# Patient Record
Sex: Female | Born: 1945
Health system: Southern US, Community
[De-identification: ages and names within clinical notes are randomized; demographics above are authoritative.]

## PROBLEM LIST (undated history)

## (undated) DIAGNOSIS — I10 Essential (primary) hypertension: Secondary | ICD-10-CM

## (undated) DIAGNOSIS — M1990 Unspecified osteoarthritis, unspecified site: Secondary | ICD-10-CM

## (undated) DIAGNOSIS — I890 Lymphedema, not elsewhere classified: Secondary | ICD-10-CM

## (undated) DIAGNOSIS — J449 Chronic obstructive pulmonary disease, unspecified: Secondary | ICD-10-CM

## (undated) DIAGNOSIS — I272 Pulmonary hypertension, unspecified: Secondary | ICD-10-CM

## (undated) HISTORY — DX: Lymphedema, not elsewhere classified: I89.0

## (undated) HISTORY — PX: PARS PLANA VITRECTOMY W/ REPAIR OF MACULAR HOLE: SHX2170

## (undated) HISTORY — PX: TUBAL LIGATION: SHX77

## (undated) HISTORY — PX: OTHER SURGICAL HISTORY: SHX169

---

## 2005-01-14 ENCOUNTER — Ambulatory Visit: Payer: Self-pay | Admitting: Unknown Physician Specialty

## 2006-02-24 ENCOUNTER — Ambulatory Visit: Payer: Self-pay | Admitting: Unknown Physician Specialty

## 2007-03-25 ENCOUNTER — Ambulatory Visit: Payer: Self-pay | Admitting: Unknown Physician Specialty

## 2007-11-12 ENCOUNTER — Emergency Department: Payer: Self-pay

## 2007-11-12 ENCOUNTER — Other Ambulatory Visit: Payer: Self-pay

## 2007-11-12 ENCOUNTER — Inpatient Hospital Stay: Payer: Self-pay | Admitting: Internal Medicine

## 2008-04-20 ENCOUNTER — Ambulatory Visit: Payer: Self-pay | Admitting: Unknown Physician Specialty

## 2008-10-14 ENCOUNTER — Emergency Department: Payer: Self-pay | Admitting: Unknown Physician Specialty

## 2009-06-26 ENCOUNTER — Ambulatory Visit: Payer: Self-pay | Admitting: Unknown Physician Specialty

## 2009-12-14 ENCOUNTER — Emergency Department: Payer: Self-pay | Admitting: Unknown Physician Specialty

## 2010-09-05 ENCOUNTER — Ambulatory Visit: Payer: Self-pay | Admitting: Unknown Physician Specialty

## 2011-01-07 ENCOUNTER — Emergency Department: Payer: Self-pay | Admitting: *Deleted

## 2011-02-24 ENCOUNTER — Emergency Department: Payer: Self-pay | Admitting: Emergency Medicine

## 2011-04-30 ENCOUNTER — Ambulatory Visit: Payer: Self-pay | Admitting: Urology

## 2011-05-08 ENCOUNTER — Ambulatory Visit: Payer: Self-pay | Admitting: Urology

## 2011-09-08 ENCOUNTER — Ambulatory Visit: Payer: Self-pay | Admitting: Internal Medicine

## 2012-10-05 ENCOUNTER — Ambulatory Visit: Payer: Self-pay | Admitting: Physician Assistant

## 2012-10-14 ENCOUNTER — Ambulatory Visit: Payer: Self-pay | Admitting: Physician Assistant

## 2013-04-18 ENCOUNTER — Ambulatory Visit: Payer: Self-pay | Admitting: Physician Assistant

## 2013-11-28 ENCOUNTER — Ambulatory Visit: Payer: Self-pay | Admitting: Physician Assistant

## 2013-12-01 ENCOUNTER — Ambulatory Visit: Payer: Self-pay | Admitting: Physician Assistant

## 2014-04-12 ENCOUNTER — Inpatient Hospital Stay: Payer: Self-pay | Admitting: Internal Medicine

## 2014-04-12 LAB — CBC
HCT: 41.1 % (ref 35.0–47.0)
HGB: 13.3 g/dL (ref 12.0–16.0)
MCH: 29 pg (ref 26.0–34.0)
MCHC: 32.4 g/dL (ref 32.0–36.0)
MCV: 89 fL (ref 80–100)
PLATELETS: 196 10*3/uL (ref 150–440)
RBC: 4.59 10*6/uL (ref 3.80–5.20)
RDW: 13.7 % (ref 11.5–14.5)
WBC: 13.5 10*3/uL — ABNORMAL HIGH (ref 3.6–11.0)

## 2014-04-12 LAB — BASIC METABOLIC PANEL
Anion Gap: 5 — ABNORMAL LOW (ref 7–16)
BUN: 13 mg/dL (ref 7–18)
CREATININE: 0.79 mg/dL (ref 0.60–1.30)
Calcium, Total: 8.5 mg/dL (ref 8.5–10.1)
Chloride: 102 mmol/L (ref 98–107)
Co2: 35 mmol/L — ABNORMAL HIGH (ref 21–32)
EGFR (African American): >60
GLUCOSE: 131 mg/dL — AB (ref 65–99)
Osmolality: 285 (ref 275–301)
Potassium: 3.3 mmol/L — ABNORMAL LOW (ref 3.5–5.1)
Sodium: 142 mmol/L (ref 136–145)

## 2014-04-14 LAB — CBC WITH DIFFERENTIAL/PLATELET
BASOS ABS: 0 10*3/uL (ref 0.0–0.1)
BASOS PCT: 0.1 %
EOS ABS: 0 10*3/uL (ref 0.0–0.7)
Eosinophil %: 0.1 %
HCT: 42.1 % (ref 35.0–47.0)
HGB: 13.7 g/dL (ref 12.0–16.0)
Lymphocyte #: 0.7 10*3/uL — ABNORMAL LOW (ref 1.0–3.6)
Lymphocyte %: 6.4 %
MCH: 29.3 pg (ref 26.0–34.0)
MCHC: 32.5 g/dL (ref 32.0–36.0)
MCV: 90 fL (ref 80–100)
Monocyte #: 0.5 x10 3/mm (ref 0.2–0.9)
Monocyte %: 4.5 %
Neutrophil #: 9.3 10*3/uL — ABNORMAL HIGH (ref 1.4–6.5)
Neutrophil %: 88.9 %
Platelet: 240 10*3/uL (ref 150–440)
RBC: 4.68 10*6/uL (ref 3.80–5.20)
RDW: 13.5 % (ref 11.5–14.5)
WBC: 10.5 10*3/uL (ref 3.6–11.0)

## 2014-04-14 LAB — BASIC METABOLIC PANEL
Anion Gap: 7 (ref 7–16)
BUN: 17 mg/dL (ref 7–18)
CALCIUM: 9 mg/dL (ref 8.5–10.1)
CO2: 32 mmol/L (ref 21–32)
Chloride: 98 mmol/L (ref 98–107)
Creatinine: 0.94 mg/dL (ref 0.60–1.30)
EGFR (African American): >60
Glucose: 137 mg/dL — ABNORMAL HIGH (ref 65–99)
Osmolality: 278 (ref 275–301)
POTASSIUM: 4.3 mmol/L (ref 3.5–5.1)
SODIUM: 137 mmol/L (ref 136–145)

## 2014-04-14 LAB — MAGNESIUM: Magnesium: 2.1 mg/dL

## 2014-09-09 NOTE — Consult Note (Signed)
PATIENT NAME:  Sarah Mckee, Sarah Mckee MR#:  [id number] DATE OF BIRTH:  Oct 02, 1945  DATE OF CONSULTATION:  04/13/2014  REFERRING PHYSICIAN:  Susa GriffinsPadmaja Vasireddy, MD CONSULTING PHYSICIAN:  Illene LabradorJames P. Angie FavaHooten Jr., MD  CHIEF COMPLAINT: Fall with hip pain.   HISTORY OF PRESENT ILLNESS: The patient is a 69 year old female who was attempting to walk up a step when she lost her balance and fell backwards, landing on her buttocks and right side. She had onset of severe pain and was unable to stand or bear weight due to the pain. The patient denied any loss of consciousness. She did complain of some right shoulder pain but otherwise denied any other injury. Prior to the fall, she was ambulating with a cane.   PAST MEDICAL HISTORY: Chronic obstructive pulmonary disease, oxygen-dependent, hypertension, osteoporosis, vertebral compression fractures, obesity, hyperlipidemia, pulmonary hypertension.   PAST SURGICAL HISTORY: Tubal ligation and cholecystectomy.   ALLERGIES: CODEINE AND DEMEROL.   CURRENT MEDICATIONS: Tylenol 650 mg q.4h. p.r.n., Norco 5/325, 1 to 2 tablets p.o. q.4 hours p.r.n. pain, alprazolam 0.25 mg q.8 hours p.r.n. anxiety, alprazolam 0.25 mg b.i.d., calcium carbonate with vitamin D 1 tablet daily, Colace 100 mg p.o. b.i.d. p.r.n., Maxzide 25/37.5 mg 1 tablet daily, morphine 2 to 4 mg IV q.4h. p.r.n. severe pain, Zofran 4 mg IV q.4h. p.r.n. nausea, Senokot 1 tablet p.o. b.i.d. p.r.n. constipation, Spiriva HandiHaler 1 capsule inhalation daily, Senokot-S 1 tablet p.o. t.i.d., Advair Diskus 250/50, 1 puff b.i.d., Solu-Medrol 40 mg IV q.6h., MiraLax powder 17 grams daily.   FAMILY HISTORY: Positive for diabetes, prostate cancer, and Hodgkin's lymphoma.   SOCIAL HISTORY: The patient has a previous history of cigarette smoking, but apparently stopped smoking several years ago. She denies any alcohol or illicit drug use. She is married and lives with her husband. She was ambulatory with a cane prior to this fall.    REVIEW OF SYSTEMS: CONSTITUTIONAL: No fevers, chills or weight loss. She does report some generalized weakness.  HEENT: No change in vision, hearing. RESPIRATORY:   O2 dependence with occasional shortness of breath/dyspnea on exertion.  CARDIOVASCULAR: No chest pain or palpitations.  GASTROINTESTINAL:  Some nausea since admission felt to be secondary to the pain medication. No vomiting or abdominal pain.  GENITOURINARY:  No dysuria or hematuria.  SKIN: No apparent rashes or skin breakdown.  MUSCULOSKELETAL: Right shoulder pain and "hip" pain bilaterally.  NEUROLOGIC: No numbness or ridiculer symptoms.   PHYSICAL EXAMINATION:  GENERAL: The patient is a well-developed, well-nourished female seen lying supine in the bed with oxygen via nasal cannula. There is audible wheezing noted.  HEENT: Atraumatic, normocephalic. Sclerae are clear. Oropharynx is clear with dry mucosa.  NECK: Supple, nontender, with good range of motion.  LUNGS: Some increased respiratory effort. Bilateral wheezing is noted. No gross rhonchi.  CARDIAC: Regular rate and rhythm with normal S1, S2. No appreciable murmurs, gallops, or rubs. Pedal pulses are palpable bilaterally. Mild bilateral lower extremity edema.  ABDOMEN: Soft, nontender, nondistended. Active bowel sounds are noted.  MUSCULOSKELETAL: Examination the upper extremities is notable for some tenderness to palpation about the right shoulder. No ecchymosis or erythema. Skin is intact. The patient can actively elevate the arm above 90 degrees no significant pain with passive range of motion of the right shoulder. Strength with internal and external rotation of the shoulder against resistance is fair. Impingement test is equivocal. Good range of motion of the elbow is noted.  EXTREMITIES:  Examination of the lower extremities is notable for pain with  any attempt at active range of motion of either hip. No tenderness to palpation about the trochanteric region  bilaterally. Gentle passive range of motion of both hips is reasonably well-tolerated. No gross tenderness to palpation about either knee. No knee effusion.  NEUROLOGIC: Awake, alert and oriented. Sensory function is grossly intact. Motor strength is felt to be 5/5 with the exception of the right shoulder and bilateral hips, which was difficult to assess due to guarding and pain. Good motor coordination is appreciated. No clonus or tremor.   X-RAYS: Radiographs of the right shoulder from [facility] performed yesterday show no evidence of fracture or dislocation. Relative osteopenia is noted. No significant soft tissue calcification about the shoulder.   Radiographs of the pelvis and right hip from yesterday were also reviewed. There does appear to be a fracture of the inferior pubic ramus on the right and the superior pubic ramus on the left.   CT SCAN:  CT scan of the pelvis also obtained yesterday was reviewed. This confirms evidence of fracture to the right inferior pubic ramus and left superior pubic ramus. Some change was noted to both femoral heads consistent with avascular necrosis without collapse.   IMPRESSION:  1. Right inferior pubic ramus fracture.  2. Left superior pubic ramus fracture.  3. Right shoulder contusion.   PLAN: Findings were discussed in detail with the patient. I agree with plans for physical therapy. I have spoken to the physical therapist about treatment goals. Possible need for placement with rehab was also discussed given the patient's status as well as resources at home. Continue with pain control as already ordered.     ____________________________ Illene Labrador. Angie Fava., MD jph:kl D: 04/13/2014 09:52:04 ET T: 04/13/2014 12:33:00 ET JOB#: [id number]  cc: Fayrene Fearing P. Angie Fava., MD, <Dictator> Illene Labrador Angie Fava MD ELECTRONICALLY SIGNED 04/21/2014 12:53

## 2014-09-09 NOTE — Consult Note (Signed)
Brief Consult Note: Diagnosis: Right inferior ramus & left superior ramus fractures.   Patient was seen by consultant.   Consult note dictated.   Comments: Agree with PT. I spoke to physical therapist about treatment goals. Pain management as ordered.  Electronic Signatures: Donato HeinzHooten, James P (MD)  (Signed [phone]-Nov-15 09:39)  Authored: Brief Consult Note   Last Updated: 26-Nov-15 09:39 by Donato HeinzHooten, James P (MD)

## 2014-09-09 NOTE — Discharge Summary (Signed)
PATIENT NAME:  Sarah Mckee, Sarah Mckee MR#:  [id number] DATE OF BIRTH:  [birth year]  DATE OF ADMISSION:  04/12/2014 DATE OF DISCHARGE:  04/17/2014  ADMITTING DIAGNOSIS: Pelvic fracture.   DISCHARGE DIAGNOSES: 1.  Acute on chronic respiratory failure with hypoxia.  2.  Chronic obstructive pulmonary disease exacerbation.  3.  Acute bronchitis.  4.  Pelvic fracture.  5.  Hypokalemia.  6.  Sinus tachycardia due to hypoxia. 7.  History of chronic obstructive pulmonary disease, chronic respiratory failure, on 2.5 liters of oxygen through nasal cannula at home.  8.  Osteoporosis. 9.  Obesity.  10.  Hypertension.   DISCHARGE CONDITION: Stable.   DISCHARGE MEDICATIONS: The patient is to continue: 1.  ProAir HFA 2 puffs every 4 to 6 hours as needed. 2.  HCTZ/triamterene 25/37.5 mg 1 tablet once daily. 3.  Calcium with vitamin D 600/200 one tablet 3 times daily. 4.  Ibuprofen 800 mg 1 to 2 times daily. 5.  Spiriva Respimat once daily. 6.  Vitamin D 1000 units once daily. 7.  Xanax 0.25 mg twice daily and 0.25 mg 3 times daily as needed. 8.  Tramadol 50 mg every 4 hours as needed.  9.  Albuterol 3 mL every 4 hours. 10.  Albuterol 3 mL every 2 hours as needed.  11.  Prednisone taper 60 mg p.o. once on the 1st of December 2015 then taper by 10 mg every 2 days until stopped.  12.  Levaquin 500 mg p.o. once daily for 7 days.  13.  Tylenol 650 mg every 4 hours as needed.  14.  Calcium carbonate 500 mg 2 tablets 4 times daily as needed.  15.  Advair Diskus 250/50 one puff twice daily.  16.  Senna 1 tablet twice daily as needed.  17.  Docusate sodium 100 mg twice daily as needed.   NOTE: The patient is not to take potassium supplements unless recommended by primary care physician.   HOME OXYGEN: With portable tank at 2 liters of oxygen through nasal cannula to keep pulse oximetry at around 90 to 92% at rest as well as on exertion.   DIET: 2 grams salt, mechanical soft.   ACTIVITY LIMITATIONS: As  tolerated.   REFERRALS: To physical therapy.   DISCHARGE FOLLOWUP: Follow-up appointment with Ms. McLaughlin in 2 days after discharge.  CONSULTANTS: Care management, social work.   RADIOLOGIC STUDIES: Right shoulder x-ray, complete x-ray, on 25th of November 2015, showed osteopenia but no acute osseous finding.   Sacrum and coccyx x-ray 25th of November 2015 revealed a subtle fracture of left superior pubic ramus, subtle fracture of right inferior pelvic ramus cannot be excluded, subtle fracture of distal sacrum cannot be excluded, changes suggesting avascular necrosis of both hips, degenerative changes in lumbar spine, both SI joints and both hips, osteopenia and atherosclerotic vascular disease.  Right hip complete x-ray, 25th of November 2015, showed suspected acute nondisplaced pubic rami fractures as described. Could further be evaluated by CT scan if warranted. Sacrum and sacroiliac joints appear intact. No evidence of proximal femur fracture or dislocation. Chronic bilateral femoral head avascular necrosis.   CT scan of pelvis without contrast, 25th of November 2015, revealing left superior pubic ramus fracture, pre-vesical hematoma likely related to pubic ramus fracture. Atherosclerotic disease.  Chest x-ray, portable single view, 27th of November 2015, revealing COPD, coarse interstitial lung markings that may reflect smoking history, bronchitic change or pulmonary fibrotic process. There is no evidence of CHF or alveolar pneumonia.   HISTORY AND HOSPITAL COURSE:  The patient is a 69 year old Caucasian female with past medical history significant for history of COPD and chronic respiratory failure who presented to the hospital with complaints of fall and hip pain. Please refer to Dr. Clarita Leber admission note on the 25th of November 2015.   On arrival to the hospital, the patient's temperature was 97.5, pulse was 116, blood pressure 114/56, and pulse oximetry was 97% on 2.5 liters of  oxygen through nasal cannula. Respiration rate was 18. Her physical examination was unremarkable. She has pain with range of motion of the left leg, causing pain in the hip. Otherwise no significant abnormalities were found.   The patient's lab data done on arrival to the hospital revealed mildly low potassium level of 3.3, bicarbonate level, CO2 elevated to 35, glucose level 131. Otherwise BMP was unremarkable. White blood cell count was also elevated at 13.5, hemoglobin was 13.3 and platelet count was 196,000. Differential was not done.   The patient's x-ray did not show any significant abnormalities, except of pubic ramus fracture.   CT scan of pelvis revealed left superior pubic ramus fracture as well as prevesical hematoma related to pubic ramus fracture.   The patient was admitted to the hospital for further evaluation. She was initiated on pain medications and her condition somewhat improved with physical therapy. She is felt to benefit from physical therapy in the skilled nursing facility, rehabilitation facility where she will be discharged today on the 30th of November 2015.   In regards to her respiratory failure, the patient was given pain medication and her oxygenation significantly declined. O2 sats were as low as 90% on 5 liters of oxygen through nasal cannula on admission, 26th of November 2015. It was felt that the patient could have underlying COPD exacerbation and steroids were initiated IV. The patient was also started on antibiotic therapy today, on the 30th of November 2015, to be continued for 7 days to complete course. She is to continue oxygen therapy. She was weaned down to her usual chronic 02 doses, 2.5 liters of oxygen through nasal cannula. Her O2 sats were 93% on the day of discharge, 30th of November 2015. It is recommended to continue her on current therapy with antibiotic, a steroid taper, as well as inhalation therapy and follow her oxygenation.   In regards to sinus  tachycardia, it was felt the patient's sinus tachycardia could have been related to her pain as well as possibly mild COPD exacerbation and medications we gave her during COPD exacerbation, such as inhalers. On the day of discharge, the patient's heart rate remains in 100s, but it is stable.   In regards to hypokalemia, this was supplemented orally and her potassium level normalized.   For her chronic medical problems such as osteoporosis, obesity, as well as hypertension the patient is to continue outpatient management. No changes were made.   The patient is being discharged in stable condition with the above-mentioned medications and follow-up.   On the day of discharge, temperature was 97.9, pulse was fluctuating between 101 to 110, respiration rate 18 to 20, blood pressure 138/81, and saturation was 93% on 2.5 liters of oxygen through nasal cannula at rest.   TIME SPENT: 40 minutes.  ____________________________ Katharina Caper, MD rv:sb D: 04/17/2014 13:15:17 ET T: 04/17/2014 13:48:27 ET JOB#: [id number]  cc: Katharina Caper, MD, <Dictator> Miriam "Mimi" Merlinda Frederick, PA-C Lonie Rummell MD ELECTRONICALLY SIGNED 04/25/2014 12:05

## 2014-09-09 NOTE — H&P (Signed)
PATIENT NAME:  Sarah Mckee, MAIDEN MR#:  [id number] DATE OF BIRTH:  05-25-1945  DATE OF ADMISSION:  04/12/2014  PRIMARY CARE PHYSICIAN:  Maurine Minister, PA-C   REFERRING PHYSICIAN:  Eartha Inch. York Cerise, MD  CHIEF COMPLAINT:  Fall, hip pain.   HISTORY OF PRESENT ILLNESS:  Sarah Mckee is a 69 year old female with history of COPD, oxygen dependent, hypertension, hyperlipidemia, and pulmonary hypertension, who comes to the Emergency Department after having a fall. The patient missed a step and walked backwards in order to balance herself, fell down to the floor. She started to experience severe pain and came to the Emergency Department. Workup in the Emergency Department:  The patient was found to have left superior pubic ramus fracture and prevesical hematoma likely related to the pubic ramus fracture. The patient was unable to walk in the Emergency Department. Concerning this, the decision is made to admit the patient for further pain control as well as physical therapy and consult and orthopedic consult. The patient did not have any fever and did not have any loss of consciousness.   PAST MEDICAL HISTORY: 1.  COPD.  2.  Osteoporosis.  3.  Several spontaneous rib fractures.  4.  Compression fractures of the thoracic spine.  5.  Obesity.  6.  Hypertension.  7.  Osteoporosis.   PAST SURGICAL HISTORY: 1.  Tubal ligation.  2.  Cholecystectomy.   ALLERGIES:  CODEINE AND DEMEROL.  HOME MEDICATIONS: 1.  Xanax 0.25 mg 2 times a day scheduled and 3 times a day as needed.  2.  Vitamin D3 one tablet once a day.  3.  Tramadol 50 mg 2 times a day.  4.  Spiriva 2 puffs once a day.  5.  ProAir 2 puffs every 4 to 6 hours as needed.  6.  Klor-Con 20 mEq once a day.  7.  Ibuprofen 800 mg 1 to 2 tablets a day.  8.  Hydrochlorothiazide and triamterene 1 tablet daily.  9.  Calcium with vitamin D 3 times a day.  10.  Albuterol every 6 hours as needed.   SOCIAL HISTORY:  Quit smoking a few years back. Denies  drinking alcohol or using illicit drugs. Married and lives with her husband.   FAMILY HISTORY:  Positive for diabetes mellitus. Father had prostate cancer. One of the daughters had Hodgkin lymphoma.   REVIEW OF SYSTEMS: CONSTITUTIONAL:  Experiencing generalized weakness.  EYES:  No change in vision.  EARS, NOSE, AND THROAT:  No change in hearing.  RESPIRATORY:  No cough or shortness of breath.  CARDIOVASCULAR:  No chest pain or palpations.  GASTROINTESTINAL:  No nausea, vomiting, or abdominal pain.  GENITOURINARY:  No dysuria or hematuria.  HEMATOLOGIC:  No easy bruising or bleeding.  SKIN:  No rash or lesions.  MUSCULOSKELETAL:  Has pain in the hip.  NEUROLOGIC:  No weakness or numbness in any part of the body.  SKIN:  No rash or lesions.   PHYSICAL EXAMINATION: GENERAL:  This is a well-built, well-nourished, age-appropriate female lying down in the bed, not in distress.  VITAL SIGNS:  Temperature 97.5, pulse 116, blood pressure 114/56, respiratory rate 18, oxygen saturation 97% on 2.5 liters of oxygen.  HEENT:  Head is normocephalic and atraumatic. There is no scleral icterus. Conjunctivae are normal. Pupils are equal and react to light. Mucous membranes are moist. No pharyngeal erythema.  NECK:  Supple. No lymphadenopathy. No JVD. No carotid bruit.  CHEST:  Has no focal tenderness.  LUNGS:  Bilaterally clear to  auscultation.  HEART:  S1 and S2, regular. No murmurs are heard.  ABDOMEN:  Bowel sounds present. Soft, nontender, nondistended. No hepatosplenomegaly.  EXTREMITIES:  No pedal edema. Pulses are 2+. She has painful range of motion in the left leg causing the pain in the hip.  SKIN:  No rash or lesions.  NEUROLOGIC:  No weakness or numbness in any part of the body.   LABORATORY AND RADIOGRAPHIC DATA:  CBC:  WBC 13.5, hemoglobin 13, platelet count 196. BMP:  Sodium 131, potassium 3.0, the rest of all the values are within normal limits. CT of the pelvis without contrast:  Left  superior pubic ramus fracture, prevesical hematoma likely related to the pubic ramus fracture.   ASSESSMENT AND PLAN:  Sarah Mckee is a 69 year old female who comes to the Emergency Department with a fall and sustaining pubic ramus fracture.   1.  Pelvis fracture. The patient is admitted to a medical bed. Continue pain management as needed. We will involve physical therapy in the morning. Consult orthopedic surgery.  2.  Chronic obstructive pulmonary disease. No current issues. Continue with oxygen and breathing treatments as needed.  3.  Hyponatremia secondary to hydrochlorothiazide. We will hold the hydrochlorothiazide.  4.  Hypokalemia. We will replace by mouth.  5.  Keep the patient on deep vein thrombosis prophylaxis with Lovenox.   TIME SPENT:  55 minutes.     ____________________________ Susa GriffinsPadmaja Aimee Heldman, MD pv:nb D: 04/13/2014 00:47:02 ET T: 04/13/2014 01:59:48 ET JOB#: [id number]  cc: Susa GriffinsPadmaja Zurri Rudden, MD, <Dictator> Susa GriffinsPADMAJA Aven Christen MD ELECTRONICALLY SIGNED 04/14/2014 22:38

## 2014-11-18 ENCOUNTER — Emergency Department: Payer: PPO

## 2014-11-18 ENCOUNTER — Emergency Department
Admission: EM | Admit: 2014-11-18 | Discharge: 2014-11-18 | Disposition: A | Discharge status: Home / Self Care | Payer: PPO | Attending: Emergency Medicine | Admitting: Emergency Medicine

## 2014-11-18 DIAGNOSIS — R1010 Upper abdominal pain, unspecified: Secondary | ICD-10-CM | POA: Diagnosis present

## 2014-11-18 DIAGNOSIS — J439 Emphysema, unspecified: Secondary | ICD-10-CM | POA: Insufficient documentation

## 2014-11-18 DIAGNOSIS — F419 Anxiety disorder, unspecified: Secondary | ICD-10-CM | POA: Insufficient documentation

## 2014-11-18 DIAGNOSIS — I10 Essential (primary) hypertension: Secondary | ICD-10-CM | POA: Diagnosis not present

## 2014-11-18 DIAGNOSIS — R109 Unspecified abdominal pain: Secondary | ICD-10-CM

## 2014-11-18 DIAGNOSIS — Z9981 Dependence on supplemental oxygen: Secondary | ICD-10-CM | POA: Insufficient documentation

## 2014-11-18 DIAGNOSIS — K8020 Calculus of gallbladder without cholecystitis without obstruction: Secondary | ICD-10-CM | POA: Insufficient documentation

## 2014-11-18 DIAGNOSIS — R079 Chest pain, unspecified: Secondary | ICD-10-CM | POA: Insufficient documentation

## 2014-11-18 DIAGNOSIS — M8008XA Age-related osteoporosis with current pathological fracture, vertebra(e), initial encounter for fracture: Secondary | ICD-10-CM | POA: Insufficient documentation

## 2014-11-18 HISTORY — DX: Essential (primary) hypertension: I10

## 2014-11-18 HISTORY — DX: Unspecified osteoarthritis, unspecified site: M19.90

## 2014-11-18 HISTORY — DX: Chronic obstructive pulmonary disease, unspecified: J44.9

## 2014-11-18 LAB — COMPREHENSIVE METABOLIC PANEL
ALK PHOS: 54 U/L (ref 38–126)
ALT: 12 U/L — ABNORMAL LOW (ref 14–54)
ANION GAP: 10 (ref 5–15)
AST: 19 U/L (ref 15–41)
Albumin: 3.8 g/dL (ref 3.5–5.0)
BUN: 11 mg/dL (ref 6–20)
CALCIUM: 9 mg/dL (ref 8.9–10.3)
CO2: 34 mmol/L — ABNORMAL HIGH (ref 22–32)
Chloride: 93 mmol/L — ABNORMAL LOW (ref 101–111)
Creatinine, Ser: 0.78 mg/dL (ref 0.44–1.00)
GLUCOSE: 129 mg/dL — AB (ref 65–99)
Potassium: 3.6 mmol/L (ref 3.5–5.1)
SODIUM: 137 mmol/L (ref 135–145)
Total Bilirubin: 0.9 mg/dL (ref 0.3–1.2)
Total Protein: 7 g/dL (ref 6.5–8.1)

## 2014-11-18 LAB — LIPASE, BLOOD: Lipase: 30 U/L (ref 22–51)

## 2014-11-18 LAB — CBC
HCT: 40.8 % (ref 35.0–47.0)
Hemoglobin: 13.6 g/dL (ref 12.0–16.0)
MCH: 29.1 pg (ref 26.0–34.0)
MCHC: 33.2 g/dL (ref 32.0–36.0)
MCV: 87.7 fL (ref 80.0–100.0)
PLATELETS: 213 10*3/uL (ref 150–440)
RBC: 4.65 MIL/uL (ref 3.80–5.20)
RDW: 14 % (ref 11.5–14.5)
WBC: 8.8 10*3/uL (ref 3.6–11.0)

## 2014-11-18 LAB — TROPONIN I: Troponin I: <0.03 ng/mL (ref <0.031)

## 2014-11-18 MED ORDER — ACETAMINOPHEN 500 MG PO TABS
ORAL_TABLET | ORAL | Status: AC
  Administered 2014-11-18: 1000 mg via ORAL
  Filled 2014-11-18: qty 2

## 2014-11-18 MED ORDER — GI COCKTAIL ~~LOC~~
ORAL | Status: AC
  Filled 2014-11-18: qty 30

## 2014-11-18 MED ORDER — IOHEXOL 240 MG/ML SOLN
25.0000 mL | Freq: Once | INTRAMUSCULAR | Status: AC | PRN
  Administered 2014-11-18: 25 mL via ORAL

## 2014-11-18 MED ORDER — TRAMADOL HCL 50 MG PO TABS
50.0000 mg | ORAL_TABLET | Freq: Once | ORAL | Status: AC
  Administered 2014-11-18: 50 mg via ORAL

## 2014-11-18 MED ORDER — TRAMADOL HCL 50 MG PO TABS
ORAL_TABLET | ORAL | Status: AC
  Administered 2014-11-18: 50 mg via ORAL
  Filled 2014-11-18: qty 1

## 2014-11-18 MED ORDER — IOHEXOL 300 MG/ML  SOLN
100.0000 mL | Freq: Once | INTRAMUSCULAR | Status: AC | PRN
  Administered 2014-11-18: 100 mL via INTRAVENOUS

## 2014-11-18 MED ORDER — GI COCKTAIL ~~LOC~~: 30.0000 mL | Freq: Once | ORAL | Status: DC

## 2014-11-18 MED ORDER — ACETAMINOPHEN 500 MG PO TABS
1000.0000 mg | ORAL_TABLET | Freq: Once | ORAL | Status: AC
Start: 2014-11-18 — End: 2014-11-18
  Administered 2014-11-18: 1000 mg via ORAL

## 2014-11-18 NOTE — ED Notes (Signed)
Pt reporting CP that radiates to back and shoulder that started 3 days ago. Pt reporting being diaphoretic and having nausea today.

## 2014-11-18 NOTE — ED Notes (Signed)
Patient transported to CT 

## 2014-11-18 NOTE — ED Provider Notes (Signed)
[facility] Emergency Department Provider Note  ____________________________________________  Time seen:  1455  I have reviewed the triage vital signs and the nursing notes.   HISTORY  Chief Complaint Chest Pain     HPI Sarah Mckee is a 69 y.o. female who reports she has had 3 days of discomfort in her chest radiating to her back. On further discussion her pain is primarily on the right side. It includes her upper abdomen. She tells me that at one point, when she was evaluated for a kidney stone, she was told she had a gallstone as well.  This pain has been fairly constant but does get worse when she eats. She's had some mild nausea. She has not had any fever, vomiting, or diarrhea.  Patient does have a notable history of COPD and osteoporosis. She had a pelvic fracture in November 2015.   Past Medical History  Diagnosis Date  . COPD (chronic obstructive pulmonary disease)     2L 02 at baseline  . Hypertension   . Arthritis     There are no active problems to display for this patient.   No past surgical history on file.  No current outpatient prescriptions on file.  Allergies Codeine; Demerol; Dilaudid; Morphine and related; and Oxycodone  Family History  Problem Relation Age of Onset  . Heart attack Mother   . Cancer Father   . Thyroid disease Father   . Diabetes Sister   . Diabetes Brother     Social History History  Substance Use Topics  . Smoking status: Former Smoker    Quit date: 05/20/2007  . Smokeless tobacco: Never Used  . Alcohol Use: No    Review of Systems  Constitutional: Negative for fever. ENT: Negative for sore throat. Cardiovascular: Positive for chest pain see history of present illness Respiratory: No change in respiratory status. She does have advanced COPD. She uses oxygen at home. Gastrointestinal: Chest pain appears to be more abdominal. See history of present illness. Genitourinary: Negative for  dysuria. Musculoskeletal: Patient denies acute pain, but she appears nervous that movements will hurt. She has a history of a pelvic fracture 8 months ago. Skin: Negative for rash. Neurological: Negative for headaches   10-point ROS otherwise negative.  ____________________________________________   PHYSICAL EXAM:  VITAL SIGNS: ED Triage Vitals  Enc Vitals Group     BP --      Pulse --      Resp --      Temp --      Temp src --      SpO2 --      Weight --      Height --      Head Cir --      Peak Flow --      Pain Score 11/18/14 1423 10     Pain Loc --      Pain Edu? --      Excl. in GC? --     Constitutional: Alert and oriented. No distress but patient appears a little bit anxious. ENT   Head: Normocephalic and atraumatic.   Nose: No congestion/rhinnorhea.   Mouth/Throat: Mucous membranes are moist. Cardiovascular: Normal rate, regular rhythm, no murmur noted Respiratory:  Normal respiratory effort, no tachypnea.    Breath sounds are clear and equal bilaterally.  Gastrointestinal: Soft with notable tenderness in the right upper quadrant. This appears to be a positive Murphy sign.. No distention.  Back: No muscle spasm, no tenderness, no CVA tenderness.  Musculoskeletal: No deformity noted. Nontender with normal range of motion in all extremities. Notable edema that appears chronic bilateral legs. Neurologic:  Normal speech and language. No gross focal neurologic deficits are appreciated.  Skin:  Skin is warm, dry. No rash noted. Psychiatric: Mood and affect are normal. Speech and behavior are normal.  ____________________________________________    LABS (pertinent positives/negatives)  CBC normal Metabolic panel: Overall within normal limits with a slightly high CO2 level of 34. LFTs are within normal limits. Lipase: Normal Troponin is negative.  ____________________________________________   EKG  ED ECG REPORT I, Herb Beltre W, the attending  physician, personally viewed and interpreted this ECG.   Date: 11/18/2014  EKG Time: 1429  Rate: 93  Rhythm: Normal sinus rhythm with incomplete right bundle branch block and left posterior fascicular block.  Axis: Right axis at 130.  Intervals: QTC of 482.  ST&T Change: None noted   ____________________________________________    RADIOLOGY  Ultrasound, right upper quadrant: IMPRESSION: Cholelithiasis without definite evidence acute cholecystitis.  Incomplete hepatic visualization  Chest x-ray: IMPRESSION: 1. Mild chronic peribronchial thickening noted, with findings of COPD. No acute cardiopulmonary process seen. 2. Nodular prominence of the left hilum appears to be stable from 2009 and likely reflects normal vasculature.  CT of chest and abdomen pelvis: IMPRESSION: Emphysema without evidence of acute or focal chest pathology otherwise.  Atherosclerosis of the aorta and its branch vessels including the coronary arteries.  Partial compression fractures of T3, T7, T11, L1 and L2. The T7 and T11 fractures can be established as old on the basis of previous studies. The other fractures have not been previously seen. I suspect that they are old, but cannot state that with certainty.  No acute organ pathology in the abdomen. There is a peripherally calcified structure in the gallbladder measuring 18 mm in diameter. I suspect this represents a gallstone, but it could represent regional calcification of the gallbladder wall. Gallbladder ultrasound would be suggested to evaluate this further. This could be done non emergently. There is no CT evidence of cholecystitis or obstruction.   ____________________________________________  ____________________________________________   INITIAL IMPRESSION / ASSESSMENT AND PLAN / ED COURSE  Pertinent labs & imaging results that were available during my care of the patient were reviewed by me and considered in my medical decision  making (see chart for details).  Patient presents with pain in the upper abdomen and chest area through to the back. She has exquisite tenderness in the right upper quadrant consistent with a possible biliary process. Will obtain an ultrasound to evaluate gallbladder. We will check a troponin level, although my suspicion for cardiac is low.  ----------------------------------------- 4:25 PM on 11/18/2014 -----------------------------------------  Patient is complaining of discomfort but appears to be in no acute distress at this time. Her ultrasound shows gallstones without inflammatory changes or fluid. The ultrasound specifically says that she has a negative sonographic Murphy sign, although I thought my exam certainly pointed towards biliary tenderness.  The troponin level is negative. Her EKG is okay and nondiagnostic.  We will obtain a chest x-ray due to her COPD and this chest pain abdominal pain that is a little bit unclear regarding the source. I still think this is primarily a biliary problem.  ----------------------------------------- 5:55 PM on 11/18/2014 -----------------------------------------  Chest x-ray is consistent with bronchitis and COPD. No acute changes.  Reassessment of patient finds her still uncomfortable with pain per meeting through the right chest through to the back. We'll obtain a CT scan through her  chest and abdomen.  ----------------------------------------- 9:47 PM on 11/18/2014 -----------------------------------------  CT scan does show numerous old compression fractures of the spine with a compression fracture at T3 that is new from prior imaging, but appears old without any signs of acute injury.  CT scan of the abdomen sees what is likely a gallstone but no other findings that would be worrisome for an acute event.  Patient has continued to have discomfort in the emergency department. Unfortunately, with her notable allergies and bad reactions to  narcotics, she cannot have any of those. She is also not supposed to have nonsteroidal anti-inflammatories. We have treated her with Tramadol. I will give her an additional tramadol now and Tylenol as well. The patient with likely some food and at this stage we think that would be fine.  I have spoke with the patient and her husband at length. This is a difficult situation for a woman who has ongoing health problems and edited ability to take medications to ease her discomfort. Overall she looks comfortable at this stage.  I will discharge her home to follow with her primary physician. ____________________________________________   FINAL CLINICAL IMPRESSION(S) / ED DIAGNOSES  Final diagnoses:  Abdominal pain  cholelithiasis Compression fractures of the thoracic spine, chronic Emphysema, chronic    Darien Ramus, MD 11/18/14 2157

## 2014-11-18 NOTE — ED Notes (Signed)
Patient continues to c/o pain however, is unable to take any opiates. Dr. Carollee MassedKaminski aware.

## 2014-11-18 NOTE — Discharge Instructions (Signed)
You do have gallstones your gallbladder. There is no sign of thickening or fluid or other signs of infection or inflammation, however with the tenderness you had in the right upper quadrant, I'm still concerned that the gallbladder may be causing the pain that you have had this evening. Your CT scan did show numerous old compression fractures. This also could be causing the discomfort you're having in the right chest into your back. Continue your tramadol. He may also take Tylenol. Follow-up with Dr. Mayo AoFlemming as scheduled and Ms. McLaughlin as needed. Return to the emergency department if you have uncontrolled pain, fever, or other urgent concerns.  Abdominal Pain Many things can cause abdominal pain. Usually, abdominal pain is not caused by a disease and will improve without treatment. It can often be observed and treated at home. Your health care provider will do a physical exam and possibly order blood tests and X-rays to help determine the seriousness of your pain. However, in many cases, more time must pass before a clear cause of the pain can be found. Before that point, your health care provider may not know if you need more testing or further treatment. HOME CARE INSTRUCTIONS  Monitor your abdominal pain for any changes. The following actions may help to alleviate any discomfort you are experiencing:  Only take over-the-counter or prescription medicines as directed by your health care provider.  Do not take laxatives unless directed to do so by your health care provider.  Try a clear liquid diet (broth, tea, or water) as directed by your health care provider. Slowly move to a bland diet as tolerated. SEEK MEDICAL CARE IF:  You have unexplained abdominal pain.  You have abdominal pain associated with nausea or diarrhea.  You have pain when you urinate or have a bowel movement.  You experience abdominal pain that wakes you in the night.  You have abdominal pain that is worsened or  improved by eating food.  You have abdominal pain that is worsened with eating fatty foods.  You have a fever. SEEK IMMEDIATE MEDICAL CARE IF:   Your pain does not go away within 2 hours.  You keep throwing up (vomiting).  Your pain is felt only in portions of the abdomen, such as the right side or the left lower portion of the abdomen.  You pass bloody or black tarry stools. MAKE SURE YOU:  Understand these instructions.   Will watch your condition.   Will get help right away if you are not doing well or get worse.  Document Released: 02/12/2005 Document Revised: 05/10/2013 Document Reviewed: 01/12/2013 [facility] Patient Information 2015 [employer], [place]. This information is not intended to replace advice given to you by your health care provider. Make sure you discuss any questions you have with your health care provider.

## 2014-12-02 ENCOUNTER — Emergency Department
Admission: EM | Admit: 2014-12-02 | Discharge: 2014-12-02 | Disposition: A | Discharge status: Home / Self Care | Payer: PPO | Attending: Emergency Medicine | Admitting: Emergency Medicine

## 2014-12-02 ENCOUNTER — Encounter: Payer: Self-pay | Admitting: Emergency Medicine

## 2014-12-02 ENCOUNTER — Other Ambulatory Visit: Payer: Self-pay

## 2014-12-02 DIAGNOSIS — K8020 Calculus of gallbladder without cholecystitis without obstruction: Secondary | ICD-10-CM | POA: Diagnosis not present

## 2014-12-02 DIAGNOSIS — Z87891 Personal history of nicotine dependence: Secondary | ICD-10-CM | POA: Insufficient documentation

## 2014-12-02 DIAGNOSIS — I10 Essential (primary) hypertension: Secondary | ICD-10-CM | POA: Diagnosis not present

## 2014-12-02 DIAGNOSIS — R1011 Right upper quadrant pain: Secondary | ICD-10-CM | POA: Diagnosis present

## 2014-12-02 LAB — CBC WITH DIFFERENTIAL/PLATELET
BASOS PCT: 0 %
Basophils Absolute: 0 10*3/uL (ref 0–0.1)
EOS ABS: 0.1 10*3/uL (ref 0–0.7)
Eosinophils Relative: 1 %
HEMATOCRIT: 42.6 % (ref 35.0–47.0)
Hemoglobin: 14.1 g/dL (ref 12.0–16.0)
LYMPHS PCT: 12 %
Lymphs Abs: 1.1 10*3/uL (ref 1.0–3.6)
MCH: 29 pg (ref 26.0–34.0)
MCHC: 33 g/dL (ref 32.0–36.0)
MCV: 87.9 fL (ref 80.0–100.0)
MONO ABS: 0.5 10*3/uL (ref 0.2–0.9)
Monocytes Relative: 6 %
Neutro Abs: 8 10*3/uL — ABNORMAL HIGH (ref 1.4–6.5)
Neutrophils Relative %: 81 %
PLATELETS: 241 10*3/uL (ref 150–440)
RBC: 4.85 MIL/uL (ref 3.80–5.20)
RDW: 13.7 % (ref 11.5–14.5)
WBC: 9.8 10*3/uL (ref 3.6–11.0)

## 2014-12-02 LAB — TROPONIN I: Troponin I: <0.03 ng/mL (ref <0.031)

## 2014-12-02 LAB — COMPREHENSIVE METABOLIC PANEL
ALT: 14 U/L (ref 14–54)
ANION GAP: 11 (ref 5–15)
AST: 18 U/L (ref 15–41)
Albumin: 3.9 g/dL (ref 3.5–5.0)
Alkaline Phosphatase: 55 U/L (ref 38–126)
BUN: 10 mg/dL (ref 6–20)
CHLORIDE: 92 mmol/L — AB (ref 101–111)
CO2: 34 mmol/L — AB (ref 22–32)
CREATININE: 0.75 mg/dL (ref 0.44–1.00)
Calcium: 9.3 mg/dL (ref 8.9–10.3)
GFR calc Af Amer: >60 mL/min (ref >60)
Glucose, Bld: 131 mg/dL — ABNORMAL HIGH (ref 65–99)
Potassium: 3.5 mmol/L (ref 3.5–5.1)
Sodium: 137 mmol/L (ref 135–145)
Total Bilirubin: 0.9 mg/dL (ref 0.3–1.2)
Total Protein: 7.4 g/dL (ref 6.5–8.1)

## 2014-12-02 LAB — LIPASE, BLOOD: LIPASE: 14 U/L — AB (ref 22–51)

## 2014-12-02 MED ORDER — ONDANSETRON HCL 4 MG/2ML IJ SOLN
4.0000 mg | Freq: Once | INTRAMUSCULAR | Status: AC
  Administered 2014-12-02: 4 mg via INTRAVENOUS
  Filled 2014-12-02: qty 2

## 2014-12-02 MED ORDER — KETOROLAC TROMETHAMINE 30 MG/ML IJ SOLN
15.0000 mg | Freq: Once | INTRAMUSCULAR | Status: AC
  Administered 2014-12-02: 15 mg via INTRAVENOUS
  Filled 2014-12-02: qty 1

## 2014-12-02 MED ORDER — HYDROMORPHONE HCL 1 MG/ML IJ SOLN
0.5000 mg | Freq: Once | INTRAMUSCULAR | Status: AC
  Administered 2014-12-02: 0.5 mg via INTRAVENOUS
  Filled 2014-12-02: qty 1

## 2014-12-02 MED ORDER — FENTANYL CITRATE (PF) 100 MCG/2ML IJ SOLN
50.0000 ug | Freq: Once | INTRAMUSCULAR | Status: AC
  Administered 2014-12-02: 50 ug via INTRAVENOUS
  Filled 2014-12-02: qty 2

## 2014-12-02 MED ORDER — HYDROMORPHONE HCL 2 MG PO TABS: 2.0000 mg | ORAL_TABLET | Freq: Two times a day (BID) | ORAL | Status: DC | PRN

## 2014-12-02 MED ORDER — SODIUM CHLORIDE 0.9 % IV SOLN
Freq: Once | INTRAVENOUS | Status: AC
  Administered 2014-12-02: 20:00:00 via INTRAVENOUS

## 2014-12-02 NOTE — ED Notes (Signed)
C/o chest pain, to triage room for EKG

## 2014-12-02 NOTE — Discharge Instructions (Signed)
Cholelithiasis °Cholelithiasis (also called gallstones) is a form of gallbladder disease in which gallstones form in your gallbladder. The gallbladder is an organ that stores bile made in the liver, which helps digest fats. Gallstones begin as small crystals and slowly grow into stones. Gallstone pain occurs when the gallbladder spasms and a gallstone is blocking the duct. Pain can also occur when a stone passes out of the duct.  °RISK FACTORS °· Being female.   °· Having multiple pregnancies. Health care providers sometimes advise removing diseased gallbladders before future pregnancies.   °· Being obese. °· Eating a diet heavy in fried foods and fat.   °· Being older than 60 years and increasing age.   °· Prolonged use of medicines containing female hormones.   °· Having diabetes mellitus.   °· Rapidly losing weight.   °· Having a family history of gallstones (heredity).   °SYMPTOMS °· Nausea.   °· Vomiting. °· Abdominal pain.   °· Yellowing of the skin (jaundice).   °· Sudden pain. It may persist from several minutes to several hours. °· Fever.   °· Tenderness to the touch.  °In some cases, when gallstones do not move into the bile duct, people have no pain or symptoms. These are called "silent" gallstones.  °TREATMENT °Silent gallstones do not need treatment. In severe cases, emergency surgery may be required. Options for treatment include: °· Surgery to remove the gallbladder. This is the most common treatment. °· Medicines. These do not always work and may take 6-12 months or more to work. °· Shock wave treatment (extracorporeal biliary lithotripsy). In this treatment an ultrasound machine sends shock waves to the gallbladder to break gallstones into smaller pieces that can pass into the intestines or be dissolved by medicine. °HOME CARE INSTRUCTIONS  °· Only take over-the-counter or prescription medicines for pain, discomfort, or fever as directed by your health care provider.   °· Follow a low-fat diet until  seen again by your health care provider. Fat causes the gallbladder to contract, which can result in pain.   °· Follow up with your health care provider as directed. Attacks are almost always recurrent and surgery is usually required for permanent treatment.   °SEEK IMMEDIATE MEDICAL CARE IF:  °· Your pain increases and is not controlled by medicines.   °· You have a fever or persistent symptoms for more than 2-3 days.   °· You have a fever and your symptoms suddenly get worse.   °· You have persistent nausea and vomiting.   °MAKE SURE YOU:  °· Understand these instructions. °· Will watch your condition. °· Will get help right away if you are not doing well or get worse. °Document Released: 05/01/2005 Document Revised: 01/05/2013 Document Reviewed: 10/27/2012 °[employer]® Patient Information ©2015 [employer]. This information is not intended to replace advice given to you by your health care provider. Make sure you discuss any questions you have with your health care provider. ° °

## 2014-12-02 NOTE — ED Notes (Signed)
Patient reports several day history of severe gall bladder pain. Patient reports being seen here for same on the 2nd and was diagnosed with gall bladder disease but that she had to follow up with Dr. Meredeth IdeFleming. Reports that she has had that appointment but that she has not seen a surgeon yet. Patient reports that today the pain has become unbearable and that the medications are not working.

## 2014-12-02 NOTE — ED Provider Notes (Signed)
[facility] Emergency [facility] Provider Note     Time seen: ----------------------------------------- 7:40 PM on 10-20-2014 -----------------------------------------    I have reviewed the triage vital signs and the nursing notes.   HISTORY  Chief Complaint Abdominal Pain    HPI Sarah Mckee is a 69 y.o. female who presents ER with right upper quadrant pain. Patient states she has a several day history of intermittent pain, was recently seen here on July 2 was diagnosed with gallbladder disease and a stone, but there was no infection at that time. Patient has been followed up with her pulmonologist who has cleared her for surgery with a spinal block. She states the pain is becoming unbearable and medications are not working. Pain is severe, in the right upper quadrant. Dull. Nothing makes it better or worse.   Past Medical History  Diagnosis Date  . COPD (chronic obstructive pulmonary disease)     2L 02 at baseline  . Hypertension   . Arthritis     There are no active problems to display for this patient.   History reviewed. No pertinent past surgical history.  Allergies Codeine; Demerol; Dilaudid; Morphine and related; and Oxycodone  Social History History  Substance Use Topics  . Smoking status: Former Smoker    Quit date: 05/20/2007  . Smokeless tobacco: Never Used  . Alcohol Use: No    Review of Systems Constitutional: Negative for fever. Eyes: Negative for visual changes. ENT: Negative for sore throat. Cardiovascular: Negative for chest pain. Respiratory: Negative for shortness of breath. Gastrointestinal: Positive for abdominal pain and nausea Genitourinary: Negative for dysuria. Musculoskeletal: Negative for back pain. Skin: Negative for rash. Neurological: Negative for headaches, focal weakness or numbness.  10-point ROS otherwise negative.  ____________________________________________   PHYSICAL EXAM:  VITAL  SIGNS: ED Triage Vitals  Enc Vitals Group     BP 12/02/14 1617 121/64 mmHg     Pulse Rate 12/02/14 1617 93     Resp 12/02/14 1617 18     Temp 12/02/14 1617 97.7 F (36.5 C)     Temp Source 12/02/14 1617 Oral     SpO2 12/02/14 1617 96 %     Weight 12/02/14 1617 192 lb (87.091 kg)     Height 12/02/14 1617 5\' 2"  (1.575 m)     Head Cir --      Peak Flow --      Pain Score 12/02/14 1625 10     Pain Loc --      Pain Edu? --      Excl. in GC? --     Constitutional: Alert and oriented. Well appearing mild distress Eyes: Conjunctivae are normal. PERRL. Normal extraocular movements. ENT   Head: Normocephalic and atraumatic.   Nose: No congestion/rhinnorhea.   Mouth/Throat: Mucous membranes are moist.   Neck: No stridor. Hematological/Lymphatic/Immunilogical: No cervical lymphadenopathy. Cardiovascular: Normal rate, regular rhythm. Normal and symmetric distal pulses are present in all extremities. No murmurs, rubs, or gallops. Respiratory: Normal respiratory effort without tachypnea nor retractions. Breath sounds are clear and equal bilaterally. No wheezes/rales/rhonchi. Gastrointestinal: Right upper quadrant tenderness, positive Murphy sign, positive bowel sounds. Musculoskeletal: Nontender with normal range of motion in all extremities. No joint effusions.  No lower extremity tenderness nor edema. Neurologic:  Normal speech and language. No gross focal neurologic deficits are appreciated. Speech is normal. No gait instability. Skin:  Skin is warm, dry and intact. No rash noted. Psychiatric: Mood and affect are normal. Speech and behavior are normal. Patient exhibits  appropriate insight and judgment. ____________________________________________  EKG: Interpreted by me. Normal sinus rhythm with sinus arrhythmia, PR intervals normal, QRS with normal, QT interval is normal. No evidence of hypertrophy or acute infarction  ____________________________________________  ED  COURSE:  Pertinent labs & imaging results that were available during my care of the patient were reviewed by me and considered in my medical decision making (see chart for details). Patient received saline bolus as well as IV Zofran and morphine. We'll review old ultrasound and CT. ____________________________________________    LABS (pertinent positives/negatives)  Labs Reviewed  CBC WITH DIFFERENTIAL/PLATELET - Abnormal; Notable for the following:    Neutro Abs 8.0 (*)    All other components within normal limits  COMPREHENSIVE METABOLIC PANEL - Abnormal; Notable for the following:    Chloride 92 (*)    CO2 34 (*)    Glucose, Bld 131 (*)    All other components within normal limits  LIPASE, BLOOD - Abnormal; Notable for the following:    Lipase 14 (*)    All other components within normal limits  TROPONIN I    ____________________________________________  FINAL ASSESSMENT AND PLAN  Abdominal pain, gallstones  Plan: Patient with labs as dictated above. Will discuss with general surgery. Patient not a surgical candidate at this point due to the needs for anesthesia. Patient will be discharged after receiving IV Dilaudid here. She'll go home with same to use if tramadol is not helping her.   Emily Filbert, MD   Emily Filbert, MD 12/02/14 [phone]

## 2014-12-02 NOTE — ED Notes (Signed)
Pt reports she has been having issues with epigastric pain since the end of Junel Pt reports had ct scan and an ultrasound  on 7/2 and was told she had a gallstone. Over the last week she has had several "attacks" and today the pain was worse and worse nausea today.

## 2015-01-05 ENCOUNTER — Other Ambulatory Visit: Payer: Self-pay | Admitting: Physician Assistant

## 2015-01-05 DIAGNOSIS — Z1231 Encounter for screening mammogram for malignant neoplasm of breast: Secondary | ICD-10-CM

## 2015-01-10 ENCOUNTER — Ambulatory Visit
Admission: RE | Admit: 2015-01-10 | Discharge: 2015-01-10 | Disposition: A | Discharge status: Home / Self Care | Payer: PPO | Source: Ambulatory Visit | Attending: Physician Assistant | Admitting: Physician Assistant

## 2015-01-10 DIAGNOSIS — Z1231 Encounter for screening mammogram for malignant neoplasm of breast: Secondary | ICD-10-CM | POA: Diagnosis present

## 2015-07-30 DIAGNOSIS — M810 Age-related osteoporosis without current pathological fracture: Secondary | ICD-10-CM | POA: Diagnosis not present

## 2015-07-30 DIAGNOSIS — R609 Edema, unspecified: Secondary | ICD-10-CM | POA: Diagnosis not present

## 2015-07-30 DIAGNOSIS — I272 Other secondary pulmonary hypertension: Secondary | ICD-10-CM | POA: Diagnosis not present

## 2015-07-30 DIAGNOSIS — I10 Essential (primary) hypertension: Secondary | ICD-10-CM | POA: Diagnosis not present

## 2015-07-30 DIAGNOSIS — J449 Chronic obstructive pulmonary disease, unspecified: Secondary | ICD-10-CM | POA: Diagnosis not present

## 2015-08-11 ENCOUNTER — Emergency Department: Payer: PPO

## 2015-08-11 ENCOUNTER — Encounter: Payer: Self-pay | Admitting: Emergency Medicine

## 2015-08-11 ENCOUNTER — Inpatient Hospital Stay
Admission: EM | Admit: 2015-08-11 | Discharge: 2015-08-16 | DRG: 193 | Disposition: A | Discharge status: Home / Self Care | Payer: PPO | Attending: Internal Medicine | Admitting: Internal Medicine

## 2015-08-11 DIAGNOSIS — J9601 Acute respiratory failure with hypoxia: Secondary | ICD-10-CM

## 2015-08-11 DIAGNOSIS — R0689 Other abnormalities of breathing: Secondary | ICD-10-CM

## 2015-08-11 DIAGNOSIS — Z87891 Personal history of nicotine dependence: Secondary | ICD-10-CM | POA: Diagnosis not present

## 2015-08-11 DIAGNOSIS — J9621 Acute and chronic respiratory failure with hypoxia: Secondary | ICD-10-CM | POA: Diagnosis not present

## 2015-08-11 DIAGNOSIS — J111 Influenza due to unidentified influenza virus with other respiratory manifestations: Secondary | ICD-10-CM | POA: Diagnosis not present

## 2015-08-11 DIAGNOSIS — E876 Hypokalemia: Secondary | ICD-10-CM | POA: Diagnosis present

## 2015-08-11 DIAGNOSIS — Z886 Allergy status to analgesic agent status: Secondary | ICD-10-CM | POA: Diagnosis not present

## 2015-08-11 DIAGNOSIS — Z7951 Long term (current) use of inhaled steroids: Secondary | ICD-10-CM

## 2015-08-11 DIAGNOSIS — Z8249 Family history of ischemic heart disease and other diseases of the circulatory system: Secondary | ICD-10-CM

## 2015-08-11 DIAGNOSIS — J9690 Respiratory failure, unspecified, unspecified whether with hypoxia or hypercapnia: Secondary | ICD-10-CM

## 2015-08-11 DIAGNOSIS — Z809 Family history of malignant neoplasm, unspecified: Secondary | ICD-10-CM

## 2015-08-11 DIAGNOSIS — J9622 Acute and chronic respiratory failure with hypercapnia: Secondary | ICD-10-CM | POA: Diagnosis not present

## 2015-08-11 DIAGNOSIS — G8929 Other chronic pain: Secondary | ICD-10-CM | POA: Diagnosis not present

## 2015-08-11 DIAGNOSIS — J101 Influenza due to other identified influenza virus with other respiratory manifestations: Principal | ICD-10-CM | POA: Diagnosis present

## 2015-08-11 DIAGNOSIS — F419 Anxiety disorder, unspecified: Secondary | ICD-10-CM | POA: Diagnosis present

## 2015-08-11 DIAGNOSIS — M1990 Unspecified osteoarthritis, unspecified site: Secondary | ICD-10-CM | POA: Diagnosis present

## 2015-08-11 DIAGNOSIS — Z888 Allergy status to other drugs, medicaments and biological substances status: Secondary | ICD-10-CM | POA: Diagnosis not present

## 2015-08-11 DIAGNOSIS — Z880 Allergy status to penicillin: Secondary | ICD-10-CM

## 2015-08-11 DIAGNOSIS — J129 Viral pneumonia, unspecified: Secondary | ICD-10-CM | POA: Diagnosis not present

## 2015-08-11 DIAGNOSIS — J9620 Acute and chronic respiratory failure, unspecified whether with hypoxia or hypercapnia: Secondary | ICD-10-CM | POA: Diagnosis not present

## 2015-08-11 DIAGNOSIS — Z9981 Dependence on supplemental oxygen: Secondary | ICD-10-CM | POA: Diagnosis not present

## 2015-08-11 DIAGNOSIS — Z833 Family history of diabetes mellitus: Secondary | ICD-10-CM | POA: Diagnosis not present

## 2015-08-11 DIAGNOSIS — J440 Chronic obstructive pulmonary disease with acute lower respiratory infection: Secondary | ICD-10-CM | POA: Diagnosis not present

## 2015-08-11 DIAGNOSIS — J209 Acute bronchitis, unspecified: Secondary | ICD-10-CM | POA: Diagnosis not present

## 2015-08-11 DIAGNOSIS — J9602 Acute respiratory failure with hypercapnia: Secondary | ICD-10-CM | POA: Diagnosis not present

## 2015-08-11 DIAGNOSIS — F411 Generalized anxiety disorder: Secondary | ICD-10-CM | POA: Diagnosis not present

## 2015-08-11 DIAGNOSIS — J441 Chronic obstructive pulmonary disease with (acute) exacerbation: Secondary | ICD-10-CM | POA: Diagnosis present

## 2015-08-11 DIAGNOSIS — Z885 Allergy status to narcotic agent status: Secondary | ICD-10-CM

## 2015-08-11 DIAGNOSIS — I10 Essential (primary) hypertension: Secondary | ICD-10-CM | POA: Diagnosis present

## 2015-08-11 DIAGNOSIS — J9600 Acute respiratory failure, unspecified whether with hypoxia or hypercapnia: Secondary | ICD-10-CM | POA: Diagnosis present

## 2015-08-11 DIAGNOSIS — R0602 Shortness of breath: Secondary | ICD-10-CM | POA: Diagnosis not present

## 2015-08-11 DIAGNOSIS — Z79899 Other long term (current) drug therapy: Secondary | ICD-10-CM

## 2015-08-11 DIAGNOSIS — R0603 Acute respiratory distress: Secondary | ICD-10-CM

## 2015-08-11 HISTORY — DX: Pulmonary hypertension, unspecified: I27.20

## 2015-08-11 LAB — BLOOD GAS, VENOUS
ACID-BASE EXCESS: 9 mmol/L — AB (ref 0.0–3.0)
Bicarbonate: 38.9 mEq/L — ABNORMAL HIGH (ref 21.0–28.0)
Delivery systems: POSITIVE
FIO2: 0.3
O2 SAT: 58.2 %
PATIENT TEMPERATURE: 37
PH VEN: 7.3 — AB (ref 7.320–7.430)
RATE: 8 resp/min
pCO2, Ven: 79 mmHg (ref 44.0–60.0)
pO2, Ven: 34 mmHg (ref 31.0–45.0)

## 2015-08-11 LAB — BASIC METABOLIC PANEL
ANION GAP: 5 (ref 5–15)
BUN: 14 mg/dL (ref 6–20)
CO2: 37 mmol/L — ABNORMAL HIGH (ref 22–32)
Calcium: 8.6 mg/dL — ABNORMAL LOW (ref 8.9–10.3)
Chloride: 96 mmol/L — ABNORMAL LOW (ref 101–111)
Creatinine, Ser: 0.68 mg/dL (ref 0.44–1.00)
Glucose, Bld: 120 mg/dL — ABNORMAL HIGH (ref 65–99)
Potassium: 2.7 mmol/L — CL (ref 3.5–5.1)
SODIUM: 138 mmol/L (ref 135–145)

## 2015-08-11 LAB — CBC
HCT: 39.8 % (ref 35.0–47.0)
Hemoglobin: 13.1 g/dL (ref 12.0–16.0)
MCH: 29.3 pg (ref 26.0–34.0)
MCHC: 33 g/dL (ref 32.0–36.0)
MCV: 88.7 fL (ref 80.0–100.0)
Platelets: 178 10*3/uL (ref 150–440)
RBC: 4.49 MIL/uL (ref 3.80–5.20)
RDW: 14.3 % (ref 11.5–14.5)
WBC: 7.7 10*3/uL (ref 3.6–11.0)

## 2015-08-11 LAB — MAGNESIUM: MAGNESIUM: 2.1 mg/dL (ref 1.7–2.4)

## 2015-08-11 LAB — TROPONIN I: TROPONIN I: 0.03 ng/mL (ref <0.031)

## 2015-08-11 LAB — BRAIN NATRIURETIC PEPTIDE: B NATRIURETIC PEPTIDE 5: 39 pg/mL (ref 0.0–100.0)

## 2015-08-11 LAB — LACTIC ACID, PLASMA
LACTIC ACID, VENOUS: 1.8 mmol/L (ref 0.5–2.0)
LACTIC ACID, VENOUS: 2 mmol/L (ref 0.5–2.0)

## 2015-08-11 LAB — RAPID INFLUENZA A&B ANTIGENS (ARMC ONLY)
INFLUENZA A (ARMC): POSITIVE — AB
INFLUENZA B (ARMC): NEGATIVE

## 2015-08-11 LAB — MRSA PCR SCREENING: MRSA BY PCR: NEGATIVE

## 2015-08-11 LAB — GLUCOSE, CAPILLARY: GLUCOSE-CAPILLARY: 147 mg/dL — AB (ref 65–99)

## 2015-08-11 MED ORDER — TRIAMTERENE-HCTZ 37.5-25 MG PO TABS
1.0000 | ORAL_TABLET | Freq: Every day | ORAL | Status: DC
  Administered 2015-08-11 – 2015-08-16 (×5): 1 via ORAL
  Filled 2015-08-11 (×5): qty 1

## 2015-08-11 MED ORDER — LEVOFLOXACIN IN D5W 500 MG/100ML IV SOLN
500.0000 mg | INTRAVENOUS | Status: DC
  Administered 2015-08-12: 500 mg via INTRAVENOUS
  Filled 2015-08-11 (×2): qty 100

## 2015-08-11 MED ORDER — ACETAMINOPHEN 325 MG PO TABS
650.0000 mg | ORAL_TABLET | Freq: Four times a day (QID) | ORAL | Status: DC | PRN
  Administered 2015-08-11 – 2015-08-15 (×6): 650 mg via ORAL
  Filled 2015-08-11 (×6): qty 2

## 2015-08-11 MED ORDER — MOMETASONE FURO-FORMOTEROL FUM 100-5 MCG/ACT IN AERO
2.0000 | INHALATION_SPRAY | Freq: Two times a day (BID) | RESPIRATORY_TRACT | Status: DC
  Administered 2015-08-11 – 2015-08-12 (×3): 2 via RESPIRATORY_TRACT
  Filled 2015-08-11: qty 8.8

## 2015-08-11 MED ORDER — INFLUENZA VAC SPLIT QUAD 0.5 ML IM SUSY: 0.5000 mL | PREFILLED_SYRINGE | INTRAMUSCULAR | Status: DC

## 2015-08-11 MED ORDER — METHYLPREDNISOLONE SODIUM SUCC 125 MG IJ SOLR
60.0000 mg | Freq: Four times a day (QID) | INTRAMUSCULAR | Status: DC
  Administered 2015-08-11 – 2015-08-12 (×4): 60 mg via INTRAVENOUS
  Filled 2015-08-11 (×4): qty 2

## 2015-08-11 MED ORDER — ALPRAZOLAM 0.25 MG PO TABS
0.2500 mg | ORAL_TABLET | Freq: Two times a day (BID) | ORAL | Status: DC | PRN
  Administered 2015-08-11 – 2015-08-13 (×6): 0.25 mg via ORAL
  Filled 2015-08-11 (×7): qty 1

## 2015-08-11 MED ORDER — IPRATROPIUM-ALBUTEROL 0.5-2.5 (3) MG/3ML IN SOLN
3.0000 mL | RESPIRATORY_TRACT | Status: DC
  Administered 2015-08-11 – 2015-08-16 (×22): 3 mL via RESPIRATORY_TRACT
  Filled 2015-08-11 (×23): qty 3

## 2015-08-11 MED ORDER — LEVOFLOXACIN IN D5W 500 MG/100ML IV SOLN
500.0000 mg | INTRAVENOUS | Status: DC
  Filled 2015-08-11: qty 100

## 2015-08-11 MED ORDER — SODIUM CHLORIDE 0.9% FLUSH
3.0000 mL | Freq: Two times a day (BID) | INTRAVENOUS | Status: DC
  Administered 2015-08-11 – 2015-08-16 (×9): 3 mL via INTRAVENOUS

## 2015-08-11 MED ORDER — ACETAMINOPHEN 650 MG RE SUPP: 650.0000 mg | Freq: Four times a day (QID) | RECTAL | Status: DC | PRN

## 2015-08-11 MED ORDER — BUDESONIDE 0.5 MG/2ML IN SUSP
0.5000 mg | Freq: Two times a day (BID) | RESPIRATORY_TRACT | Status: DC
Start: 2015-08-11 — End: 2015-08-16
  Administered 2015-08-11 – 2015-08-16 (×10): 0.5 mg via RESPIRATORY_TRACT
  Filled 2015-08-11 (×10): qty 2

## 2015-08-11 MED ORDER — TIOTROPIUM BROMIDE MONOHYDRATE 2.5 MCG/ACT IN AERS: 2.0000 | INHALATION_SPRAY | Freq: Every day | RESPIRATORY_TRACT | Status: DC

## 2015-08-11 MED ORDER — IPRATROPIUM-ALBUTEROL 0.5-2.5 (3) MG/3ML IN SOLN
3.0000 mL | Freq: Four times a day (QID) | RESPIRATORY_TRACT | Status: DC
  Administered 2015-08-11: 3 mL via RESPIRATORY_TRACT
  Filled 2015-08-11: qty 3

## 2015-08-11 MED ORDER — MONTELUKAST SODIUM 10 MG PO TABS
10.0000 mg | ORAL_TABLET | Freq: Every day | ORAL | Status: DC
  Administered 2015-08-11 – 2015-08-16 (×6): 10 mg via ORAL
  Filled 2015-08-11 (×6): qty 1

## 2015-08-11 MED ORDER — TIOTROPIUM BROMIDE MONOHYDRATE 18 MCG IN CAPS
18.0000 ug | ORAL_CAPSULE | Freq: Every day | RESPIRATORY_TRACT | Status: DC
  Administered 2015-08-11 – 2015-08-12 (×2): 18 ug via RESPIRATORY_TRACT
  Filled 2015-08-11: qty 5

## 2015-08-11 MED ORDER — SODIUM CHLORIDE 0.9 % IV SOLN: 250.0000 mL | INTRAVENOUS | Status: DC | PRN

## 2015-08-11 MED ORDER — POTASSIUM CHLORIDE CRYS ER 20 MEQ PO TBCR
40.0000 meq | EXTENDED_RELEASE_TABLET | Freq: Four times a day (QID) | ORAL | Status: DC
  Administered 2015-08-11 (×2): 40 meq via ORAL
  Filled 2015-08-11 (×2): qty 2

## 2015-08-11 MED ORDER — LORAZEPAM 2 MG/ML IJ SOLN
0.5000 mg | Freq: Once | INTRAMUSCULAR | Status: AC
  Administered 2015-08-11: 0.5 mg via INTRAVENOUS
  Filled 2015-08-11: qty 1

## 2015-08-11 MED ORDER — TRAMADOL HCL 50 MG PO TABS
50.0000 mg | ORAL_TABLET | Freq: Four times a day (QID) | ORAL | Status: DC | PRN
Start: 2015-08-11 — End: 2015-08-16

## 2015-08-11 MED ORDER — LEVOFLOXACIN IN D5W 750 MG/150ML IV SOLN
750.0000 mg | Freq: Once | INTRAVENOUS | Status: AC
  Administered 2015-08-11: 750 mg via INTRAVENOUS
  Filled 2015-08-11: qty 150

## 2015-08-11 MED ORDER — POTASSIUM CHLORIDE CRYS ER 20 MEQ PO TBCR: 20.0000 meq | EXTENDED_RELEASE_TABLET | Freq: Every day | ORAL | Status: DC

## 2015-08-11 MED ORDER — SODIUM CHLORIDE 0.9% FLUSH
3.0000 mL | INTRAVENOUS | Status: DC | PRN
  Administered 2015-08-11: 3 mL via INTRAVENOUS
  Filled 2015-08-11: qty 3

## 2015-08-11 MED ORDER — OSELTAMIVIR PHOSPHATE 75 MG PO CAPS
75.0000 mg | ORAL_CAPSULE | Freq: Once | ORAL | Status: AC
  Administered 2015-08-11: 75 mg via ORAL
  Filled 2015-08-11: qty 1

## 2015-08-11 MED ORDER — POTASSIUM CHLORIDE 10 MEQ/100ML IV SOLN
10.0000 meq | INTRAVENOUS | Status: DC
  Administered 2015-08-11: 10 meq via INTRAVENOUS
  Filled 2015-08-11 (×4): qty 100

## 2015-08-11 MED ORDER — OSELTAMIVIR PHOSPHATE 75 MG PO CAPS
75.0000 mg | ORAL_CAPSULE | Freq: Two times a day (BID) | ORAL | Status: AC
  Administered 2015-08-11 – 2015-08-16 (×10): 75 mg via ORAL
  Filled 2015-08-11 (×11): qty 1

## 2015-08-11 MED ORDER — IPRATROPIUM-ALBUTEROL 0.5-2.5 (3) MG/3ML IN SOLN
RESPIRATORY_TRACT | Status: AC
  Administered 2015-08-11: 09:00:00
  Filled 2015-08-11: qty 3

## 2015-08-11 MED ORDER — ENOXAPARIN SODIUM 40 MG/0.4ML ~~LOC~~ SOLN
40.0000 mg | SUBCUTANEOUS | Status: DC
Start: 2015-08-11 — End: 2015-08-16
  Administered 2015-08-11 – 2015-08-15 (×5): 40 mg via SUBCUTANEOUS
  Filled 2015-08-11 (×5): qty 0.4

## 2015-08-11 MED ORDER — HYDROMORPHONE HCL 1 MG/ML IJ SOLN
0.2500 mg | Freq: Once | INTRAMUSCULAR | Status: AC
  Administered 2015-08-11: 0.25 mg via INTRAVENOUS
  Filled 2015-08-11: qty 1

## 2015-08-11 MED ORDER — METHYLPREDNISOLONE SODIUM SUCC 125 MG IJ SOLR
125.0000 mg | Freq: Once | INTRAMUSCULAR | Status: AC
  Administered 2015-08-11: 125 mg via INTRAVENOUS
  Filled 2015-08-11: qty 2

## 2015-08-11 MED ORDER — GUAIFENESIN ER 600 MG PO TB12
600.0000 mg | ORAL_TABLET | Freq: Two times a day (BID) | ORAL | Status: DC
  Administered 2015-08-11 – 2015-08-12 (×4): 600 mg via ORAL
  Filled 2015-08-11 (×7): qty 1

## 2015-08-11 NOTE — ED Notes (Signed)
Arrives speaking one word sentences, states history of COPD, to room 25.

## 2015-08-11 NOTE — ED Notes (Signed)
Pt arrive from home with c/o of SOB that began Tues and have continued to increase.

## 2015-08-11 NOTE — Consult Note (Signed)
[facility] [place] [facility] Consultation      Date: 08/11/2015,   MRN# [id number] [name] 06-16-1945 Code Status:     Code Status Orders        Start     Ordered   08/11/15 1130  Full code   Continuous     08/11/15 1130    Code Status History    Date Active Date Inactive Code Status Order ID Comments User Context   This patient has a current code status but no historical code status.     Hosp day:@LENGTHOFSTAYDAYS @ Referring MD: @ATDPROV @     PCP:      AdmissionWeight: 168 lb 6.9 oz (76.4 kg)                 CurrentWeight: 168 lb 6.9 oz (76.4 kg) [name] is a 70 y.o. old female seen in consultation for COPD at the request of Dr.Patel.     CHIEF COMPLAINT:  Acuter esp distress    HISTORY OF PRESENT ILLNESS  70 y.o. female with a known history of chronic respiratory failure on 2 L of oxygen for COPD, history of pulmonary hypertension I's and osteoarthritis who states that she's been having progressive shortness of breath for the past 2 weeks.  -Patient also has had a productive cough and watery sputum with congestion for the past few days.  -She is also had sore throat and ear pain.  -Her husband recently had flulike illness. But did not have the flu test check.  -Patient also complains of chronic right upper quadrant abdominal pain related to her gallbladder. That was worse with coughing. She was seen in the ED with these complaints chest x-ray was negative  -she was noted to have flu test positive.  -Patient initially was having respiratory difficulty and had to be placed on BiPAP. Patient with increased WOB, with b/l wheezing   PAST MEDICAL HISTORY   Past Medical History  Diagnosis Date  . COPD (chronic obstructive pulmonary disease) (HCC)     2L 02 at baseline  . Hypertension   . Arthritis   . Pulmonary HTN (HCC)      SURGICAL HISTORY   Past Surgical History  Procedure Laterality Date  . None       FAMILY HISTORY   Family  History  Problem Relation Age of Onset  . Heart attack Mother   . Cancer Father   . Thyroid disease Father   . Diabetes Sister   . Diabetes Brother      SOCIAL HISTORY   Social History  Substance Use Topics  . Smoking status: Former Smoker    Quit date: 05/20/2007  . Smokeless tobacco: Never Used  . Alcohol Use: No     MEDICATIONS    Home Medication:  No current outpatient prescriptions on file.  Current Medication:  Current facility-administered medications:  .  0.9 %  sodium chloride infusion, 250 mL, Intravenous, PRN, Auburn BilberryShreyang Patel, MD .  acetaminophen (TYLENOL) tablet 650 mg, 650 mg, Oral, Q6H PRN **OR** acetaminophen (TYLENOL) suppository 650 mg, 650 mg, Rectal, Q6H PRN, Auburn BilberryShreyang Patel, MD .  enoxaparin (LOVENOX) injection 40 mg, 40 mg, Subcutaneous, Q24H, Auburn BilberryShreyang Patel, MD .  guaiFENesin (MUCINEX) 12 hr tablet 600 mg, 600 mg, Oral, BID, Auburn BilberryShreyang Patel, MD .  ipratropium-albuterol (DUONEB) 0.5-2.5 (3) MG/3ML nebulizer solution 3 mL, 3 mL, Nebulization, Q6H, Auburn BilberryShreyang Patel, MD, 3 mL at 08/11/15 1405 .  ipratropium-albuterol (DUONEB) 0.5-2.5 (3) MG/3ML nebulizer solution, , , ,  .  [  START ON 08/12/2015] levofloxacin (LEVAQUIN) IVPB 500 mg, 500 mg, Intravenous, Q24H, [name], MD .  methylPREDNISolone sodium succinate (SOLU-MEDROL) 125 mg/2 mL injection 60 mg, 60 mg, Intravenous, Q6H, [name], MD .  oseltamivir (TAMIFLU) capsule 75 mg, 75 mg, Oral, BID, [name], MD .  potassium chloride 10 mEq in 100 mL IVPB, 10 mEq, Intravenous, Q1 Hr x 4, [name], MD, Stopped at 08/11/15 1316 .  sodium chloride flush (NS) 0.9 % injection 3 mL, 3 mL, Intravenous, Q12H, [name], MD .  sodium chloride flush (NS) 0.9 % injection 3 mL, 3 mL, Intravenous, PRN, [name], MD    ALLERGIES   Codeine; Demerol; Morphine and related; Oxycodone; Alendronate; Amoxicillin-pot clavulanate; and Risedronate     REVIEW OF SYSTEMS   Review of Systems    Constitutional: Positive for malaise/fatigue. Negative for fever, chills and weight loss.  HENT: Negative for congestion and hearing loss.   Eyes: Negative for blurred vision and double vision.  Respiratory: Positive for cough, shortness of breath and wheezing.   Cardiovascular: Negative for chest pain, palpitations and orthopnea.  Gastrointestinal: Negative for heartburn, nausea, vomiting, abdominal pain, diarrhea, constipation and blood in stool.  Genitourinary: Negative for dysuria and urgency.  Musculoskeletal: Negative for myalgias.  Skin: Negative for rash.  Neurological: Negative for dizziness and headaches.  Endo/Heme/Allergies: Does not bruise/bleed easily.  Psychiatric/Behavioral: The patient is nervous/anxious.   All other systems reviewed and are negative.    VS: BP 147/62 mmHg  Pulse 105  Temp(Src) 98.2 F (36.8 C) (Oral)  Resp 23  Ht  (1.575 m)  Wt 168 lb 6.9 oz (76.4 kg)  BMI 30.80 kg/m2  SpO2 100%     PHYSICAL EXAM  Physical Exam  Constitutional: She is oriented to person, place, and time. She appears well-developed and well-nourished. She appears distressed.  HENT:  Head: Normocephalic and atraumatic.  Mouth/Throat: No oropharyngeal exudate.  Eyes: EOM are normal. Pupils are equal, round, and reactive to light. No scleral icterus.  Neck: Normal range of motion. Neck supple.  Cardiovascular: Normal rate, regular rhythm and normal heart sounds.   No murmur heard. Pulmonary/Chest: No stridor. She is in respiratory distress. She has wheezes.  Abdominal: Soft. Bowel sounds are normal. She exhibits no distension. There is no tenderness. There is no rebound.  Musculoskeletal: Normal range of motion. She exhibits no edema.  Neurological: She is alert and oriented to person, place, and time. She displays normal reflexes. Coordination normal.  Skin: Skin is warm. She is diaphoretic.  Psychiatric: She has a normal mood and affect.        LABS    Recent  Labs     08/11/15  0926  HGB  13.1  HCT  39.8  MCV  88.7  WBC  7.7  BUN  14  CREATININE  0.68  GLUCOSE  120*  CALCIUM  8.6*  ,      CULTURE RESULTS   Recent Results (from the past 240 hour(s))  Rapid Influenza A&B Antigens ([facility] only)     Status: Abnormal   Collection Time: 08/11/15  9:26 AM  Result Value Ref Range Status   Influenza A [facility]) POSITIVE (A) NEGATIVE Final   Influenza B ([facility]) NEGATIVE NEGATIVE Final          IMAGING    Dg Chest Portable 1 View  08/11/2015  CLINICAL DATA:  increasing SOB all week, known COPD EXAM: PORTABLE CHEST 1 VIEW COMPARISON:  CT 11/18/2014, chest x-ray 11/18/2014  FINDINGS: Normal mediastinum and cardiac silhouette. Lungs are hyperinflated. Normal pulmonary vasculature. No evidence of effusion, infiltrate, or pneumothorax. No acute bony abnormality. IMPRESSION: Hyperinflated lungs.  No acute findings. Electronically Signed   By: Genevive Bi M.D.   On: 08/11/2015 09:53        ASSESSMENT/PLAN   70 yo white female admitted for acute COPD exacerbation from acute INLFUENZA A high risk for intubation  1.oxygen and biPAP as needed 2.will start aggressive BD therapy with inhaled  Therapy 3.cont iv steroids and abx     I have personally obtained a history, examined the patient, evaluated laboratory and independently reviewed imaging results, formulated the assessment and plan and placed orders.  The Patient requires high complexity decision making for assessment and support, frequent evaluation and titration of therapies, application of advanced monitoring technologies and extensive interpretation of multiple databases.    Patient/Family are satisfied with Plan of action and management. All questions answered  Lucie Leather, M.D.  [facility]  Medical Director [facility] Medical Director [facility] Cardio-Pulmonary Department

## 2015-08-11 NOTE — H&P (Signed)
[facility] [place] [facility]   PATIENT NAME: Sarah Mckee    MR#:  [id number]  DATE OF BIRTH:  [birth year]  DATE OF ADMISSION:  08/11/2015  PRIMARY CARE PHYSICIAN: Patrice Paradise, MD   REQUESTING/REFERRING PHYSICIAN: Dr. Sharma Covert  CHIEF COMPLAINT:   Chief Complaint  Patient presents with  . Shortness of Breath    HISTORY OF PRESENT ILLNESS: Sarah Mckee  is a 70 y.o. female with a known history of chronic respiratory failure on 2 L of oxygen for COPD, history of pulmonary hypertension I's and osteoarthritis who states that she's been having progressive shortness of breath for the past 2 weeks. Patient also has had a productive cough and watery sputum with congestion for the past few days. She is also had sore throat and ear pain. Her husband recently had flulike illness. But did not have the flu test check. Patient also complains of chronic right upper quadrant abdominal pain related to her gallbladder. That was worse with coughing. She was seen in the ED with these complaints chest x-ray was negative she was noted to have flu test positive. Patient initially was having respiratory difficulty and had to be placed on BiPAP. Currently she is comfortable on BiPAP. She denies any fevers or chills at home. PAST MEDICAL HISTORY:   Past Medical History  Diagnosis Date  . COPD (chronic obstructive pulmonary disease) (HCC)     2L 02 at baseline  . Hypertension   . Arthritis   . Pulmonary HTN (HCC)     PAST SURGICAL HISTORY: Past Surgical History  Procedure Laterality Date  . None      SOCIAL HISTORY:  Social History  Substance Use Topics  . Smoking status: Former Smoker    Quit date: 05/20/2007  . Smokeless tobacco: Never Used  . Alcohol Use: No    FAMILY HISTORY:  Family History  Problem Relation Age of Onset  . Heart attack Mother   . Cancer Father   . Thyroid disease Father   . Diabetes Sister   . Diabetes Brother     DRUG ALLERGIES:   Allergies  Allergen Reactions  . Codeine Shortness Of Breath  . Demerol [Meperidine] Shortness Of Breath  . Morphine And Related Shortness Of Breath  . Oxycodone Shortness Of Breath  . Alendronate     Other reaction(s): Other (See Comments) esophagitis  . Amoxicillin-Pot Clavulanate     Other reaction(s): Unknown  . Risedronate     Other reaction(s): Other (See Comments) epigastric pain    REVIEW OF SYSTEMS:   CONSTITUTIONAL: No fever, positive fatigue and weakness.  EYES: No blurred or double vision.  EARS, NOSE, AND THROAT: No tinnitus or ear pain.  RESPIRATORY: Positive cough, and shortness of breath, occasional wheezing, no hemoptysis.  CARDIOVASCULAR: No chest pain, orthopnea, edema.  GASTROINTESTINAL: No nausea, vomiting, diarrhea or right upper quadrant abdominal pain which is intermittently chronic related to gallbladder disease GENITOURINARY: No dysuria, hematuria.  ENDOCRINE: No polyuria, nocturia,  HEMATOLOGY: No anemia, easy bruising or bleeding SKIN: No rash or lesion. MUSCULOSKELETAL: No joint pain or arthritis.   NEUROLOGIC: No tingling, numbness, weakness.  PSYCHIATRY: No anxiety or depression.   MEDICATIONS AT HOME:  Prior to Admission medications   Medication Sig Start Date End Date Taking? Authorizing Provider  acetaminophen (TYLENOL) 500 MG tablet Take 1,000 mg by mouth at bedtime.   Yes Historical Provider, MD  albuterol (PROVENTIL) (2.5 MG/3ML) 0.083% nebulizer solution Take 3 mLs by nebulization every 6 (  six) hours as needed. For wheezing. 07/31/15  Yes Historical Provider, MD  ALPRAZolam Prudy Feeler) 0.25 MG tablet Take 0.25 mg by mouth 2 (two) times daily as needed. For anxiety. 07/18/15  Yes Historical Provider, MD  azithromycin (ZITHROMAX) 250 MG tablet Take 250-500 mg by mouth See admin instructions. Take 2 tablets ( ) by mouth for day 1, then take 1 tablet by mouth every day x 4 days, then stop. 08/09/15  Yes Historical Provider, MD  montelukast  (SINGULAIR) 10 MG tablet Take 10 mg by mouth daily. 08/08/15  Yes Historical Provider, MD  potassium chloride SA (KLOR-CON M20) 20 MEQ tablet Take 20 mEq by mouth daily.  02/20/15  Yes Historical Provider, MD  PROAIR HFA 108 (90 Base) MCG/ACT inhaler Inhale 2 puffs into the lungs See admin instructions. Inhale 2 puffs every 4 to 6 hours as needed for shortness of breath/wheezing. 06/13/15  Yes Historical Provider, MD  SPIRIVA RESPIMAT 2.5 MCG/ACT AERS Inhale 2 puffs into the lungs daily. 07/12/15  Yes Historical Provider, MD  traMADol (ULTRAM) 50 MG tablet Take 50-100 mg by mouth every 6 (six) hours as needed. For pain. 07/12/15  Yes Historical Provider, MD  triamterene-hydrochlorothiazide (MAXZIDE-25) 37.5-25 MG tablet Take 1 tablet by mouth daily. 08/06/15  Yes Historical Provider, MD  HYDROmorphone (DILAUDID) 2 MG tablet Take 1 tablet (2 mg total) by mouth every 12 (twelve) hours as needed for severe pain. 12/02/14 12/02/15  Emily Filbert, MD      PHYSICAL EXAMINATION:   VITAL SIGNS: Blood pressure 171/98, pulse 114, temperature 98.1 F (36.7 C), temperature source Oral, resp. rate 18, SpO2 97 %.  GENERAL:  70 y.o.-year-old patient lying in the bed currently on BiPAP EYES: Pupils equal, round, reactive to light and accommodation. No scleral icterus. Extraocular muscles intact.  HEENT: Head atraumatic, normocephalic. Oropharynx and nasopharynx clear.  NECK:  Supple, no jugular venous distention. No thyroid enlargement, no tenderness.  LUNGS: Diminished breath sounds bilaterally without any active wheezing rhonchi or rales or crackles. There is some associated muscle usage. CARDIOVASCULAR: S1, S2 normal. No murmurs, rubs, or gallops.  ABDOMEN: Soft, nontender, nondistended. Bowel sounds present. No organomegaly or mass.  EXTREMITIES: Nonpitting pedal edema, cyanosis, or clubbing.  NEUROLOGIC: Cranial nerves II through XII are intact. Muscle strength 5/5 in all extremities. Sensation intact.  Gait not checked.  PSYCHIATRIC: The patient is alert and oriented x 3.  SKIN: No obvious rash, lesion, or ulcer.   LABORATORY PANEL:   CBC  Recent Labs Lab 08/11/15 0926  WBC 7.7  HGB 13.1  HCT 39.8  PLT 178  MCV 88.7  MCH 29.3  MCHC 33.0  RDW 14.3   ------------------------------------------------------------------------------------------------------------------  Chemistries   Recent Labs Lab 08/11/15 0926  NA 138  K 2.7*  CL 96*  CO2 37*  GLUCOSE 120*  BUN 14  CREATININE 0.68  CALCIUM 8.6*   ------------------------------------------------------------------------------------------------------------------ CrCl cannot be calculated (Unknown ideal weight.). ------------------------------------------------------------------------------------------------------------------ No results for input(s): TSH, T4TOTAL, T3FREE, THYROIDAB in the last 72 hours.  Invalid input(s): FREET3   Coagulation profile No results for input(s): INR, PROTIME in the last 168 hours. ------------------------------------------------------------------------------------------------------------------- No results for input(s): DDIMER in the last 72 hours. -------------------------------------------------------------------------------------------------------------------  Cardiac Enzymes  Recent Labs Lab 08/11/15 0926  TROPONINI 0.03   ------------------------------------------------------------------------------------------------------------------ Invalid input(s): POCBNP  ---------------------------------------------------------------------------------------------------------------  Urinalysis No results found for: COLORURINE, APPEARANCEUR, LABSPEC, PHURINE, GLUCOSEU, HGBUR, BILIRUBINUR, KETONESUR, PROTEINUR, UROBILINOGEN, NITRITE, LEUKOCYTESUR   RADIOLOGY: Dg Chest Portable 1 View  08/11/2015  CLINICAL DATA:  increasing SOB  all week, known COPD EXAM: PORTABLE CHEST 1 VIEW COMPARISON:   CT 11/18/2014, chest x-ray 11/18/2014 FINDINGS: Normal mediastinum and cardiac silhouette. Lungs are hyperinflated. Normal pulmonary vasculature. No evidence of effusion, infiltrate, or pneumothorax. No acute bony abnormality. IMPRESSION: Hyperinflated lungs.  No acute findings. Electronically Signed   By: Genevive BiStewart  Edmunds M.D.   On: 08/11/2015 09:53    EKG: Orders placed or performed during the hospital encounter of 08/11/15  . EKG 12-Lead  . EKG 12-Lead  . ED EKG  . ED EKG    IMPRESSION AND PLAN: Patient is a 70 year old white female with chronic respiratory failure presents with worsening shortness of breath  1. Acute on chronic respiratory failure due to acute on chronic COPD exasperation as well as acute bronchitis: We will treat patient with nebulizers  Every 6 Hours Also Pl. her on IV Solu-Medrol every every 6 hours. We will add Mucinex to her therapy. Continue Spiriva. I will ask the pulmonologist to see the patient..    2. Influenza : Start patient on Tamiflu twice a day   3. Chronic right upper quadrant abdominal pain of symptoms worsen consider repeating ultrasound abdomen    4. Severe hypokalemia we'll give her IV potassium and check magnesium   5. Hypertension continue Try mentoring HCTZ  6. Lower extremity swelling history of pulmonary hypertension I'll check echo her last echo was few years ago.  7. Miscellaneous we will do Lovenox for DVT prophylaxis   ords are reviewed and case discussed with ED provider. Management plans discussed with the patient, family and they are in agreement.  CODE STATUS: full     Code Status Orders        Start     Ordered   08/11/15 1130  Full code   Continuous     08/11/15 1130    Code Status History    Date Active Date Inactive Code Status Order ID Comments User Context   This patient has a current code status but no historical code status.       TOTAL TIME TAKING CARE OF THIS PATIENT:  55 minutes critical care time    Auburn BilberryPATEL, Deonte Otting M.D on 08/11/2015 at 11:36 AM  Between 7am to 6pm - Pager - [phone]  After 6pm go to [email or web address] - password [email or web address] [facility]  [facility]  Office  [phone]  CC: Primary care physician; Patrice ParadiseMCLAUGHLIN, MIRIAM K, MD

## 2015-08-11 NOTE — Progress Notes (Signed)
   08/11/15 1700  Clinical Encounter Type  Visited With Patient  Visit Type Initial  Referral From Patient  Consult/Referral To Chaplain  Chaplain visited with patient but patient was not able to tolerate visit very long due to difficulty breathing.   Fisher ScientificChaplain Kendra Grissett [phone]

## 2015-08-11 NOTE — ED Notes (Signed)
Attempted 2nd IV access. Unable to obtain prior to transfer to ICU.

## 2015-08-11 NOTE — ED Provider Notes (Signed)
[facility] Emergency Department Provider Note  ____________________________________________  Time seen: Approximately 9:12 AM  I have reviewed the triage vital signs and the nursing notes.   HISTORY  Chief Complaint Shortness of Breath    HPI Sarah Mckee is a 70 y.o. female with end-stage COPD on 2 L nasal cannula baseline, HTN, and chronic bilateral lower extremity lymphedema presenting with respiratory distress. The patient and her husband described that the patient has had a progressively worsening exertional and now at rest shortness of breath for the past 2 weeks. Last week, her husband developed a flulike illness. For the past few days, the patient has had an increased cough productive of watery sputum with mild congestion but no rhinorrhea, sore throat or ear pain. No fevers or chills. Her PMD started her on a Z-Pak 3 days ago, but her symptoms have continued to worsen. She denies any nausea, vomiting or diarrhea, abdominal pain. She has a diffuse chest pain only when she coughs and denies palpitations. Her lower extremity swelling which is chronic is slightly worse.   Past Medical History  Diagnosis Date  . COPD (chronic obstructive pulmonary disease) (HCC)     2L 02 at baseline  . Hypertension   . Arthritis     There are no active problems to display for this patient.   History reviewed. No pertinent past surgical history.  Current Outpatient Rx  Name  Route  Sig  Dispense  Refill  . HYDROmorphone (DILAUDID) 2 MG tablet   Oral   Take 1 tablet (2 mg total) by mouth every 12 (twelve) hours as needed for severe pain.   20 tablet   0     Allergies Codeine; Demerol; Dilaudid; Morphine and related; and Oxycodone  Family History  Problem Relation Age of Onset  . Heart attack Mother   . Cancer Father   . Thyroid disease Father   . Diabetes Sister   . Diabetes Brother     Social History Social History  Substance Use Topics  . Smoking  status: Former Smoker    Quit date: 05/20/2007  . Smokeless tobacco: Never Used  . Alcohol Use: No    Review of Systems  Constitutional: No fever/chills. No lightheadedness or syncope. Positive general malaise.  Eyes: No visual changes. ENT: No sore throat. Cardiovascular: Positive chest pain, without palpitations. Respiratory: Positive respiratory distress and shortness of breath.  Positive productive cough. Gastrointestinal: No abdominal pain.  No nausea, no vomiting.  No diarrhea.  No constipation. Genitourinary: Negative for dysuria. Musculoskeletal: Negative for back pain. Skin: Negative for rash. Neurological: Negative for headaches, focal weakness or numbness.  10-point ROS otherwise negative.  ____________________________________________   PHYSICAL EXAM:  VITAL SIGNS: ED Triage Vitals  Enc Vitals Group     BP --      Pulse Rate 08/11/15 0851 121     Resp 08/11/15 0851 24     Temp 08/11/15 0851 98.1 F (36.7 C)     Temp Source 08/11/15 0851 Oral     SpO2 08/11/15 0851 87 %     Weight --      Height --      Head Cir --      Peak Flow --      Pain Score --      Pain Loc --      Pain Edu? --      Excl. in GC? --     Constitutional: Patient is alert and oriented and answering questions  appropriately. She is chronically ill-appearing with acute respiratory distress. At this time, she is mentating well.  Eyes: Conjunctivae are normal.  EOMI. no eye discharge.  Head: Atraumatic. Nose: No congestion/rhinnorhea. Mouth/Throat: Mucous membranes are dry.  Neck: No stridor.  Supple.  Positive JVD. Cardiovascular: Fast rate in the 120s on my exam., regular rhythm. No murmurs, rubs or gallops.  Respiratory: Positive respiratory distress, able to speak in 1-2 word sentences with some difficulty. Positive accessory muscle use and retractions with associated tachypnea. Positive diffuse end expiratory wheezing with poor air exchange. No rales or rhonchi. Gastrointestinal:  Soft and nontender. No distention. No peritoneal signs. Musculoskeletal: Positive symmetric bilateral lower extremity edema without palpable cords, tenderness to palpation in the calves, or [place] sign. Neurologic:  Normal speech and language. No gross focal neurologic deficits are appreciated.  Skin:  Skin is warm, dry and intact. No rash noted. Psychiatric: Mood and affect are normal. Speech and behavior are normal.  Normal judgement.  ____________________________________________   LABS (all labs ordered are listed, but only abnormal results are displayed)  Labs Reviewed  RAPID INFLUENZA A&B ANTIGENS ([facility] ONLY) - Abnormal; Notable for the following:    Influenza A ([facility]) POSITIVE (*)    All other components within normal limits  BLOOD GAS, VENOUS - Abnormal; Notable for the following:    pH, Ven 7.30 (*)    pCO2, Ven 79 (*)    Bicarbonate 38.9 (*)    Acid-Base Excess 9.0 (*)    All other components within normal limits  CULTURE, BLOOD (ROUTINE X 2)  CULTURE, BLOOD (ROUTINE X 2)  LACTIC ACID, PLASMA  CBC  LACTIC ACID, PLASMA  BASIC METABOLIC PANEL  BRAIN NATRIURETIC PEPTIDE  TROPONIN I   ____________________________________________  EKG  ED ECG REPORT I, Rockne MenghiniNorman, Anne-Caroline, the attending physician, personally viewed and interpreted this ECG.   Date: 08/11/2015  EKG Time: 901   Rate: 120  Rhythm: sinus tachycardia  Axis: normal  Intervals:none  ST&T Change: No ST elevation.  ____________________________________________  RADIOLOGY  Dg Chest Portable 1 View  08/11/2015  CLINICAL DATA:  increasing SOB all week, known COPD EXAM: PORTABLE CHEST 1 VIEW COMPARISON:  CT 11/18/2014, chest x-ray 11/18/2014 FINDINGS: Normal mediastinum and cardiac silhouette. Lungs are hyperinflated. Normal pulmonary vasculature. No evidence of effusion, infiltrate, or pneumothorax. No acute bony abnormality. IMPRESSION: Hyperinflated lungs.  No acute findings. Electronically Signed   By:  Genevive BiStewart  Edmunds M.D.   On: 08/11/2015 09:53    ____________________________________________   PROCEDURES  Procedure(s) performed: None  Critical Care performed: Yes ____________________________________________   INITIAL IMPRESSION / ASSESSMENT AND PLAN / ED COURSE  Pertinent labs & imaging results that were available during my care of the patient were reviewed by me and considered in my medical decision making (see chart for details).  70 y.o. female with end-stage COPD on 2 L nasal cannula baseline presenting with 2 weeks    progressively worsening shortness of breath, acute onset of severe worsening productive cough over the past several days not responding to outpatient antibiotics. On arrival to the emergency department the patient is tachycardic to the 120s, tachypnea in the mid 20s with an oxygen saturation of 87% on her home oxygen. She is not moving good air and is having significant difficulty breathing with significant exertion.  The patient will be treated for acute COPD flare as well as with empiric antibiotics immediately. She has been given a DuoNeb and I will place her on BiPAP. I will also evaluate her  for cardiac cause of her symptoms including congestive heart failure or acute MI or at ACS but a primary pulmonary causes the most likely etiology.  ----------------------------------------- 10:41 AM on 08/11/2015 -----------------------------------------  The patient's blood gas does show that she is acidemic with hypercarbia. She has been placed on BiPAP and her heart rate is now at 103 with a continued normal blood pressure. Her oxygenation is high 90s to 100%. Her influenza test is positive, and I have ordered Tamiflu. She is breathing more slowly but feeling claustrophobic and having a panicky sensation about the BiPAP. I will give her a small dose of Ativan. In addition she has chronic right upper quadrant pain from gallstones as she is not an operative candidate for  cholecystectomy. I will give her a small dose of Dilaudid as she is allergic to all other opiates. We will continue to monitor her and keep her on the BiPAP for now. She will require admission.  CRITICAL CARE Performed by: Rockne Menghini   Total critical care time: 50 minutes  Critical care time was exclusive of separately billable procedures and treating other patients.  Critical care was necessary to treat or prevent imminent or life-threatening deterioration.  Critical care was time spent personally by me on the following activities: development of treatment plan with patient and/or surrogate as well as nursing, discussions with consultants, evaluation of patient's response to treatment, examination of patient, obtaining history from patient or surrogate, ordering and performing treatments and interventions, ordering and review of laboratory studies, ordering and review of radiographic studies, pulse oximetry and re-evaluation of patient's condition.  ____________________________________________  FINAL CLINICAL IMPRESSION(S) / ED DIAGNOSES  Final diagnoses:  Respiratory distress  Hypercarbia  Influenza      NEW MEDICATIONS STARTED DURING THIS VISIT:  New Prescriptions   No medications on file     Rockne Menghini, MD 08/11/15 1043

## 2015-08-12 ENCOUNTER — Inpatient Hospital Stay
Admit: 2015-08-12 | Discharge: 2015-08-12 | Disposition: A | Discharge status: Home / Self Care | Payer: PPO | Attending: Internal Medicine | Admitting: Internal Medicine

## 2015-08-12 DIAGNOSIS — R0600 Dyspnea, unspecified: Secondary | ICD-10-CM | POA: Diagnosis not present

## 2015-08-12 DIAGNOSIS — J441 Chronic obstructive pulmonary disease with (acute) exacerbation: Secondary | ICD-10-CM | POA: Diagnosis not present

## 2015-08-12 DIAGNOSIS — J9601 Acute respiratory failure with hypoxia: Secondary | ICD-10-CM | POA: Diagnosis not present

## 2015-08-12 DIAGNOSIS — J9600 Acute respiratory failure, unspecified whether with hypoxia or hypercapnia: Secondary | ICD-10-CM | POA: Diagnosis not present

## 2015-08-12 DIAGNOSIS — J111 Influenza due to unidentified influenza virus with other respiratory manifestations: Secondary | ICD-10-CM | POA: Diagnosis not present

## 2015-08-12 LAB — CBC
HCT: 39.1 % (ref 35.0–47.0)
Hemoglobin: 13.2 g/dL (ref 12.0–16.0)
MCH: 29.5 pg (ref 26.0–34.0)
MCHC: 33.6 g/dL (ref 32.0–36.0)
MCV: 87.7 fL (ref 80.0–100.0)
PLATELETS: 176 10*3/uL (ref 150–440)
RBC: 4.46 MIL/uL (ref 3.80–5.20)
RDW: 14.1 % (ref 11.5–14.5)
WBC: 1.9 10*3/uL — ABNORMAL LOW (ref 3.6–11.0)

## 2015-08-12 LAB — BASIC METABOLIC PANEL
Anion gap: 8 (ref 5–15)
BUN: 18 mg/dL (ref 6–20)
CO2: 34 mmol/L — ABNORMAL HIGH (ref 22–32)
Calcium: 9.1 mg/dL (ref 8.9–10.3)
Chloride: 98 mmol/L — ABNORMAL LOW (ref 101–111)
Creatinine, Ser: 0.51 mg/dL (ref 0.44–1.00)
GFR calc Af Amer: >60 mL/min (ref >60)
GLUCOSE: 137 mg/dL — AB (ref 65–99)
POTASSIUM: 4.1 mmol/L (ref 3.5–5.1)
Sodium: 140 mmol/L (ref 135–145)

## 2015-08-12 LAB — GLUCOSE, CAPILLARY: Glucose-Capillary: 165 mg/dL — ABNORMAL HIGH (ref 65–99)

## 2015-08-12 MED ORDER — METHYLPREDNISOLONE SODIUM SUCC 40 MG IJ SOLR
40.0000 mg | Freq: Two times a day (BID) | INTRAMUSCULAR | Status: DC
  Administered 2015-08-12 – 2015-08-15 (×6): 40 mg via INTRAVENOUS
  Filled 2015-08-12 (×6): qty 1

## 2015-08-12 MED ORDER — SODIUM CHLORIDE 0.9 % IV SOLN: INTRAVENOUS | Status: DC

## 2015-08-12 NOTE — Progress Notes (Signed)
Pt refuses to have ivf initiated . States it will require her to be up all noc urinating. . md acknowledged okay to discontinue fluids

## 2015-08-12 NOTE — Consult Note (Signed)
[facility]monary [facility] Consultation      Date: 08/12/2015,   MRN# [id number] Sarah Mckee 04-15-1946 Code Status:     Code Status Orders        Start     Ordered   08/11/15 1130  Full code   Continuous     08/11/15 1130    Code Status History    Date Active Date Inactive Code Status Order ID Comments User Context   This patient has a current code status but no historical code status.     Hosp day:@LENGTHOFSTAYDAYS @ Referring MD: @     PCP:      AdmissionWeight: 168 lb 6.9 oz (76.4 kg)                 CurrentWeight: 168 lb 6.9 oz (76.4 kg) Sarah Mckee is a 70 y.o. old female seen in consultation for COPD at the request of Dr.Patel.     CHIEF COMPLAINT:  Acuter esp distress    HISTORY OF PRESENT ILLNESS  SOB and wheezing much improved, sitting in chair, WOB with talking still present On/off bipap last night   ALLERGIES   Codeine; Demerol; Morphine and related; Oxycodone; Alendronate; Amoxicillin-pot clavulanate; and Risedronate     REVIEW OF SYSTEMS   Review of Systems  Constitutional: Positive for malaise/fatigue. Negative for fever, chills and weight loss.  HENT: Negative for congestion and hearing loss.   Eyes: Negative for blurred vision and double vision.  Respiratory: Positive for cough, shortness of breath and wheezing.   Cardiovascular: Negative for chest pain, palpitations and orthopnea.  Gastrointestinal: Negative for heartburn, nausea, vomiting, abdominal pain, diarrhea, constipation and blood in stool.  Genitourinary: Negative for dysuria and urgency.  Musculoskeletal: Negative for myalgias.  Skin: Negative for rash.  Neurological: Negative for dizziness and headaches.  Endo/Heme/Allergies: Does not bruise/bleed easily.  Psychiatric/Behavioral: The patient is nervous/anxious.   All other systems reviewed and are negative.    VS: BP 124/71 mmHg  Pulse 107  Temp(Src) 97.9 F (36.6 C) (Oral)  Resp 27  Ht   (1.575 m)  Wt 168 lb 6.9 oz (76.4 kg)  BMI 30.80 kg/m2  SpO2 99%     PHYSICAL EXAM  Physical Exam  Constitutional: She is oriented to person, place, and time. She appears well-developed and well-nourished. No distress.  HENT:  Head: Normocephalic and atraumatic.  Mouth/Throat: No oropharyngeal exudate.  Eyes: EOM are normal. Pupils are equal, round, and reactive to light. No scleral icterus.  Neck: Normal range of motion. Neck supple.  Cardiovascular: Normal rate, regular rhythm and normal heart sounds.   No murmur heard. Pulmonary/Chest: No stridor. No respiratory distress. She has wheezes.  Abdominal: Soft. Bowel sounds are normal. She exhibits no distension. There is no tenderness. There is no rebound.  Musculoskeletal: Normal range of motion. She exhibits no edema.  Neurological: She is alert and oriented to person, place, and time. She displays normal reflexes. Coordination normal.  Skin: Skin is warm. She is not diaphoretic.  Psychiatric: She has a normal mood and affect.        LABS    Recent Labs     08/11/15  0926  08/12/15  0644  HGB  13.1  13.2  HCT  39.8  39.1  MCV  88.7  87.7  WBC  7.7  1.9*  BUN  14  18  CREATININE  0.68  0.51  GLUCOSE  120*  137*  CALCIUM  8.6*  9.1  ,  CULTURE RESULTS   Recent Results (from the past 240 hour(s))  Rapid Influenza A&B Antigens ([facility] only)     Status: Abnormal   Collection Time: 08/11/15  9:26 AM  Result Value Ref Range Status   Influenza A ([facility]) POSITIVE (A) NEGATIVE Final   Influenza B ([facility]) NEGATIVE NEGATIVE Final  MRSA PCR Screening     Status: None   Collection Time: 08/11/15  1:50 PM  Result Value Ref Range Status   MRSA by PCR NEGATIVE NEGATIVE Final    Comment:        The GeneXpert MRSA Assay (FDA approved for NASAL specimens only), is one component of a comprehensive MRSA colonization surveillance program. It is not intended to diagnose MRSA infection nor to guide or monitor treatment  for MRSA infections.           IMAGING    Dg Chest Portable 1 View  08/11/2015  CLINICAL DATA:  increasing SOB all week, known COPD EXAM: PORTABLE CHEST 1 VIEW COMPARISON:  CT 11/18/2014, chest x-ray 11/18/2014 FINDINGS: Normal mediastinum and cardiac silhouette. Lungs are hyperinflated. Normal pulmonary vasculature. No evidence of effusion, infiltrate, or pneumothorax. No acute bony abnormality. IMPRESSION: Hyperinflated lungs.  No acute findings. Electronically Signed   By: Genevive BiStewart  Edmunds M.D.   On: 08/11/2015 09:53        ASSESSMENT/PLAN   70 yo white female admitted for acute COPD exacerbation from acute INLFUENZA viral bronchitis  1.oxygen and biPAP at night 2.will continue  aggressive BD therapy with inhaled  Therapy 3.cont iv steroids and may consider stopping levaquin.  Ok to transfer to gen med floor today  The Patient requires high complexity decision making for assessment and support, frequent evaluation and titration of therapies, application of advanced monitoring technologies and extensive interpretation of multiple databases.    Patient  satisfied with Plan of action and management. All questions answered  Sarah Mckee, M.D.  [facility]  Medical Director [facility] Medical Director [facility]
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
## 2015-08-12 NOTE — Progress Notes (Signed)
Pt. Tachycardic and labored breathing, dsypnea at rest and with exertion on 4L [place].  Pt. Placed on Bipap @ 30% by RT

## 2015-08-12 NOTE — Plan of Care (Signed)
Problem: Spiritual Needs Goal: Ability to function at adequate level Outcome: Progressing Pt. Anxious but cooperative. Gave PRN xanax, lots of information and emotional support as needed  Problem: Safety: Goal: Ability to remain free from injury will improve Outcome: Progressing Pt. Calls for assistance when getting OOB 2 BSC. Pt. Steady on feet but very dsypneic requiring contact guard to standby assist.  Problem: Health Behavior/Discharge Planning: Goal: Ability to manage health-related needs will improve Outcome: Progressing Care management consult placed by day shift to address pt.'s inability to get all her meds all the time  Problem: Activity: Goal: Risk for activity intolerance will decrease Outcome: Not Progressing Pt. Still having dsypnea when not on Bipap.   Problem: ICU Phase Progression Outcomes Goal: O2 sats trending toward baseline Outcome: Progressing O2 Sats > 92% on 4 L [place]/Bipap Goal: Dyspnea controlled at rest Outcome: Not Progressing While on 4 L [place] pt. Is dsypneic, on Bipap no dyspnea Goal: Hemodynamically stable Outcome: Progressing Pt. Is slightly tachycardic when on [place], HR WDL on Bipap- other VSS Goal: Voiding-avoid urinary catheter unless indicated Outcome: Progressing Pt. OOB w/ assistance to [facility]  Problem: Phase I Progression Outcomes Goal: Tolerating diet Outcome: Not Applicable Date Met:  07/31/92 Pt. Ordered cardiac diet, UTA O/N

## 2015-08-12 NOTE — Progress Notes (Signed)
[facility] Physicians - [place] at [facility]   PATIENT NAME: Sarah Mckee    MR#:  [id number]  DATE OF BIRTH:  09-28-1945  SUBJECTIVE:  Breathing improved still not at baseline  REVIEW OF SYSTEMS:  CONSTITUTIONAL: No fever, fatigue or weakness.  EYES: No blurred or double vision.  EARS, NOSE, AND THROAT: No tinnitus or ear pain.  RESPIRATORY: positive cough, shortness of breath, wheezing denies hemoptysis.  CARDIOVASCULAR: No chest pain, orthopnea, edema.  GASTROINTESTINAL: No nausea, vomiting, diarrhea or abdominal pain.  GENITOURINARY: No dysuria, hematuria.  ENDOCRINE: No polyuria, nocturia,  HEMATOLOGY: No anemia, easy bruising or bleeding SKIN: No rash or lesion. MUSCULOSKELETAL: No joint pain or arthritis.   NEUROLOGIC: No tingling, numbness, weakness.  PSYCHIATRY: No anxiety or depression.   DRUG ALLERGIES:   Allergies  Allergen Reactions  . Codeine Shortness Of Breath  . Demerol [Meperidine] Shortness Of Breath  . Morphine And Related Shortness Of Breath  . Oxycodone Shortness Of Breath  . Alendronate     Other reaction(s): Other (See Comments) esophagitis  . Amoxicillin-Pot Clavulanate     Other reaction(s): Unknown  . Risedronate     Other reaction(s): Other (See Comments) epigastric pain    VITALS:  Blood pressure 120/68, pulse 102, temperature 98.1 F (36.7 C), temperature source Oral, resp. rate 16, height  (1.575 m), weight 76.4 kg (168 lb 6.9 oz), SpO2 100 %.  PHYSICAL EXAMINATION:  VITAL SIGNS: Filed Vitals:   08/12/15 1300 08/12/15 1400  BP: 132/74 120/68  Pulse: 113 102  Temp:    Resp: 22 16   GENERAL:70 y.o.female currently in no acute distress.  HEAD: Normocephalic, atraumatic.  EYES: Pupils equal, round, reactive to light. Extraocular muscles intact. No scleral icterus.  MOUTH: Moist mucosal membrane. Dentition intact. No abscess noted.  EAR, NOSE, THROAT: Clear without exudates. No external lesions.  NECK: Supple.  No thyromegaly. No nodules. No JVD.  PULMONARY: expiratory wheeze without rails or rhonci. No use of accessory muscles, Good respiratory effort. good air entry bilaterally CHEST: Nontender to palpation.  CARDIOVASCULAR: S1 and S2. tachycardiac No murmurs, rubs, or gallops. No edema. Pedal pulses 2+ bilaterally.  GASTROINTESTINAL: Soft, nontender, nondistended. No masses. Positive bowel sounds. No hepatosplenomegaly.  MUSCULOSKELETAL: No swelling, clubbing, or edema. Range of motion full in all extremities.  NEUROLOGIC: Cranial nerves II through XII are intact. No gross focal neurological deficits. Sensation intact. Reflexes intact.  SKIN: No ulceration, lesions, rashes, or cyanosis. Skin warm and dry. Turgor intact.  PSYCHIATRIC: Mood, affect within normal limits. The patient is awake, alert and oriented x 3. Insight, judgment intact.      LABORATORY PANEL:   CBC  Recent Labs Lab 08/12/15 0644  WBC 1.9*  HGB 13.2  HCT 39.1  PLT 176   ------------------------------------------------------------------------------------------------------------------  Chemistries   Recent Labs Lab 08/11/15 0926 08/12/15 0644  NA 138 140  K 2.7* 4.1  CL 96* 98*  CO2 37* 34*  GLUCOSE 120* 137*  BUN 14 18  CREATININE 0.68 0.51  CALCIUM 8.6* 9.1  MG 2.1  --    ------------------------------------------------------------------------------------------------------------------  Cardiac Enzymes  Recent Labs Lab 08/11/15 0926  TROPONINI 0.03   ------------------------------------------------------------------------------------------------------------------  RADIOLOGY:  Dg Chest Portable 1 View  08/11/2015  CLINICAL DATA:  increasing SOB all week, known COPD EXAM: PORTABLE CHEST 1 VIEW COMPARISON:  CT 11/18/2014, chest x-ray 11/18/2014 FINDINGS: Normal mediastinum and cardiac silhouette. Lungs are hyperinflated. Normal pulmonary vasculature. No evidence of effusion, infiltrate, or  pneumothorax. No acute  bony abnormality. IMPRESSION: Hyperinflated lungs.  No acute findings. Electronically Signed   By: Genevive BiStewart  Edmunds M.D.   On: 08/11/2015 09:53    EKG:   Orders placed or performed during the hospital encounter of 08/11/15  . EKG 12-Lead  . EKG 12-Lead  . ED EKG  . ED EKG    ASSESSMENT AND PLAN:   70 year old caucasian female admitted 08/11/15 with copd exacerbation  1. Acute respiratory failure with hypoxia Chronic obstructive pulmonary disease exacerbation: secondary to influenza -- tamiflu,Provide DuoNeb treatments q. 4 hours, Solu-Medrol  daily - decrease dose tomorrow Continue with home medications. Wean oxygen goal sao2 88 and above -- on baseline 2L [place]  2.venous thromboembolism prophylactic : lovenox   Disposition: transfer out of icu  All the records are reviewed and case discussed with Care Management/Social Workerr. Management plans discussed with the patient, family and they are in agreement.  CODE STATUS: full  TOTAL TIME TAKING CARE OF THIS PATIENT: 28 minutes.   POSSIBLE D/C IN 1-2 DAYS, DEPENDING ON CLINICAL CONDITION.   Hower,  Mardi MainlandDavid K M.D on 08/12/2015 at 3:36 PM  Between 7am to 6pm - Pager - [phone]  After 6pm: House Pager: - [phone]  [facility]  Office  [phone]  CC: Primary care physician; Patrice ParadiseMCLAUGHLIN, MIRIAM K, MD

## 2015-08-12 NOTE — Progress Notes (Addendum)
Dr hower notifed of wct down from 7.9 to 1.9 in 24hrs and tachycardia 116-130 .bp 148/88. md acknowledged . No chg except he will add ivf

## 2015-08-12 NOTE — Progress Notes (Signed)
LCSW met with patient while she was in ICU- Literature was provided to patient to support her current issues with her spouse. She has agreed to stay in touch with her church members and her daughter and reach out for support as required. She was provided a support plan and she has agreed to safety plans ( ei  Ask for help/access resources if needed).  [employer] LCSW [phone]

## 2015-08-12 NOTE — Progress Notes (Signed)
Patient tolerated svn treatment well. Has stated if she is sleeping, please do not wake up for svn. Patient to have RN to call RT is treatment is needed.

## 2015-08-12 NOTE — Progress Notes (Signed)
LCSW spoke with patient and she will return home with home health services. Assessment completed and touched base briefly with Care Manager Bonita QuinLinda to inform her patients plan is to return home.  Sarah Kabler LCSW

## 2015-08-12 NOTE — Progress Notes (Signed)
Patient and husband yelling at each other. Patient tearful and heart rate in 140's. Husband asked to step out to waiting room until patient calmed down, he complied but stated he "didn't know why he had to." Sarah Mckee stated the argument started when she asked him to read her her heart rate and he asked her to clarify her heart or her pulse rate, in which she didn't know the difference. The confusion apparently led to a confrontation between the two. The patient stated this has happened often and she feels she overracted but that "he knows what buttons to push" in order for her to react that way. Spoke to Ghanalaudia and Samantha, social workers, who both stated they would be up here to speak with the patient around 1:30.

## 2015-08-12 NOTE — Progress Notes (Signed)
Report called to Amanda, RN on 1A.   

## 2015-08-13 DIAGNOSIS — J129 Viral pneumonia, unspecified: Secondary | ICD-10-CM | POA: Diagnosis not present

## 2015-08-13 DIAGNOSIS — J111 Influenza due to unidentified influenza virus with other respiratory manifestations: Secondary | ICD-10-CM | POA: Diagnosis not present

## 2015-08-13 DIAGNOSIS — J9602 Acute respiratory failure with hypercapnia: Secondary | ICD-10-CM

## 2015-08-13 DIAGNOSIS — J9601 Acute respiratory failure with hypoxia: Secondary | ICD-10-CM | POA: Diagnosis not present

## 2015-08-13 DIAGNOSIS — J9600 Acute respiratory failure, unspecified whether with hypoxia or hypercapnia: Secondary | ICD-10-CM | POA: Diagnosis not present

## 2015-08-13 DIAGNOSIS — R0689 Other abnormalities of breathing: Secondary | ICD-10-CM

## 2015-08-13 DIAGNOSIS — J101 Influenza due to other identified influenza virus with other respiratory manifestations: Principal | ICD-10-CM

## 2015-08-13 DIAGNOSIS — J441 Chronic obstructive pulmonary disease with (acute) exacerbation: Secondary | ICD-10-CM | POA: Diagnosis not present

## 2015-08-13 DIAGNOSIS — R400 Somnolence: Secondary | ICD-10-CM

## 2015-08-13 LAB — BLOOD GAS, ARTERIAL
ACID-BASE EXCESS: 18.1 mmol/L — AB (ref 0.0–3.0)
Allens test (pass/fail): POSITIVE — AB
BICARBONATE: 44.5 meq/L — AB (ref 21.0–28.0)
FIO2: 36
O2 SAT: 99.7 %
PCO2 ART: 57 mmHg — AB (ref 32.0–48.0)
Patient temperature: 37
pH, Arterial: 7.5 — ABNORMAL HIGH (ref 7.350–7.450)
pO2, Arterial: 179 mmHg — ABNORMAL HIGH (ref 83.0–108.0)

## 2015-08-13 LAB — CBC
HCT: 39.2 % (ref 35.0–47.0)
HEMOGLOBIN: 13.1 g/dL (ref 12.0–16.0)
MCH: 29.2 pg (ref 26.0–34.0)
MCHC: 33.3 g/dL (ref 32.0–36.0)
MCV: 87.6 fL (ref 80.0–100.0)
Platelets: 169 10*3/uL (ref 150–440)
RBC: 4.47 MIL/uL (ref 3.80–5.20)
RDW: 13.8 % (ref 11.5–14.5)
WBC: 4.9 10*3/uL (ref 3.6–11.0)

## 2015-08-13 LAB — ECHOCARDIOGRAM COMPLETE
Height: 62 in
WEIGHTICAEL: 2694.9 [oz_av]

## 2015-08-13 LAB — GLUCOSE, CAPILLARY
GLUCOSE-CAPILLARY: 117 mg/dL — AB (ref 65–99)
GLUCOSE-CAPILLARY: 151 mg/dL — AB (ref 65–99)

## 2015-08-13 MED ORDER — ALPRAZOLAM 0.25 MG PO TABS
0.2500 mg | ORAL_TABLET | Freq: Once | ORAL | Status: AC
  Administered 2015-08-13: 0.25 mg via ORAL

## 2015-08-13 MED ORDER — TIOTROPIUM BROMIDE MONOHYDRATE 18 MCG IN CAPS
18.0000 ug | ORAL_CAPSULE | Freq: Every day | RESPIRATORY_TRACT | Status: DC
  Administered 2015-08-16: 18 ug via RESPIRATORY_TRACT

## 2015-08-13 MED ORDER — ALPRAZOLAM 0.25 MG PO TABS
0.2500 mg | ORAL_TABLET | Freq: Three times a day (TID) | ORAL | Status: DC | PRN
Start: 2015-08-13 — End: 2015-08-16
  Administered 2015-08-14 – 2015-08-16 (×4): 0.25 mg via ORAL
  Filled 2015-08-13 (×4): qty 1

## 2015-08-13 MED ORDER — ALBUTEROL SULFATE HFA 108 (90 BASE) MCG/ACT IN AERS
1.0000 | INHALATION_SPRAY | RESPIRATORY_TRACT | Status: DC | PRN
  Filled 2015-08-13 (×2): qty 6.7

## 2015-08-13 MED ORDER — ALPRAZOLAM 0.25 MG PO TABS
0.2500 mg | ORAL_TABLET | Freq: Every day | ORAL | Status: DC
  Administered 2015-08-13 – 2015-08-15 (×3): 0.25 mg via ORAL
  Filled 2015-08-13 (×3): qty 1

## 2015-08-13 MED ORDER — AZITHROMYCIN 250 MG PO TABS
500.0000 mg | ORAL_TABLET | Freq: Every day | ORAL | Status: AC
  Administered 2015-08-13 – 2015-08-15 (×3): 500 mg via ORAL
  Filled 2015-08-13 (×3): qty 2

## 2015-08-13 MED ORDER — MOMETASONE FURO-FORMOTEROL FUM 100-5 MCG/ACT IN AERO
2.0000 | INHALATION_SPRAY | Freq: Two times a day (BID) | RESPIRATORY_TRACT | Status: DC
  Administered 2015-08-15 – 2015-08-16 (×3): 2 via RESPIRATORY_TRACT
  Filled 2015-08-13: qty 8.8

## 2015-08-13 MED ORDER — NYSTATIN 100000 UNIT/ML MT SUSP
5.0000 mL | Freq: Four times a day (QID) | OROMUCOSAL | Status: DC
  Administered 2015-08-13 – 2015-08-16 (×13): 500000 [IU] via ORAL
  Filled 2015-08-13 (×15): qty 5

## 2015-08-13 NOTE — Care Management (Signed)
Patient transferred to 1A from icu on 3/26 but required transfer back to icu 3/27 due to need for continuous bipap.  Patient with COPD and admitted with exac of this and tested positive for flu.   CM will assess patient when respiratory status improves.  Upon arrival to icu, she is to short of breath to speak

## 2015-08-13 NOTE — Progress Notes (Signed)
Initial Nutrition Assessment   INTERVENTION:   Meals and Snacks: Cater to patient preferences Medical Food Supplement Therapy: will recommend on follow if intake poor   NUTRITION DIAGNOSIS:   Inadequate oral intake related to acute illness as evidenced by meal completion < 25%.  GOAL:   Patient will meet greater than or equal to 90% of their needs  MONITOR:   PO intake, Supplement acceptance, Labs, Weight trends, I & O's  REASON FOR ASSESSMENT:   Consult  (Other)  ASSESSMENT:   Pt admitted with SOB and anxiety with acute respiratory failure and COPD exacerbation secondary to influenza.   Per chart review pt with rapid response called last night, pt moved to ICU on bipap. Pt unavailable on rounds this afternoon.   Past Medical History  Diagnosis Date  . COPD (chronic obstructive pulmonary disease) (HCC)     2L 02 at baseline  . Hypertension   . Arthritis   . Pulmonary HTN (HCC)     Diet Order:  Diet Heart Room service appropriate?: Yes; Fluid consistency:: Thin   Current Nutrition: Pt lunch tray on cart, untouched from lunch. Per documentation pt ate 75% of breakfast this am. Yesterday for breakfast 100% recorded.   Food/Nutrition-Related History: Per MST no decrease in appetite PTA.   Scheduled Medications:  . azithromycin  500 mg Oral Daily  . budesonide (PULMICORT) nebulizer solution  0.5 mg Nebulization BID  . enoxaparin (LOVENOX) injection  40 mg Subcutaneous Q24H  . Influenza vac split quadrivalent PF  0.5 mL Intramuscular Tomorrow-1000  . ipratropium-albuterol  3 mL Nebulization Q4H  . methylPREDNISolone (SOLU-MEDROL) injection  40 mg Intravenous Q12H  . [START ON 08/15/2015] mometasone-formoterol  2 puff Inhalation BID  . montelukast  10 mg Oral Daily  . nystatin  5 mL Oral QID  . oseltamivir  75 mg Oral BID  . sodium chloride flush  3 mL Intravenous Q12H  . [START ON 08/16/2015] tiotropium  18 mcg Inhalation Daily  . triamterene-hydrochlorothiazide   1 tablet Oral Daily     Electrolyte/Renal Profile and Glucose Profile:   Recent Labs Lab 08/11/15 0926 08/12/15 0644  NA 138 140  K 2.7* 4.1  CL 96* 98*  CO2 37* 34*  BUN 14 18  CREATININE 0.68 0.51  CALCIUM 8.6* 9.1  MG 2.1  --   GLUCOSE 120* 137*   Protein Profile: No results for input(s): ALBUMIN in the last 168 hours.  Gastrointestinal Profile: Last BM: 08/12/2015   Nutrition-Focused Physical Exam Findings:  Unable to complete Nutrition-Focused physical exam at this time.    Weight Change: Per [facility] weight encounters, pt with 12.5% weight loss in 8 months   Height:   Ht Readings from Last 1 Encounters:  08/11/15 5\' 2"  (1.575 m)    Weight:   Wt Readings from Last 1 Encounters:  08/11/15 168 lb 6.9 oz (76.4 kg)   Wt Readings from Last 10 Encounters:  08/11/15 168 lb 6.9 oz (76.4 kg)  12/02/14 192 lb (87.091 kg)    Ideal Body Weight:   50kg  BMI:  Body mass index is 30.8 kg/(m^2).  Estimated Nutritional Needs:   Kcal:  using IBW of 50kg, BEE: (IF 1.1-1.3)(AF 1.2) 1300-1540kcals  Protein:  50-60g protein (1.0-1.2g/kg)  Fluid:  1250-155900mL of fluid (25-7430mL/kg)  EDUCATION NEEDS:   No education needs identified at this time   MODERATE Care Level   Leda QuailAllyson Bunny Lowdermilk, RD, LDN Pager [phone] Weekend/On-Call Pager [phone]

## 2015-08-13 NOTE — Progress Notes (Signed)
Pt states she called a rapid response for herself

## 2015-08-13 NOTE — Progress Notes (Signed)
[facility] Physicians - [place] at [facility]   PATIENT NAME: Sarah Mckee    MR#:  [id number]  DATE OF BIRTH:  Jan 15, 1946  SUBJECTIVE:  Lots of issues with breathing and shortness of breath and anxiety overnight at one point she apparently wanted to leave AGAINST MEDICAL ADVICE however convinced not to do so given her medical condition. Informed by nursing staff this morning patient desaturation with increased work of breathing.  REVIEW OF SYSTEMS:  CONSTITUTIONAL: No fever, fatigue or weakness.  EYES: No blurred or double vision.  EARS, NOSE, AND THROAT: No tinnitus or ear pain.  RESPIRATORY: positive cough, shortness of breath, wheezing denies hemoptysis.  CARDIOVASCULAR: No chest pain, orthopnea, edema.  GASTROINTESTINAL: No nausea, vomiting, diarrhea or abdominal pain.  GENITOURINARY: No dysuria, hematuria.  ENDOCRINE: No polyuria, nocturia,  HEMATOLOGY: No anemia, easy bruising or bleeding SKIN: No rash or lesion. MUSCULOSKELETAL: No joint pain or arthritis.   NEUROLOGIC: No tingling, numbness, weakness.  PSYCHIATRY: Positive anxiety  DRUG ALLERGIES:   Allergies  Allergen Reactions  . Codeine Shortness Of Breath  . Demerol [Meperidine] Shortness Of Breath  . Morphine And Related Shortness Of Breath  . Oxycodone Shortness Of Breath  . Alendronate     Other reaction(s): Other (See Comments) esophagitis  . Amoxicillin-Pot Clavulanate     Other reaction(s): Unknown  . Risedronate     Other reaction(s): Other (See Comments) epigastric pain    VITALS:  Blood pressure 128/64, pulse 99, temperature 97.8 F (36.6 C), temperature source Oral, resp. rate 18, height  (1.575 m), weight 76.4 kg (168 lb 6.9 oz), SpO2 98 %.  PHYSICAL EXAMINATION:  VITAL SIGNS: Filed Vitals:   08/13/15 0403 08/13/15 0806  BP: 146/71 128/64  Pulse: 103 99  Temp: 97.9 F (36.6 C) 97.8 F (36.6 C)  Resp: 22 18   GENERAL:70 y.o.female critically ill given respiratory  status HEAD: Normocephalic, atraumatic.  EYES: Pupils equal, round, reactive to light. Extraocular muscles intact. No scleral icterus.  MOUTH: Moist mucosal membrane. Dentition intact. No abscess noted.  EAR, NOSE, THROAT: Clear without exudates. No external lesions.  NECK: Supple. No thyromegaly. No nodules. No JVD.  PULMONARY: expiratory wheeze without rails or rhonci. Tachypneic No use of accessory muscles, poor respiratory effort. Poor air entry bilaterally CHEST: Nontender to palpation.  CARDIOVASCULAR: S1 and S2. tachycardiac No murmurs, rubs, or gallops. No edema. Pedal pulses 2+ bilaterally.  GASTROINTESTINAL: Soft, nontender, nondistended. No masses. Positive bowel sounds. No hepatosplenomegaly.  MUSCULOSKELETAL: No swelling, clubbing, or edema. Range of motion full in all extremities.  NEUROLOGIC: Cranial nerves II through XII are intact. No gross focal neurological deficits. Sensation intact. Reflexes intact.  SKIN: No ulceration, lesions, rashes, or cyanosis. Skin warm and dry. Turgor intact.  PSYCHIATRIC: Mood, affect anxious The patient is awake, alert and oriented x 3. Insight, judgment intact.      LABORATORY PANEL:   CBC  Recent Labs Lab 08/12/15 0644  WBC 1.9*  HGB 13.2  HCT 39.1  PLT 176   ------------------------------------------------------------------------------------------------------------------  Chemistries   Recent Labs Lab 08/11/15 0926 08/12/15 0644  NA 138 140  K 2.7* 4.1  CL 96* 98*  CO2 37* 34*  GLUCOSE 120* 137*  BUN 14 18  CREATININE 0.68 0.51  CALCIUM 8.6* 9.1  MG 2.1  --    ------------------------------------------------------------------------------------------------------------------  Cardiac Enzymes  Recent Labs Lab 08/11/15 0926  TROPONINI 0.03   ------------------------------------------------------------------------------------------------------------------  RADIOLOGY:  No results found.  EKG:   Orders placed  or performed during the hospital encounter of 08/11/15  . EKG 12-Lead  . EKG 12-Lead  . ED EKG  . ED EKG    ASSESSMENT AND PLAN:   70 year old caucasian female admitted 08/11/15 with copd exacerbation  1. Acute respiratory failure with hypoxia Chronic obstructive pulmonary disease exacerbation: secondary to influenza -- tamiflu,Provide DuoNeb treatments q. 4 hours, Solu-Medrol  continued home medications  Given new increased work of breathing will start patient on BiPAP therapy wile still on orthopedic floor-transfer order already in the stepdown unit and move her when available  2.venous thromboembolism prophylactic : lovenox   Disposition: transfer back to stepdown given increased work of breathing and need for BiPAP  All the records are reviewed and case discussed with Care Management/Social Workerr. Management plans discussed with the patient, family and they are in agreement.  CODE STATUS: full  TOTAL TIME TAKING CARE OF THIS PATIENT: 45 critical care  POSSIBLE D/C IN 2-3 DAYS, DEPENDING ON CLINICAL CONDITION.   Sehaj Kolden,  Mardi MainlandDavid K M.D on 08/13/2015 at 9:41 AM  Between 7am to 6pm - Pager - [phone]  After 6pm: House Pager: - [phone]  [facility]  Office  [phone]  CC: Primary care physician; Patrice ParadiseMCLAUGHLIN, MIRIAM K, MD

## 2015-08-13 NOTE — Progress Notes (Signed)
Pt sent to ICU on bipap via bed by me and respiratory.  Pt's daughter catherine notified of the transfer

## 2015-08-13 NOTE — Progress Notes (Signed)
Put Pt on BIPAP for the night. Pt began coughing while wearing BIPAP and became extremely anxious. Pt's HR and RR increased and O2 saturation dropped into 70's. Took Pt off BIPAP and put her back on nasal cannula. She was able to calm down and vitals returned to normal shortly after. Pt is now resting quietly on 2L [place] and doesn't appear to be in any distress. Will continue to monitor

## 2015-08-13 NOTE — Significant Event (Signed)
Rapid Response Event Note  Overview:  RR called because patient insisted she needed help.  Patient wants to leave AMA because she cannot get her rescue inhaler, stating she can use her inhaler at home, and has called her daughter to get her.     Initial Focused Assessment: Pt sitting at side of the bed, increased work of breathing, tachypneic, audible wheezing, and highly anxious.  Patient insisting to leave AMA because she cannot get her rescue inhaler.  Patient asking for her xanax and something for her "thrush" in her mouth and throat.    Interventions:  Patient given albuterol nebulizer.  Spoke to Dr. Sheryle Hailiamond about patient wanting her inhaler and something for anxiety.  Spoke to patient's daughter about the need to have the patient stay in the hospital as she is not well enough to  Agreed to have patient's daughter bring the patient's inhaler and order placed for PRN use.  Orders placed for one time dose of xanax 0.25mg .  Orders placed for oral nystatin. Patient agreed to stay and not leave AMA.   Event Summary:   RR called at 0410      Ended at 0500    Dutch QuintPatel, Pamelyn Bancroft D

## 2015-08-13 NOTE — Progress Notes (Signed)
Report called to Myra, RN on ICU. Pt to be transferred on bipap to room 12. Pt given 0.25 of xanex for anxiety.

## 2015-08-13 NOTE — Progress Notes (Signed)
   08/13/15 0938  BiPAP/CPAP/SIPAP  BiPAP/CPAP/SIPAP Pt Type Adult  Mask Type Full face mask  Mask Size Medium  Set Rate 10 breaths/min  Respiratory Rate 22 breaths/min  IPAP 10 cmH20  EPAP 5 cmH2O  Oxygen Percent 30 %  Minute Ventilation 10.4  Leak 6  Peak Inspiratory Pressure (PIP) 11  Tidal Volume (Vt) 459  BiPAP/CPAP/SIPAP BiPAP  Press High Alarm 20 cmH2O  Press Low Alarm 2 cmH2O  Placed patient on Bipap for increased work of breathing per MD. RN and MD at bedside.

## 2015-08-13 NOTE — Progress Notes (Signed)
Spoke with Dr Sheryle Hailiamond regarding pt wanting to leave AMA because she is not getting the help she needs. He stated rescue inhalers are not available to patients. He stated he could increase the dosage for nebulizer if he needed to

## 2015-08-13 NOTE — Progress Notes (Signed)
Patient upset and agitated. States we( the pharmacy0 has her Proair inhaler and she wants it now. Pharmacy searched for it on 1A and in the actual pharmacy with no results. Replaced with Proventolin per pharmacy.

## 2015-08-13 NOTE — Progress Notes (Signed)
Pt initiated rapid response due to sob. O2 sat 99 on 3 L. Positive for use of acc. Muscles. Very apprehensive but improved after svn.

## 2015-08-13 NOTE — Care Management (Signed)
Patient is respiratory distress. Transferring to ICU. CM notified.

## 2015-08-13 NOTE — Consult Note (Signed)
PULMONARY / CRITICAL CARE MEDICINE   Name: Sarah MedalJanet Besancon MRN: [id number] DOB: 06-21-1945    ADMISSION DATE:  08/11/2015  BRIEF HISTORY: 70 y.o. female with a known history of chronic respiratory failure on 2 L of oxygen for COPD, history of pulmonary hypertension I's and osteoarthritis who states that she's been having progressive shortness of breath for the past 2 weeks. Found to have Influ A.  Transferred out the ICU on 3/26, transferred back to ICU on 3/27 am for somnolence and respiratory failure requiring bipap.   SUBJECTIVE:  Patienttransferred back to ICU this morning due to increased somnolence and respiratory failure requiring BiPAP therapy. Upon arrival to the ICU she is noted to be mildly somnolent on the BiPAP, easily awakened and follows commands.  Per review of chart she was noted to have increased he saturations and increased work of breathing, requiring BiPAP.  STUDIES:  Echocardiogram 08/12/2015- EF 50-55%, normal systolic function. SIGNIFICANT EVENTS: 08/11/2015-admitted for shortness of breath, requiring BiPAP, try to the ICU. Transferred out of the ICU on 08/12/2015 08/13/2015- transfer back to the ICU for increased somnolence, increase breathing,desaturations, and need for BiPAP therapy.   VITAL SIGNS: Temp:  [97.6 F (36.4 C)-98.2 F (36.8 C)] 98.2 F (36.8 C) (03/27 1041) Pulse Rate:  [91-116] 99 (03/27 0806) Resp:  [16-22] 18 (03/27 0806) BP: (120-148)/(59-88) 130/72 mmHg (03/27 1041) SpO2:  [92 %-100 %] 92 % (03/27 1143) FiO2 (%):  [36 %] 36 % (03/26 1502) HEMODYNAMICS:   VENTILATOR SETTINGS: Vent Mode:  [-]  FiO2 (%):  [36 %] 36 % INTAKE / OUTPUT:  Intake/Output Summary (Last 24 hours) at 08/13/15 1318 Last data filed at 08/13/15 0900  Gross per 24 hour  Intake    360 ml  Output    200 ml  Net    160 ml    Review of Systems  Constitutional: Negative for fever, chills and weight loss.  Eyes: Negative for blurred vision.  Respiratory: Positive  for shortness of breath and wheezing.   Cardiovascular: Negative for chest pain.  Gastrointestinal: Negative for heartburn and nausea.  Genitourinary: Negative for dysuria.  Musculoskeletal: Negative for myalgias.  Skin: Negative for rash.  Neurological: Negative for dizziness and headaches.  Endo/Heme/Allergies: Does not bruise/bleed easily.    Physical Exam  Constitutional: She is well-developed, well-nourished, and in no distress.  HENT:  Head: Normocephalic and atraumatic.  Right Ear: External ear normal.  Left Ear: External ear normal.  Eyes: Pupils are equal, round, and reactive to light.  Neck: Normal range of motion. Neck supple.  Cardiovascular: Normal rate and normal heart sounds.   Pulmonary/Chest: She is in respiratory distress. She has wheezes. She has no rales. She exhibits no tenderness.  Shallow breath sounds.   Abdominal: Soft. Bowel sounds are normal. She exhibits no distension. There is no tenderness.  Musculoskeletal: Normal range of motion.  Neurological: She is alert.  Somnolent but easily arousable  Skin: Skin is warm and dry.  Psychiatric: Affect normal.  Nursing note and vitals reviewed.    LABS:  CBC  Recent Labs Lab 08/11/15 0926 08/12/15 0644  WBC 7.7 1.9*  HGB 13.1 13.2  HCT 39.8 39.1  PLT 178 176   Coag's No results for input(s): APTT, INR in the last 168 hours. BMET  Recent Labs Lab 08/11/15 0926 08/12/15 0644  NA 138 140  K 2.7* 4.1  CL 96* 98*  CO2 37* 34*  BUN 14 18  CREATININE 0.68 0.51  GLUCOSE 120* 137*  Electrolytes  Recent Labs Lab 08/11/15 0926 08/12/15 0644  CALCIUM 8.6* 9.1  MG 2.1  --    Sepsis Markers  Recent Labs Lab 08/11/15 0926 08/11/15 1305  LATICACIDVEN 1.8 2.0   ABG No results for input(s): PHART, PCO2ART, PO2ART in the last 168 hours. Liver Enzymes No results for input(s): AST, ALT, ALKPHOS, BILITOT, ALBUMIN in the last 168 hours. Cardiac Enzymes  Recent Labs Lab 08/11/15 0926   TROPONINI 0.03   Glucose  Recent Labs Lab 08/11/15 1350 08/12/15 2210 08/13/15 0807  GLUCAP 147* 165* 117*    Imaging No results found.  LINES:   CULTURES: Influenza PCR-positive for influenza A  ANTIBIOTICS Azithromycin 3/27>>3/30  ASSESSMENT / PLAN: 70 year old female with past medical history ofCOPD, chronic respiratory failure, now with influenza A, seen in consultation for respiratory failure  Acute on chronic respiratory failure COPD COPD exacerbation Influenza A viral pneumonia CO2 narcosis/ hypercapnia  Plan: -Continue with BiPAP, will plan for BiPAP 2 hours in the morning, 2 hours in the afternoon, and 6 hours at night. We'll continue this for the next 24-48 hours. I suspected influenza A. Is causing worsening respiratory failure and CO2 narcosis, in this case BiPAP will be very beneficial. -DuoNeb every 4 hours scheduled -Azithromycin -Maintain O2 saturations 88-94% -Stop Mucinex -Pulmicort nebulizers -Continue it Solu-Medrol 40 mg IV twice a day, consider transitioning to by mouth tapering steroid dose in the next 1-2 days. -Continue with Tamiflu course -Avoid sedatives, neurodepressive drugs given her somnolence and CO2 narcosis -chest x-ray in the morning -hold inhalers while in the ICU   Thank you for consulting [facility], Please feel free to contacts [place] with any questions at [phone] (please enter 7-digits).  I have personally obtained a history, examined the patient, evaluated laboratory and imaging results, formulated the assessment and plan and placed orders.  pulmonary Time devoted to patient care services described in this note is 40 minutes.   Overall, patient is critically ill, prognosis is guarded. Patient at high risk for cardiac arrest and death.    Stephanie Acre, MD [facility] Pager [phone] (please enter 7-digits) On Call Pager - [phone] (please enter  7-digits)  Note: This note was prepared with Dragon dictation along with smaller phrase technology. Any transcriptional errors that result from this process are unintentional.

## 2015-08-13 NOTE — Progress Notes (Signed)
Pt requesting rescue inhaler. I called pharmacy to see what we had on formulary. Pharmacist said we do not have rescue inhalers, nebulizers are used instead. Pt wasn't happy about that. She stated she does not want to be bothered unless she calls us. She stated she did not want am labs drawn. Also, did not want respiratory back in her room unless she called for them.

## 2015-08-13 NOTE — Progress Notes (Signed)
Xanax given  Earlier than due.  Dr Clint GuyHower said to go ahead and give it so pt can be put on bipap

## 2015-08-13 NOTE — Progress Notes (Signed)
Pt wants to sign out AMA. She states she is not getting the inhaler she needs. She has called her daughter who is coming from [place] to pick her up. Pt was told earlier in the shift we have nebulizer treatments not rescue inhalers

## 2015-08-13 NOTE — Progress Notes (Signed)
1630 Upset and agitated. Blessed out Nurse and NA. Family also upset. States no one has given her any care or checked on her. This unit was rude etc.to her. Reported she had to call her own rapid response last nite. Now she is in this shape because she was moved from ICU too early. Now we were all mad at her etc.Michael in pharmacy never spoke to her today and did not even call about the inhaler. 1700 [facility] to [facility] ICU manager. He talked with Patient. More satified.

## 2015-08-14 ENCOUNTER — Inpatient Hospital Stay: Payer: PPO

## 2015-08-14 DIAGNOSIS — J111 Influenza due to unidentified influenza virus with other respiratory manifestations: Secondary | ICD-10-CM | POA: Diagnosis not present

## 2015-08-14 DIAGNOSIS — J9601 Acute respiratory failure with hypoxia: Secondary | ICD-10-CM | POA: Diagnosis not present

## 2015-08-14 DIAGNOSIS — J9690 Respiratory failure, unspecified, unspecified whether with hypoxia or hypercapnia: Secondary | ICD-10-CM | POA: Diagnosis not present

## 2015-08-14 DIAGNOSIS — J441 Chronic obstructive pulmonary disease with (acute) exacerbation: Secondary | ICD-10-CM | POA: Diagnosis not present

## 2015-08-14 DIAGNOSIS — J9602 Acute respiratory failure with hypercapnia: Secondary | ICD-10-CM | POA: Diagnosis not present

## 2015-08-14 DIAGNOSIS — J101 Influenza due to other identified influenza virus with other respiratory manifestations: Secondary | ICD-10-CM | POA: Diagnosis not present

## 2015-08-14 DIAGNOSIS — J9600 Acute respiratory failure, unspecified whether with hypoxia or hypercapnia: Secondary | ICD-10-CM | POA: Diagnosis not present

## 2015-08-14 NOTE — Progress Notes (Signed)
[facility] [place] at [facility]   PATIENT NAME: Sarah Mckee    MR#:  [id number]  DATE OF BIRTH:  September 14, 1945  SUBJECTIVE:  Agitation overnight, most of her complaints regard care she is receiving however, fortunately her respiratory status improved  REVIEW OF SYSTEMS:  CONSTITUTIONAL: No fever, fatigue or weakness.  EYES: No blurred or double vision.  EARS, NOSE, AND THROAT: No tinnitus or ear pain.  RESPIRATORY: positive cough, shortness of breath, wheezing denies hemoptysis.  CARDIOVASCULAR: No chest pain, orthopnea, edema.  GASTROINTESTINAL: No nausea, vomiting, diarrhea or abdominal pain.  GENITOURINARY: No dysuria, hematuria.  ENDOCRINE: No polyuria, nocturia,  HEMATOLOGY: No anemia, easy bruising or bleeding SKIN: No rash or lesion. MUSCULOSKELETAL: No joint pain or arthritis.   NEUROLOGIC: No tingling, numbness, weakness.  PSYCHIATRY: Positive anxiety  DRUG ALLERGIES:   Allergies  Allergen Reactions  . Codeine Shortness Of Breath  . Demerol [Meperidine] Shortness Of Breath  . Morphine And Related Shortness Of Breath  . Oxycodone Shortness Of Breath  . Alendronate     Other reaction(s): Other (See Comments) esophagitis  . Amoxicillin-Pot Clavulanate     Other reaction(s): Unknown  . Risedronate     Other reaction(s): Other (See Comments) epigastric pain    VITALS:  Blood pressure 137/67, pulse 102, temperature 97.6 F (36.4 C), temperature source Axillary, resp. rate 23, height  (1.575 m), weight 76.4 kg (168 lb 6.9 oz), SpO2 99 %.  PHYSICAL EXAMINATION:  VITAL SIGNS: Filed Vitals:   08/14/15 1218 08/14/15 1222  BP:  137/67  Pulse:  102  Temp: 97.6 F (36.4 C)   Resp:  23   GENERAL:70 y.o.female critically ill given respiratory status HEAD: Normocephalic, atraumatic.  EYES: Pupils equal, round, reactive to light. Extraocular muscles intact. No scleral icterus.  MOUTH: Moist mucosal membrane. Dentition intact. No  abscess noted.  EAR, NOSE, THROAT: Clear without exudates. No external lesions.  NECK: Supple. No thyromegaly. No nodules. No JVD.  PULMONARY: expiratory wheeze without rails or rhonci. Tachypneic No use of accessory muscles, poor respiratory effort. Poor air entry bilaterally CHEST: Nontender to palpation.  CARDIOVASCULAR: S1 and S2. tachycardiac No murmurs, rubs, or gallops. No edema. Pedal pulses 2+ bilaterally.  GASTROINTESTINAL: Soft, nontender, nondistended. No masses. Positive bowel sounds. No hepatosplenomegaly.  MUSCULOSKELETAL: No swelling, clubbing, or edema. Range of motion full in all extremities.  NEUROLOGIC: Cranial nerves II through XII are intact. No gross focal neurological deficits. Sensation intact. Reflexes intact.  SKIN: No ulceration, lesions, rashes, or cyanosis. Skin warm and dry. Turgor intact.  PSYCHIATRIC: Mood, affect anxious The patient is awake, alert and oriented x 3. Insight, judgment intact.      LABORATORY PANEL:   CBC  Recent Labs Lab 08/13/15 1343  WBC 4.9  HGB 13.1  HCT 39.2  PLT 169   ------------------------------------------------------------------------------------------------------------------  Chemistries   Recent Labs Lab 08/11/15 0926 08/12/15 0644  NA 138 140  K 2.7* 4.1  CL 96* 98*  CO2 37* 34*  GLUCOSE 120* 137*  BUN 14 18  CREATININE 0.68 0.51  CALCIUM 8.6* 9.1  MG 2.1  --    ------------------------------------------------------------------------------------------------------------------  Cardiac Enzymes  Recent Labs Lab 08/11/15 0926  TROPONINI 0.03   ------------------------------------------------------------------------------------------------------------------  RADIOLOGY:  Dg Chest Port 1 View  08/14/2015  CLINICAL DATA:  Respiratory failure EXAM: PORTABLE CHEST 1 VIEW COMPARISON:  08/11/2015 FINDINGS: The heart size and mediastinal contours are within normal limits. Both lungs are clear. The visualized  skeletal structures are  unremarkable. IMPRESSION: No active disease. Electronically Signed   By: Alcide CleverMark  Lukens M.D.   On: 08/14/2015 07:30    EKG:   Orders placed or performed during the hospital encounter of 08/11/15  . EKG 12-Lead  . EKG 12-Lead  . ED EKG  . ED EKG    ASSESSMENT AND PLAN:   70 year old caucasian female admitted 08/11/15 with copd exacerbation  1. Acute respiratory failure with hypoxia Chronic obstructive pulmonary disease exacerbation: secondary to influenza -- tamiflu,Provide DuoNeb treatments q. 4 hours, Solu-Medrol  continued home medications - remains in ICU BiPAP as required  2.venous thromboembolism prophylactic : lovenox   Disposition: transfer back to stepdown given increased work of breathing and need for BiPAP  All the records are reviewed and case discussed with Care Management/Social Workerr. Management plans discussed with the patient, family and they are in agreement.  CODE STATUS: full  TOTAL TIME TAKING CARE OF THIS PATIENT:28 critical care  POSSIBLE D/C IN 2-3 DAYS, DEPENDING ON CLINICAL CONDITION.   Chinmay Squier,  Mardi MainlandDavid K M.D on 08/14/2015 at 2:39 PM  Between 7am to 6pm - Pager - [phone]  After 6pm: House Pager: - [phone]  [facility]  Office  [phone]  CC: Primary care physician; Patrice ParadiseMCLAUGHLIN, MIRIAM K, MD

## 2015-08-14 NOTE — Progress Notes (Signed)
Pt refuses to go back on BIPAP. Resting quietly on 2L [place]. O2 saturation 96%. Other vitals normal. Denies any pain or discomfort. Will continue to monitor closely.

## 2015-08-14 NOTE — Progress Notes (Signed)
PULMONARY / CRITICAL CARE MEDICINE   Name: Sarah MedalJanet Natividad MRN: [id number] DOB: February 12, 1946    ADMISSION DATE:  08/11/2015  BRIEF HISTORY: 70 y.o. female with a known history of chronic respiratory failure on 2 L of oxygen for COPD, history of pulmonary hypertension I's and osteoarthritis who states that she's been having progressive shortness of breath for the past 2 weeks. Found to have Influ A. Transferred out the ICU on 3/26, transferred back to ICU on 3/27 am for somnolence and respiratory failure requiring bipap.   SUBJECTIVE:  Patient doing well this morning, more alert and awake compared to yesterday, mild wheezes.  Tolerated BiPAP well overnight   STUDIES:  Echocardiogram 08/12/2015- EF 50-55%, normal systolic function. SIGNIFICANT EVENTS: 08/11/2015-admitted for shortness of breath, requiring BiPAP, try to the ICU. Transferred out of the ICU on 08/12/2015 08/13/2015- transfer back to the ICU for increased somnolence, increase breathing,desaturations, and need for BiPAP therapy.  VITAL SIGNS: Temp:  [97.2 F (36.2 C)-98.2 F (36.8 C)] 97.8 F (36.6 C) (03/28 0800) Pulse Rate:  [75-116] 85 (03/28 0800) Resp:  [14-27] 15 (03/28 0800) BP: (92-155)/(47-95) 115/62 mmHg (03/28 0800) SpO2:  [87 %-100 %] 99 % (03/28 0922) HEMODYNAMICS:   VENTILATOR SETTINGS:   INTAKE / OUTPUT:  Intake/Output Summary (Last 24 hours) at 08/14/15 0923 Last data filed at 08/14/15 [id number]  Gross per 24 hour  Intake      0 ml  Output    150 ml  Net   -150 ml    Review of Systems  Constitutional: Negative for fever, chills, weight loss and malaise/fatigue.  Eyes: Negative for blurred vision.  Respiratory: Positive for cough, shortness of breath and wheezing.   Cardiovascular: Negative for chest pain.  Gastrointestinal: Negative for heartburn, nausea and vomiting.  Genitourinary: Negative for dysuria.  Musculoskeletal: Negative for myalgias.  Skin: Negative for rash.  Neurological: Negative  for dizziness and headaches.  Endo/Heme/Allergies: Does not bruise/bleed easily.    Physical Exam  Constitutional: She is oriented to person, place, and time and well-developed, well-nourished, and in no distress.  HENT:  Head: Normocephalic and atraumatic.  Right Ear: External ear normal.  Left Ear: External ear normal.  Nose: Nose normal.  Mouth/Throat: Oropharynx is clear and moist.  Eyes: Pupils are equal, round, and reactive to light.  Neck: Normal range of motion. Neck supple.  Cardiovascular: Normal rate, regular rhythm, normal heart sounds and intact distal pulses.   Pulmonary/Chest: She has no rales. She exhibits no tenderness.  Mild exp wheezes  Abdominal: Soft. Bowel sounds are normal. She exhibits no distension. There is no tenderness.  Musculoskeletal: Normal range of motion. She exhibits no edema.  Neurological: She is alert and oriented to person, place, and time.  Skin: Skin is warm and dry.  Nursing note and vitals reviewed.    LABS:  CBC  Recent Labs Lab 08/11/15 0926 08/12/15 0644 08/13/15 1343  WBC 7.7 1.9* 4.9  HGB 13.1 13.2 13.1  HCT 39.8 39.1 39.2  PLT 178 176 169   Coag's No results for input(s): APTT, INR in the last 168 hours. BMET  Recent Labs Lab 08/11/15 0926 08/12/15 0644  NA 138 140  K 2.7* 4.1  CL 96* 98*  CO2 37* 34*  BUN 14 18  CREATININE 0.68 0.51  GLUCOSE 120* 137*   Electrolytes  Recent Labs Lab 08/11/15 0926 08/12/15 0644  CALCIUM 8.6* 9.1  MG 2.1  --    Sepsis Markers  Recent Labs Lab 08/11/15 0926 08/11/15  1305  LATICACIDVEN 1.8 2.0   ABG  Recent Labs Lab 08/13/15 1342  PHART 7.50*  PCO2ART 57*  PO2ART 179*   Liver Enzymes No results for input(s): AST, ALT, ALKPHOS, BILITOT, ALBUMIN in the last 168 hours. Cardiac Enzymes  Recent Labs Lab 08/11/15 0926  TROPONINI 0.03   Glucose  Recent Labs Lab 08/11/15 1350 08/12/15 2210 08/13/15 0807 08/13/15 1703  GLUCAP 147* 165* 117* 151*     Imaging Dg Chest Port 1 View  08/14/2015  CLINICAL DATA:  Respiratory failure EXAM: PORTABLE CHEST 1 VIEW COMPARISON:  08/11/2015 FINDINGS: The heart size and mediastinal contours are within normal limits. Both lungs are clear. The visualized skeletal structures are unremarkable. IMPRESSION: No active disease. Electronically Signed   By: Alcide Clever M.D.   On: 08/14/2015 07:30    LINES:   CULTURES: Influenza PCR-positive for influenza A  ANTIBIOTICS Azithromycin 3/27>>3/30  ASSESSMENT / PLAN: 70 year old female with past medical history ofCOPD, chronic respiratory failure, now with influenza A, seen in consultation for respiratory failure  Acute on chronic respiratory failure COPD COPD exacerbation Influenza A viral pneumonia CO2 narcosis/ hypercapnia  Plan: -Continue with BiPAP, will plan for BiPAP 2 hours in the morning, 2 hours in the afternoon, and 6 hours at night. We'll continue this for one more day and then re-eval. I suspected influenza A. Is causing worsening respiratory failure and CO2 narcosis, in this case BiPAP will be very beneficial. -DuoNeb every 4 hours scheduled -Azithromycin -Maintain O2 saturations 88-94% -Stop Mucinex -Pulmicort nebulizers -Continue it Solu-Medrol 40 mg IV twice a day, consider transitioning to by mouth tapering steroid dose in the next day -Continue with Tamiflu course -Avoid sedatives, neurodepressive drugs given her somnolence and CO2 narcosis -chest x-ray in the morning -hold inhalers while in the ICU  Thank you for consulting [facility], Please feel free to contacts [place] with any questions at [phone] (please enter 7-digits).  I have personally obtained a history, examined the patient, evaluated laboratory and imaging results, formulated the assessment and plan and placed orders.  Pulmonary Care Time devoted to patient care services described in this note is 35 minutes.      Stephanie Acre,  MD  Pulmonary and Critical Care Pager [phone] (please enter 7-digits) On Call Pager - [phone] (please enter 7-digits)  Note: This note was prepared with Dragon dictation along with smaller phrase technology. Any transcriptional errors that result from this process are unintentional.

## 2015-08-14 NOTE — Progress Notes (Signed)
Pt does not want to be woke up for breathing treatments and stated that she would call if she wanted the bipap. RN aware.

## 2015-08-15 DIAGNOSIS — F411 Generalized anxiety disorder: Secondary | ICD-10-CM | POA: Diagnosis not present

## 2015-08-15 DIAGNOSIS — J101 Influenza due to other identified influenza virus with other respiratory manifestations: Secondary | ICD-10-CM | POA: Diagnosis not present

## 2015-08-15 DIAGNOSIS — J9600 Acute respiratory failure, unspecified whether with hypoxia or hypercapnia: Secondary | ICD-10-CM | POA: Diagnosis not present

## 2015-08-15 DIAGNOSIS — J111 Influenza due to unidentified influenza virus with other respiratory manifestations: Secondary | ICD-10-CM | POA: Diagnosis not present

## 2015-08-15 DIAGNOSIS — J441 Chronic obstructive pulmonary disease with (acute) exacerbation: Secondary | ICD-10-CM | POA: Diagnosis not present

## 2015-08-15 DIAGNOSIS — J9601 Acute respiratory failure with hypoxia: Secondary | ICD-10-CM | POA: Diagnosis not present

## 2015-08-15 MED ORDER — ALBUTEROL SULFATE HFA 108 (90 BASE) MCG/ACT IN AERS
1.0000 | INHALATION_SPRAY | RESPIRATORY_TRACT | Status: DC | PRN
  Filled 2015-08-15: qty 6.7

## 2015-08-15 MED ORDER — PREDNISONE 10 MG PO TABS: 10.0000 mg | ORAL_TABLET | Freq: Every day | ORAL | Status: DC

## 2015-08-15 MED ORDER — PREDNISONE 10 MG PO TABS
30.0000 mg | ORAL_TABLET | Freq: Every day | ORAL | Status: DC
Start: 2015-08-18 — End: 2015-08-16

## 2015-08-15 MED ORDER — PREDNISONE 20 MG PO TABS
40.0000 mg | ORAL_TABLET | Freq: Every day | ORAL | Status: DC
  Administered 2015-08-16: 40 mg via ORAL
  Filled 2015-08-15: qty 2

## 2015-08-15 MED ORDER — ALBUTEROL SULFATE HFA 108 (90 BASE) MCG/ACT IN AERS
2.0000 | INHALATION_SPRAY | RESPIRATORY_TRACT | Status: DC | PRN
  Filled 2015-08-15: qty 8.5

## 2015-08-15 MED ORDER — PREDNISONE 20 MG PO TABS: 20.0000 mg | ORAL_TABLET | Freq: Every day | ORAL | Status: DC

## 2015-08-15 NOTE — Progress Notes (Signed)
[facility] Physicians - [place] at [facility]   PATIENT NAME: Sarah Mckee    MR#:  [id number]  DATE OF BIRTH:  1946/04/13  SUBJECTIVE:  Surprisingly well-appearing Complains of depression but no medical complaints today  REVIEW OF SYSTEMS:  CONSTITUTIONAL: No fever, fatigue or weakness.  EYES: No blurred or double vision.  EARS, NOSE, AND THROAT: No tinnitus or ear pain.  RESPIRATORY: positive Improving cough, shortness of breath, wheezing denies hemoptysis.  CARDIOVASCULAR: No chest pain, orthopnea, edema.  GASTROINTESTINAL: No nausea, vomiting, diarrhea or abdominal pain.  GENITOURINARY: No dysuria, hematuria.  ENDOCRINE: No polyuria, nocturia,  HEMATOLOGY: No anemia, easy bruising or bleeding SKIN: No rash or lesion. MUSCULOSKELETAL: No joint pain or arthritis.   NEUROLOGIC: No tingling, numbness, weakness.  PSYCHIATRY: Positive anxiety  DRUG ALLERGIES:   Allergies  Allergen Reactions  . Codeine Shortness Of Breath  . Demerol [Meperidine] Shortness Of Breath  . Morphine And Related Shortness Of Breath  . Oxycodone Shortness Of Breath  . Alendronate     Other reaction(s): Other (See Comments) esophagitis  . Amoxicillin-Pot Clavulanate     Other reaction(s): Unknown  . Risedronate     Other reaction(s): Other (See Comments) epigastric pain    VITALS:  Blood pressure 132/79, pulse 98, temperature 97.8 F (36.6 C), temperature source Oral, resp. rate 30, height  (1.575 m), weight 76.4 kg (168 lb 6.9 oz), SpO2 94 %.  PHYSICAL EXAMINATION:   VITAL SIGNS: Filed Vitals:   08/15/15 0800 08/15/15 1200  BP: 114/59 132/79  Pulse: 82 98  Temp:    Resp: 17 30   GENERAL:69 y.o.female currently in no acute distress.  HEAD: Normocephalic, atraumatic.  EYES: Pupils equal, round, reactive to light. Extraocular muscles intact. No scleral icterus.  MOUTH: Moist mucosal membrane. Dentition intact. No abscess noted.  EAR, NOSE, THROAT: Clear without  exudates. No external lesions.  NECK: Supple. No thyromegaly. No nodules. No JVD.  PULMONARY: Diminished breath sounds without wheeze rails or rhonci. No use of accessory muscles, Good respiratory effort. good air entry bilaterally CHEST: Nontender to palpation.  CARDIOVASCULAR: S1 and S2. Regular rate and rhythm. No murmurs, rubs, or gallops. No edema. Pedal pulses 2+ bilaterally.  GASTROINTESTINAL: Soft, nontender, nondistended. No masses. Positive bowel sounds. No hepatosplenomegaly.  MUSCULOSKELETAL: No swelling, clubbing, or edema. Range of motion full in all extremities.  NEUROLOGIC: Cranial nerves II through XII are intact. No gross focal neurological deficits. Sensation intact. Reflexes intact.  SKIN: No ulceration, lesions, rashes, or cyanosis. Skin warm and dry. Turgor intact.  PSYCHIATRIC: Mood, affect anxious The patient is awake, alert and oriented x 3. Insight, judgment intact.       LABORATORY PANEL:   CBC  Recent Labs Lab 08/13/15 1343  WBC 4.9  HGB 13.1  HCT 39.2  PLT 169   ------------------------------------------------------------------------------------------------------------------  Chemistries   Recent Labs Lab 08/11/15 0926 08/12/15 0644  NA 138 140  K 2.7* 4.1  CL 96* 98*  CO2 37* 34*  GLUCOSE 120* 137*  BUN 14 18  CREATININE 0.68 0.51  CALCIUM 8.6* 9.1  MG 2.1  --    ------------------------------------------------------------------------------------------------------------------  Cardiac Enzymes  Recent Labs Lab 08/11/15 0926  TROPONINI 0.03   ------------------------------------------------------------------------------------------------------------------  RADIOLOGY:  Dg Chest Port 1 View  08/14/2015  CLINICAL DATA:  Respiratory failure EXAM: PORTABLE CHEST 1 VIEW COMPARISON:  08/11/2015 FINDINGS: The heart size and mediastinal contours are within normal limits. Both lungs are clear. The visualized skeletal structures are  unremarkable. IMPRESSION:  No active disease. Electronically Signed   By: Alcide CleverMark  Lukens M.D.   On: 08/14/2015 07:30    EKG:   Orders placed or performed during the hospital encounter of 08/11/15  . EKG 12-Lead  . EKG 12-Lead  . ED EKG  . ED EKG    ASSESSMENT AND PLAN:   70 year old caucasian female admitted 08/11/15 with copd exacerbation  1. Acute respiratory failure with hypoxia Chronic obstructive pulmonary disease exacerbation: secondary to influenza -- tamiflu,Provide DuoNeb treatments q. 4 hours, steroid taper  continued home medications -stable for transfer out of the unit  2.venous thromboembolism prophylactic : lovenox     All the records are reviewed and case discussed with Care Management/Social Workerr. Management plans discussed with the patient, family and they are in agreement.  CODE STATUS: full  TOTAL TIME TAKING CARE OF THIS PATIENT:28 minutes POSSIBLE D/C IN 1-2 DAYS, DEPENDING ON CLINICAL CONDITION.   Hower,  Mardi MainlandDavid K M.D on 08/15/2015 at 2:12 PM  Between 7am to 6pm - Pager - [phone]  After 6pm: House Pager: - [phone]  [facility]  Office  [phone]  CC: Primary care physician; Patrice ParadiseMCLAUGHLIN, MIRIAM K, MD

## 2015-08-15 NOTE — Progress Notes (Signed)
Pt  doesn't wish to wear Bipap QHS Pt doesn't wish to be woke for breathing treatments

## 2015-08-15 NOTE — Progress Notes (Signed)
Pt oob to chair after bath, no complaints

## 2015-08-15 NOTE — Consult Note (Signed)
   [facility]   08/15/2015  Sarah Mckee 11/29/1945 [id number]   [facility] EPIC [facility] received. Chart reviewed. Noted patient in ICU. Will follow up when more appropriate.    Raiford NobleAtika Hall, MSN-Ed, RN,BSN [facility] Hospital Liaison [phone]

## 2015-08-15 NOTE — Progress Notes (Signed)
Per Dr. Clint GuyHower okay for pt to have inhalers at bedside

## 2015-08-15 NOTE — Progress Notes (Addendum)
PULMONARY / CRITICAL CARE MEDICINE   Name: Sarah Mckee MRN: [id number] DOB: 1945-08-26    ADMISSION DATE:  08/11/2015  BRIEF HISTORY: 70 y.o. female with a known history of chronic respiratory failure on 2 L of oxygen for COPD, history of pulmonary hypertension I's and osteoarthritis who states that she's been having progressive shortness of breath for the past 2 weeks. Found to have Influ A. Transferred out the ICU on 3/26, transferred back to ICU on 3/27 am for somnolence and respiratory failure requiring bipap.   SUBJECTIVE:  Patient doing well this morning, more alert and awake compared to yesterday, mild wheezes.  Tolerated BiPAP well overnight   STUDIES:  Echocardiogram 08/12/2015- EF 50-55%, normal systolic function. SIGNIFICANT EVENTS: 08/11/2015-admitted for shortness of breath, requiring BiPAP, try to the ICU. Transferred out of the ICU on 08/12/2015 08/13/2015- transfer back to the ICU for increased somnolence, increase breathing,desaturations, and need for BiPAP therapy.  VITAL SIGNS: Temp:  [97.2 F (36.2 C)-98.2 F (36.8 C)] 97.8 F (36.6 C) (03/29 0731) Pulse Rate:  [72-102] 88 (03/29 0731) Resp:  [13-23] 17 (03/29 0731) BP: (109-137)/(63-71) 110/67 mmHg (03/29 0731) SpO2:  [97 %-100 %] 97 % (03/29 0752) HEMODYNAMICS:   VENTILATOR SETTINGS:   INTAKE / OUTPUT:  Intake/Output Summary (Last 24 hours) at 08/15/15 0930 Last data filed at 08/15/15 0900  Gross per 24 hour  Intake    740 ml  Output   1250 ml  Net   -510 ml    Review of Systems  Constitutional: Negative for fever, chills, weight loss and malaise/fatigue.  Eyes: Negative for blurred vision.  Respiratory: Positive for cough.        Improving cough, sob, no wheezing today.   Cardiovascular: Negative for chest pain.  Gastrointestinal: Negative for heartburn, nausea and vomiting.  Genitourinary: Negative for dysuria.  Musculoskeletal: Negative for myalgias.  Skin: Negative for rash.   Neurological: Negative for dizziness and headaches.  Endo/Heme/Allergies: Does not bruise/bleed easily.    Physical Exam  Constitutional: She is oriented to person, place, and time and well-developed, well-nourished, and in no distress.  HENT:  Head: Normocephalic and atraumatic.  Right Ear: External ear normal.  Left Ear: External ear normal.  Nose: Nose normal.  Mouth/Throat: Oropharynx is clear and moist.  Eyes: Pupils are equal, round, and reactive to light.  Neck: Normal range of motion. Neck supple.  Cardiovascular: Normal rate, regular rhythm, normal heart sounds and intact distal pulses.   Pulmonary/Chest: She has no rales. She exhibits no tenderness.  Mild exp wheezes  Abdominal: Soft. Bowel sounds are normal. She exhibits no distension. There is no tenderness.  Musculoskeletal: Normal range of motion. She exhibits no edema.  Neurological: She is alert and oriented to person, place, and time.  Skin: Skin is warm and dry.  Nursing note and vitals reviewed.    LABS:  CBC  Recent Labs Lab 08/11/15 0926 08/12/15 0644 08/13/15 1343  WBC 7.7 1.9* 4.9  HGB 13.1 13.2 13.1  HCT 39.8 39.1 39.2  PLT 178 176 169   Coag's No results for input(s): APTT, INR in the last 168 hours. BMET  Recent Labs Lab 08/11/15 0926 08/12/15 0644  NA 138 140  K 2.7* 4.1  CL 96* 98*  CO2 37* 34*  BUN 14 18  CREATININE 0.68 0.51  GLUCOSE 120* 137*   Electrolytes  Recent Labs Lab 08/11/15 0926 08/12/15 0644  CALCIUM 8.6* 9.1  MG 2.1  --    Sepsis Markers  Recent  Labs Lab 08/11/15 0926 08/11/15 1305  LATICACIDVEN 1.8 2.0   ABG  Recent Labs Lab 08/13/15 1342  PHART 7.50*  PCO2ART 57*  PO2ART 179*   Liver Enzymes No results for input(s): AST, ALT, ALKPHOS, BILITOT, ALBUMIN in the last 168 hours. Cardiac Enzymes  Recent Labs Lab 08/11/15 0926  TROPONINI 0.03   Glucose  Recent Labs Lab 08/11/15 1350 08/12/15 2210 08/13/15 0807 08/13/15 1703  GLUCAP  147* 165* 117* 151*    Imaging No results found.  LINES:  CULTURES: Influenza PCR-positive for influenza A  ANTIBIOTICS Azithromycin 3/27>>3/30  ASSESSMENT / PLAN: 70 year old female with past medical history of COPD, chronic respiratory failure, now with influenza A, seen in consultation for respiratory failure  Acute on chronic respiratory failure COPD COPD exacerbation Influenza A viral pneumonia CO2 narcosis/ hypercapnia  Plan: -Continue with BiPAP at night only for inpatient stay given influ A and COPD exacerbation. I suspected influenza A. Is causing worsening respiratory failure and CO2 narcosis, in this case BiPAP will be very beneficial at night, inpt only.  -DuoNeb every 4 hours prn -Azithromycin x 5 days total -Maintain O2 saturations 88-94% -Stop Mucinex -cont with Pulmicort nebulizers, can d/c and transition back to inhaler once out of the ICU -Prednisone 40mg  PO taper over 10 days.  -CompleteTamiflu course -Avoid sedatives, neurodepressive drugs given her somnolence and CO2 narcosis -hold inhalers while in the ICU, can resume when transferred out to the med/surg unit -stable to transfer out of the ICU today. Dr. Meredeth IdeFleming will see in consultation on the med/surg unit.  Thank you for consulting [facility], we will signoff at this time.  Please feel free to contact us with any questions at [phone] (please enter 7-digits).   I have personally obtained a history, examined the patient, evaluated laboratory and imaging results, formulated the assessment and plan and placed orders.  Pulmonary Care Time devoted to patient care services described in this note is 35 minutes.    Sarah AcreVishal Lakendria Nicastro, MD [facility] Pager [phone]- [phone] (please enter 7-digits) On Call Pager - [phone] (please enter 7-digits)  Note: This note was prepared with Dragon dictation along with smaller phrase technology. Any transcriptional  errors that result from this process are unintentional.

## 2015-08-16 DIAGNOSIS — J9601 Acute respiratory failure with hypoxia: Secondary | ICD-10-CM | POA: Diagnosis not present

## 2015-08-16 DIAGNOSIS — J111 Influenza due to unidentified influenza virus with other respiratory manifestations: Secondary | ICD-10-CM | POA: Diagnosis not present

## 2015-08-16 DIAGNOSIS — J441 Chronic obstructive pulmonary disease with (acute) exacerbation: Secondary | ICD-10-CM | POA: Diagnosis not present

## 2015-08-16 DIAGNOSIS — J9600 Acute respiratory failure, unspecified whether with hypoxia or hypercapnia: Secondary | ICD-10-CM | POA: Diagnosis not present

## 2015-08-16 LAB — CULTURE, BLOOD (ROUTINE X 2)
Culture: NO GROWTH
Culture: NO GROWTH

## 2015-08-16 MED ORDER — PREDNISONE 10 MG (21) PO TBPK: 10.0000 mg | ORAL_TABLET | Freq: Every day | ORAL | Status: DC

## 2015-08-16 MED ORDER — OSELTAMIVIR PHOSPHATE 75 MG PO CAPS: 75.0000 mg | ORAL_CAPSULE | Freq: Every day | ORAL | Status: DC

## 2015-08-16 NOTE — Discharge Summary (Signed)
[facility] - [place] at [facility]   PATIENT NAME: Sarah Mckee    MR#:  [id number]  DATE OF BIRTH:  Apr 08, 1946  DATE OF ADMISSION:  08/11/2015 ADMITTING PHYSICIAN: [name], MD  DATE OF DISCHARGE: No discharge date for patient encounter.  PRIMARY CARE PHYSICIAN: MCLAUGHLIN, MIRIAM K, MD    ADMISSION DIAGNOSIS:  Hypercarbia [R06.89] Respiratory distress [R06.00] Influenza [J11.1]  DISCHARGE DIAGNOSIS:  Acute on chronic respiratory failure with hypercapnia and hypoxia Influenza a, not H1 and 1  SECONDARY DIAGNOSIS:   Past Medical History  Diagnosis Date  . COPD (chronic obstructive pulmonary disease) (HCC)     2L 02 at baseline  . Hypertension   . Arthritis   . Pulmonary HTN [facility])     HOSPITAL COURSE:  Sarah Mckee  is a 70 y.o. female admitted 08/11/2015 with chief complaint of shortness of breath. Please see H&P performed by Dr. Allena Katz for further information. Upon presentation to the hospital she originally required BiPAP therapy to maintain oxygen saturations and relief respiratory distress. She is found to be influenza A positive started on Tamiflu. She had slow but significant improvement of symptoms now back to baseline oxygen of 2 L nasal cannula. She is completed course of Tamiflu in the hospital I will prescribe her prophylactic dosing as there is concern her husband is still infected with influenza  DISCHARGE CONDITIONS:   Stable/improved  CONSULTS OBTAINED:  Treatment Team:  Mertie Moores, MD  DRUG ALLERGIES:   Allergies  Allergen Reactions  . Codeine Shortness Of Breath  . Demerol [Meperidine] Shortness Of Breath  . Morphine And Related Shortness Of Breath  . Oxycodone Shortness Of Breath  . Alendronate     Other reaction(s): Other (See Comments) esophagitis  . Amoxicillin-Pot Clavulanate     Other reaction(s): Unknown  . Risedronate     Other reaction(s): Other (See Comments) epigastric pain    DISCHARGE  MEDICATIONS:   Current Discharge Medication List    START taking these medications   Details  oseltamivir (TAMIFLU) 75 MG capsule Take 1 capsule (75 mg total) by mouth daily. Qty: 5 capsule, Refills: 0    predniSONE (STERAPRED UNI-PAK 21 TAB) 10 MG (21) TBPK tablet Take 1 tablet (10 mg total) by mouth daily.  oral dailyx1day,  oral x2 day,  oral x2 day then stop Qty: 10 tablet, Refills: 0      CONTINUE these medications which have NOT CHANGED   Details  acetaminophen (TYLENOL) 500 MG tablet Take 1,000 mg by mouth at bedtime.    albuterol (PROVENTIL) (2.5 MG/3ML) 0.083% nebulizer solution Take 3 mLs by nebulization every 6 (six) hours as needed. For wheezing.    ALPRAZolam (XANAX) 0.25 MG tablet Take 0.25 mg by mouth 2 (two) times daily as needed. For anxiety.    montelukast (SINGULAIR) 10 MG tablet Take 10 mg by mouth daily.    PROAIR HFA 108 (90 Base) MCG/ACT inhaler Inhale 2 puffs into the lungs See admin instructions. Inhale 2 puffs every 4 to 6 hours as needed for shortness of breath/wheezing.    SPIRIVA RESPIMAT 2.5 MCG/ACT AERS Inhale 2 puffs into the lungs daily.    traMADol (ULTRAM) 50 MG tablet Take 50-100 mg by mouth every 6 (six) hours as needed. For pain.    triamterene-hydrochlorothiazide (MAXZIDE-25) 37.5-25 MG tablet Take 1 tablet by mouth daily.      STOP taking these medications     azithromycin (ZITHROMAX) 250 MG tablet  potassium chloride SA (KLOR-CON M20) 20 MEQ tablet      HYDROmorphone (DILAUDID) 2 MG tablet          DISCHARGE INSTRUCTIONS:    DIET:  Regular diet  DISCHARGE CONDITION:  Stable  ACTIVITY:  Activity as tolerated  OXYGEN:  Home Oxygen: Yes.     Oxygen Delivery: 2 liters/min via Patient connected to nasal cannula oxygen  DISCHARGE LOCATION:  home   If you experience worsening of your admission symptoms, develop shortness of breath, life threatening emergency, suicidal or homicidal thoughts you must seek  medical attention immediately by calling 911 or calling your MD immediately  if symptoms less severe.  You Must read complete instructions/literature along with all the possible adverse reactions/side effects for all the Medicines you take and that have been prescribed to you. Take any new Medicines after you have completely understood and accpet all the possible adverse reactions/side effects.   Please note  You were cared for by a hospitalist during your hospital stay. If you have any questions about your discharge medications or the care you received while you were in the hospital after you are discharged, you can call the unit and asked to speak with the hospitalist on call if the hospitalist that took care of you is not available. Once you are discharged, your primary care physician will handle any further medical issues. Please note that NO REFILLS for any discharge medications will be authorized once you are discharged, as it is imperative that you return to your primary care physician (or establish a relationship with a primary care physician if you do not have one) for your aftercare needs so that they can reassess your need for medications and monitor your lab values.    On the day of Discharge:   VITAL SIGNS:  Blood pressure 99/73, pulse 83, temperature 97.9 F (36.6 C), temperature source Oral, resp. rate 19, height  (1.575 m), weight 76.4 kg (168 lb 6.9 oz), SpO2 98 %.  I/O:   Intake/Output Summary (Last 24 hours) at 08/16/15 1114 Last data filed at 08/16/15 0556  Gross per 24 hour  Intake    240 ml  Output    800 ml  Net   -560 ml    PHYSICAL EXAMINATION:  GENERAL:  70 y.o.-year-old patient lying in the bed with no acute distress.  EYES: Pupils equal, round, reactive to light and accommodation. No scleral icterus. Extraocular muscles intact.  HEENT: Head atraumatic, normocephalic. Oropharynx and nasopharynx clear.  NECK:  Supple, no jugular venous distention. No  thyroid enlargement, no tenderness.  LUNGS: Normal breath sounds bilaterally, no wheezing, rales,rhonchi or crepitation. No use of accessory muscles of respiration.  CARDIOVASCULAR: S1, S2 normal. No murmurs, rubs, or gallops.  ABDOMEN: Soft, non-tender, non-distended. Bowel sounds present. No organomegaly or mass.  EXTREMITIES: No pedal edema, cyanosis, or clubbing.  NEUROLOGIC: Cranial nerves II through XII are intact. Muscle strength 5/5 in all extremities. Sensation intact. Gait not checked.  PSYCHIATRIC: The patient is alert and oriented x 3.  SKIN: No obvious rash, lesion, or ulcer.   DATA REVIEW:   CBC  Recent Labs Lab 08/13/15 1343  WBC 4.9  HGB 13.1  HCT 39.2  PLT 169    Chemistries   Recent Labs Lab 08/11/15 0926 08/12/15 0644  NA 138 140  K 2.7* 4.1  CL 96* 98*  CO2 37* 34*  GLUCOSE 120* 137*  BUN 14 18  CREATININE 0.68 0.51  CALCIUM 8.6* 9.1  MG 2.1  --     Cardiac Enzymes  Recent Labs Lab 08/11/15 0926  TROPONINI 0.03    Microbiology Results  Results for orders placed or performed during the hospital encounter of 08/11/15  Rapid Influenza A&B Antigens ([facility] only)     Status: Abnormal   Collection Time: 08/11/15  9:26 AM  Result Value Ref Range Status   Influenza A ([facility]) POSITIVE (A) NEGATIVE Final   Influenza B ([facility]) NEGATIVE NEGATIVE Final  Blood culture (routine x 2)     Status: None (Preliminary result)   Collection Time: 08/11/15  9:29 AM  Result Value Ref Range Status   Specimen Description BLOOD LEFT WRIST  Final   Special Requests BOTTLES DRAWN AEROBIC AND ANAEROBIC  3CC  Final   Culture NO GROWTH 4 DAYS  Final   Report Status PENDING  Incomplete  Blood culture (routine x 2)     Status: None (Preliminary result)   Collection Time: 08/11/15 11:10 AM  Result Value Ref Range Status   Specimen Description BLOOD LEFT HAND  Final   Special Requests BOTTLES DRAWN AEROBIC AND ANAEROBIC  1CC  Final   Culture NO GROWTH 4 DAYS  Final    Report Status PENDING  Incomplete  MRSA PCR Screening     Status: None   Collection Time: 08/11/15  1:50 PM  Result Value Ref Range Status   MRSA by PCR NEGATIVE NEGATIVE Final    Comment:        The GeneXpert MRSA Assay (FDA approved for NASAL specimens only), is one component of a comprehensive MRSA colonization surveillance program. It is not intended to diagnose MRSA infection nor to guide or monitor treatment for MRSA infections.     RADIOLOGY:  No results found.   Management plans discussed with the patient, family and they are in agreement.  CODE STATUS:     Code Status Orders        Start     Ordered   08/11/15 1130  Full code   Continuous     08/11/15 1130    Code Status History    Date Active Date Inactive Code Status Order ID Comments User Context   This patient has a current code status but no historical code status.      TOTAL TIME TAKING CARE OF THIS PATIENT: 28 minutes.    Briseidy Spark,  Mardi MainlandDavid K M.D on 08/16/2015 at 11:14 AM  Between 7am to 6pm - Pager - [phone]  After 6pm go to [email or web address] - password [email or web address] [facility]  [facility]  Office  [phone]  CC: Primary care physician; Patrice ParadiseMCLAUGHLIN, MIRIAM K, MD

## 2015-08-16 NOTE — Consult Note (Signed)
   [facility]   08/16/2015  [name] 1946-04-19 [id number]    Telephone call made to patient to discuss [facility] services. However, she has been discharged. Attempt made to contact patient. Left generic HIPPA compliant voicemail to request call back.    Raiford NobleAtika Hall, MSN-Ed, RN,BSN [facility] Care Management Hospital Liaison [phone]

## 2015-08-16 NOTE — Progress Notes (Signed)
Pt stable. IVs removed. D/c instructions given and education provided. Signed prescriptions verified and given. Pt states she understands instructions. Pt dressed and escorted out by staff. Driven home by family.

## 2015-08-22 DIAGNOSIS — J101 Influenza due to other identified influenza virus with other respiratory manifestations: Secondary | ICD-10-CM | POA: Diagnosis not present

## 2015-08-22 DIAGNOSIS — J449 Chronic obstructive pulmonary disease, unspecified: Secondary | ICD-10-CM | POA: Diagnosis not present

## 2015-08-22 DIAGNOSIS — J9622 Acute and chronic respiratory failure with hypercapnia: Secondary | ICD-10-CM | POA: Diagnosis not present

## 2015-10-03 ENCOUNTER — Ambulatory Visit (INDEPENDENT_AMBULATORY_CARE_PROVIDER_SITE_OTHER): Payer: PPO | Admitting: Internal Medicine

## 2015-10-03 ENCOUNTER — Encounter: Payer: Self-pay | Admitting: Internal Medicine

## 2015-10-03 VITALS — BP 134/76 | HR 85 | Ht 62.0 in | Wt 164.0 lb

## 2015-10-03 DIAGNOSIS — R0600 Dyspnea, unspecified: Secondary | ICD-10-CM | POA: Diagnosis not present

## 2015-10-03 DIAGNOSIS — J449 Chronic obstructive pulmonary disease, unspecified: Secondary | ICD-10-CM | POA: Insufficient documentation

## 2015-10-03 DIAGNOSIS — J9611 Chronic respiratory failure with hypoxia: Secondary | ICD-10-CM

## 2015-10-03 DIAGNOSIS — R0902 Hypoxemia: Secondary | ICD-10-CM

## 2015-10-03 DIAGNOSIS — J9610 Chronic respiratory failure, unspecified whether with hypoxia or hypercapnia: Secondary | ICD-10-CM | POA: Insufficient documentation

## 2015-10-03 NOTE — Progress Notes (Signed)
[facility] Pulmonary Medicine Consultation    Date: 10/03/2015  MRN# [id number] Sarah Mckee 1946-03-07  Referring Physician: Dr. Gala Lewandowsky Sarah Mckee is a 70 y.o. old female seen in consultation for transition of care of COPD.   CC:  Chief Complaint  Patient presents with  . pulmonary consult    [name]    HPI:  Patient is a pleasant 70 year old female seen in consultation for transition of care from Dr. Meredeth Ide office for COPD, referred by primary care physician. Patient is a known history of severe COPD on 2 L of oxygen chronically, along with Spiriva and as needed albuterol. She is seeing Dr. Mayo Ao for 8 years now since 2009, earlier on this year she had a COPD exacerbation attributed to influenza A and was hospitalized for about 3 days requiring BiPAP and a short observation stay in the ICU. After discharge she was given a prednisone taper and Tamiflu, but within one to 2 weeks she developed again shortness of breath and cough, and was seen by her primary care physician and was given prednisone taper along with doxycycline. Since then she's been doing well, back to baseline breathing, using 2 L of oxygen continuously at night at rest and with exertion. Last pulmonary function test was in 2016, July, at this time I'm unable to access the records. Overall she states that she's been doing well and is back to baseline breathing now, since discharge from hospitalization. She states that her COPD triggers are rapid environmental changes and seasonal changes, especially fall and early spring. She does not smoke. She has a cane that she uses secondary to osteoarthritis of the hips and a history of pelvic fractures. She has tried 6 minute walk test in the past but due to the hip discomfort she could not complete them. She has never been intubated for breathing issues. Patient states that she has chronic cholecystitis manage with dietary modifications, avoiding fatty  foods. She was told by Dr. Meredeth Ide that if she needed to go under general anesthesia given the level of severe COPD, she would require intubation and possibly a prolonged intubation. She was seen by general surgery, for evaluation of gallstones, again advised on dietary control. Patient stated today that if there was a time that she needed emergent cholecystectomy, she does not one a be on a ventilator for prolonged period of time. I have advised her to discuss with her healthcare power of attorney her plans for goals of care in that event. She will make her daughter healthcare power of attorney.   [facility] Hospitalization (3/25-3/30/17), reviewed by Dr. Lamonte Sakai Carby is a 70 y.o. female admitted 08/11/2015 with chief complaint of shortness of breath. Please see H&P performed by Dr. Allena Katz for further information. Upon presentation to the hospital she originally required BiPAP therapy to maintain oxygen saturations and relief respiratory distress. She is found to be influenza A positive started on Tamiflu. She had slow but significant improvement of symptoms now back to baseline oxygen of 2 L nasal cannula. She is completed course of Tamiflu in the hospital I will prescribe her prophylactic dosing as there is concern her husband is still infected with influenza   Dr. Meredeth Ide Office visit 03/2015 reviewed by Dr. Dema Severin History of Present Illness: Lyric Sarah Mckee is a 70 y.o. female presents to clinic for recheck. No recent hospitalization for pulmonary symptoms. Today no wheezing, no cough, chest pain, calf pain, hemoptysis, rhinitis, gerd, ectopy or syncope. See impression.   Current  Medications:  Current Outpatient Prescriptions  Medication Sig Dispense Refill  . acetaminophen (TYLENOL) 650 MG ER tablet Take 650 mg by mouth every 8 (eight) hours as needed for Pain.  Marland Kitchen albuterol (PROAIR HFA) 90 mcg/actuation inhaler Inhale 2 inhalations into the lungs as directed for Wheezing. [INHALE TWO PUFFS BY  MOUTH EVERY 4 TO 6 HOURS AS NEEDED] 3 Inhaler 3  . albuterol (PROVENTIL) 2.5 mg /3 mL (0.083 %) nebulizer solution Take 3 mLs (2.5 mg total) by nebulization every 6 (six) hours as needed for Wheezing. 120 ampule 11  . ALPRAZolam (XANAX) 0.25 MG tablet Take 1 tablet (0.25 mg total) by mouth 2 (two) times daily as needed. 60 tablet 0  . FUROsemide (LASIX) 40 MG tablet Take 1 tablet (40 mg total) by mouth once daily as needed. 30 tablet 5  . montelukast (SINGULAIR) 10 mg tablet Take 1 tablet (10 mg total) by mouth once daily. 30 tablet 5  . potassium chloride (KLOR-CON M20) 20 MEQ ER tablet Take 1 tablet (20 mEq total) by mouth 2 (two) times daily. 180 tablet 1  . tiotropium-olodaterol (STIOLTO RESPIMAT) 2.5-2.5 mcg/actuation Mist Inhale into the lungs.  . traMADol (ULTRAM) 50 mg tablet Take 1 tablet (50 mg total) by mouth every 6 (six) hours as needed for Pain. 60 tablet 2  . triamterene-hydrochlorothiazide (MAXZIDE-25) 37.5-25 mg tablet TAKE ONE TABLET BY MOUTH ONCE DAILY 30 tablet 5  . tiotropium bromide (SPIRIVA RESPIMAT) 2.5 mcg/actuation Mist Inhale 2 inhalations into the lungs once daily. (Patient not taking: Reported on 03/30/2015 ) 24 g 3   No current facility-administered medications for this visit.   Problem List:  Patient Active Problem List  Diagnosis  . COPD, severe  . Dependent edema  . Hypoxia  . Hypertension  . Pulmonary hypertension  . Osteoporosis  . Obesity  . Cholelithiasis   Impression: Stage IV COPD stable Hypoxia on oxygen at 2 liters. Obesity. Loosing  Relatively stable right ventricular systolic pressure by echo.  Dyspnea on exertion secondary to above. Lower extremity lymphedema. Chronic.  Gallstone, presently in no distress, base on her lung function would hold off on elective cholecystectomy.    Plan: -continue same copd regimen (In dough-nut)  -watch weight -following serial echo's -pace self -portable oxygen at 2 liters -support hoses,  elevate -follow up in 4 months    PMHX:   Past Medical History  Diagnosis Date  . COPD (chronic obstructive pulmonary disease) (HCC)     2L 02 at baseline  . Hypertension   . Arthritis   . Pulmonary HTN (HCC)    Surgical Hx:  Past Surgical History  Procedure Laterality Date  . None     Family Hx:  Family History  Problem Relation Age of Onset  . Heart attack Mother   . Cancer Father   . Thyroid disease Father   . Diabetes Sister   . Diabetes Brother    Social Hx:   Social History  Substance Use Topics  . Smoking status: Former Smoker    Quit date: 05/20/2007  . Smokeless tobacco: Never Used  . Alcohol Use: No   Medication:   Current Outpatient Rx  Name  Route  Sig  Dispense  Refill  . acetaminophen (TYLENOL) 500 MG tablet   Oral   Take 1,000 mg by mouth at bedtime.         Marland Kitchen albuterol (PROVENTIL) (2.5 MG/3ML) 0.083% nebulizer solution   Nebulization   Take 3 mLs by nebulization every 6 (six)  hours as needed. For wheezing.         Marland Kitchen ALPRAZolam (XANAX) 0.25 MG tablet   Oral   Take 0.25 mg by mouth 2 (two) times daily as needed. For anxiety.         . montelukast (SINGULAIR) 10 MG tablet   Oral   Take 10 mg by mouth daily.         Marland Kitchen oseltamivir (TAMIFLU) 75 MG capsule   Oral   Take 1 capsule (75 mg total) by mouth daily.   5 capsule   0   . predniSONE (STERAPRED UNI-PAK 21 TAB) 10 MG (21) TBPK tablet   Oral   Take 1 tablet (10 mg total) by mouth daily.  oral dailyx1day,  oral x2 day,  oral x2 day then stop   10 tablet   0   . PROAIR HFA 108 (90 Base) MCG/ACT inhaler   Inhalation   Inhale 2 puffs into the lungs See admin instructions. Inhale 2 puffs every 4 to 6 hours as needed for shortness of breath/wheezing.           Dispense as written.   Marland Kitchen SPIRIVA RESPIMAT 2.5 MCG/ACT AERS   Inhalation   Inhale 2 puffs into the lungs daily.           Dispense as written.   . traMADol (ULTRAM) 50 MG tablet   Oral   Take 50-100  mg by mouth every 6 (six) hours as needed. For pain.         Marland Kitchen triamterene-hydrochlorothiazide (MAXZIDE-25) 37.5-25 MG tablet   Oral   Take 1 tablet by mouth daily.             Allergies:  Codeine; Demerol; Morphine and related; Oxycodone; Alendronate; Amoxicillin-pot clavulanate; and Risedronate  Review of Systems  Constitutional: Negative for fever, chills, weight loss, malaise/fatigue and diaphoresis.  Eyes: Negative for blurred vision and double vision.  Respiratory: Positive for shortness of breath. Negative for cough, hemoptysis, sputum production and wheezing.   Cardiovascular: Negative for chest pain and palpitations.  Gastrointestinal: Negative for heartburn, nausea and vomiting.  Genitourinary: Negative for dysuria.  Musculoskeletal: Positive for back pain and joint pain. Negative for myalgias.       Chronic back and joint pains from OA  Skin: Negative for itching and rash.  Neurological: Negative for dizziness.  Endo/Heme/Allergies: Does not bruise/bleed easily.     Physical Examination:   VS: There were no vitals taken for this visit.  General Appearance: No distress  Neuro:without focal findings, mental status, speech normal, alert and oriented, cranial nerves 2-12 intact, reflexes normal and symmetric, sensation grossly normal  HEENT: PERRLA, EOM intact, no ptosis, no other lesions noticed; Mallampati 2 Pulmonary: normal breath sounds., diaphragmatic excursion normal.No wheezing, No rales;   Sputum Production:   CardiovascularNormal S1,S2.  No m/r/g.  Abdominal aorta pulsation normal.    Abdomen: Benign, Soft, non-tender, No masses, hepatosplenomegaly, No lymphadenopathy Renal:  No costovertebral tenderness  GU:  No performed at this time. Endoc: No evident thyromegaly, no signs of acromegaly or Cushing features Skin:   warm, no rashes, no ecchymosis  Extremities: normal, no cyanosis, clubbing, no edema, warm with normal capillary refill. Other findings:    Labs results:   Rad results:  CXR 07/2015 EXAM: PORTABLE CHEST 1 VIEW  COMPARISON: 08/11/2015  FINDINGS: The heart size and mediastinal contours are within normal limits. Both lungs are clear. The visualized skeletal structures are unremarkable.  IMPRESSION: No active disease.  ECHO 09/2014 NORMAL LEFT VENTRICULAR SYSTOLIC FUNCTION NORMAL RIGHT VENTRICULAR SYSTOLIC FUNCTION MILD VALVULAR REGURGITATION (See above) NO VALVULAR STENOSIS Study limited by poor sound transmission  ECHO 07/2015 Study Conclusions - Left ventricle: Systolic function was normal. The estimated  ejection fraction was in the range of 50% to 55%.    Assessment and Plan: 70 year old female seen in consultation for severe COPD, transition of care from Dr. Meredeth IdeFleming office Chronic respiratory failure (HCC) On 2 L of supplemental oxygen continuously.   Plan -continue with 2 L of supplemental oxygen continuously, may use 2 L pulse with exertion.  COPD with hypoxia [facility](HCC) Patient with known COPD, centrilobular emphysema. Classification is severe, stage IV. Previously managed by Dr. Meredeth IdeFleming. No significant exacerbations over the past 2 years, except for recent hospitalization for influenza A. Overall management with supplemental oxygen continuously, Spiriva, as needed albuterol.  Plan: -Pulmonary function testing prior to follow-up visit -Continue Spiriva and as needed rescue inhaler    Updated Medication List Outpatient Encounter Prescriptions as of 10/03/2015  Medication Sig  . acetaminophen (TYLENOL) 500 MG tablet Take 1,000 mg by mouth at bedtime.  Marland Kitchen. albuterol (PROVENTIL) (2.5 MG/3ML) 0.083% nebulizer solution Take 3 mLs by nebulization every 6 (six) hours as needed. For wheezing.  Marland Kitchen. ALPRAZolam (XANAX) 0.25 MG tablet Take 0.25 mg by mouth 2 (two) times daily as needed. For anxiety.  . montelukast (SINGULAIR) 10 MG tablet Take 10 mg by mouth daily.  Marland Kitchen. oseltamivir (TAMIFLU) 75 MG capsule Take  1 capsule (75 mg total) by mouth daily.  . predniSONE (STERAPRED UNI-PAK 21 TAB) 10 MG (21) TBPK tablet Take 1 tablet (10 mg total) by mouth daily. 40mg  oral dailyx1day, 20mg  oral x2 day, 10mg  oral x2 day then stop  . PROAIR HFA 108 (90 Base) MCG/ACT inhaler Inhale 2 puffs into the lungs See admin instructions. Inhale 2 puffs every 4 to 6 hours as needed for shortness of breath/wheezing.  Marland Kitchen. SPIRIVA RESPIMAT 2.5 MCG/ACT AERS Inhale 2 puffs into the lungs daily.  . traMADol (ULTRAM) 50 MG tablet Take 50-100 mg by mouth every 6 (six) hours as needed. For pain.  Marland Kitchen. triamterene-hydrochlorothiazide (MAXZIDE-25) 37.5-25 MG tablet Take 1 tablet by mouth daily.   No facility-administered encounter medications on file as of 10/03/2015.    Orders for this visit: No orders of the defined types were placed in this encounter.     Thank  you for the consultation and for allowing [facility] to assist in the care of your patient. Our recommendations are noted above.  Please contact us if we can be of further service.   Stephanie AcreVishal Chrislynn Mosely, MD [facility] Office Number: [phone]  Note: This note was prepared with Dragon dictation along with smaller phrase technology. Any transcriptional errors that result from this process are unintentional.

## 2015-10-03 NOTE — Patient Instructions (Signed)
Follow up with Dr. Dema SeverinMungal in 3 months  - pulmonary function testing prior to follow up - cont with spiriva respimat, gargle and rinse after each use - cont with low fat diet - update your healthcare power of attorney paperwork

## 2015-10-03 NOTE — Assessment & Plan Note (Signed)
On 2 L of supplemental oxygen continuously.   Plan -continue with 2 L of supplemental oxygen continuously, may use 2 L pulse with exertion.

## 2015-10-03 NOTE — Assessment & Plan Note (Signed)
Patient with known COPD, centrilobular emphysema. Classification is severe, stage IV. Previously managed by Dr. Meredeth IdeFleming. No significant exacerbations over the past 2 years, except for recent hospitalization for influenza A. Overall management with supplemental oxygen continuously, Spiriva, as needed albuterol.  Plan: -Pulmonary function testing prior to follow-up visit -Continue Spiriva and as needed rescue inhaler

## 2015-11-05 ENCOUNTER — Telehealth: Payer: Self-pay | Admitting: Internal Medicine

## 2015-11-05 DIAGNOSIS — R0600 Dyspnea, unspecified: Secondary | ICD-10-CM

## 2015-11-05 NOTE — Telephone Encounter (Signed)
Pt appt scheduled to see VM with CXR prior. Nothing further needed.

## 2015-11-05 NOTE — Telephone Encounter (Signed)
Pt calling stating the past couple of days she been very short of breath and has a bad cough.  Having to use rescue inhaler a lot more often Hasn't been out of the house in 2 weeks  Not sure if we can send something in or if there is another option No fever just SOB and bad cough Please advise.

## 2015-11-06 ENCOUNTER — Ambulatory Visit
Admission: RE | Admit: 2015-11-06 | Discharge: 2015-11-06 | Disposition: A | Discharge status: Home / Self Care | Payer: PPO | Source: Ambulatory Visit | Attending: Internal Medicine | Admitting: Internal Medicine

## 2015-11-06 ENCOUNTER — Ambulatory Visit (INDEPENDENT_AMBULATORY_CARE_PROVIDER_SITE_OTHER): Payer: PPO | Admitting: Internal Medicine

## 2015-11-06 ENCOUNTER — Encounter: Payer: Self-pay | Admitting: Internal Medicine

## 2015-11-06 VITALS — BP 132/70 | HR 91 | Ht 62.0 in | Wt 168.0 lb

## 2015-11-06 DIAGNOSIS — R0902 Hypoxemia: Secondary | ICD-10-CM | POA: Diagnosis not present

## 2015-11-06 DIAGNOSIS — J449 Chronic obstructive pulmonary disease, unspecified: Secondary | ICD-10-CM | POA: Insufficient documentation

## 2015-11-06 DIAGNOSIS — J441 Chronic obstructive pulmonary disease with (acute) exacerbation: Secondary | ICD-10-CM | POA: Diagnosis not present

## 2015-11-06 DIAGNOSIS — R0600 Dyspnea, unspecified: Secondary | ICD-10-CM

## 2015-11-06 DIAGNOSIS — R0602 Shortness of breath: Secondary | ICD-10-CM | POA: Diagnosis not present

## 2015-11-06 DIAGNOSIS — R05 Cough: Secondary | ICD-10-CM | POA: Diagnosis not present

## 2015-11-06 MED ORDER — AZITHROMYCIN 250 MG PO TABS: ORAL_TABLET | ORAL | Status: AC

## 2015-11-06 MED ORDER — PREDNISONE 20 MG PO TABS: 20.0000 mg | ORAL_TABLET | Freq: Every day | ORAL | Status: DC

## 2015-11-06 NOTE — Assessment & Plan Note (Signed)
Patient in mild COPD exacerbation, symptoms: Increase shortness of breath and sputum production along with cough.  Plan: -Already had chest x-ray which is essentially normal no infiltrates noted on it. -Prednisone 20 mg 5 days -Z-Pak

## 2015-11-06 NOTE — Assessment & Plan Note (Signed)
Patient with known COPD, centrilobular emphysema, now with COPD exacerbation, see plan for COPD exacerbation Classification is severe, stage IV. Previously managed by Dr. Meredeth IdeFleming. No significant exacerbations over the past 2 years, except for recent hospitalization for influenza A. Overall management with supplemental oxygen continuously, Spiriva, as needed albuterol.  Plan: -Pulmonary function testing prior to follow-up visit -Continue Spiriva and as needed rescue inhaler

## 2015-11-06 NOTE — Progress Notes (Signed)
[facility]      MRN# [id number] Sarah MedalJanet Newnam May 30, 1945   CC: Chief Complaint  Patient presents with  . Acute Visit    pt c/o increased sob, occ prod cough white in color, wheezing, legs [id number]       Events since last clinic visit: Asian with known history of COPD on 2 L of oxygen. Presenting today for an acute care visit of shortness of breath and sputum production and increasing over the past 2-3 weeks. She states about 2-3 weeks ago when the pollen was high her symptoms started, she has trace most of her symptoms to environmental changes. Today she endorses increasing cough, sputum production which is thick and white, increasing shortness of breath. She denies any fever, nausea, vomiting, chills.      Medication:   Current Outpatient Rx  Name  Route  Sig  Dispense  Refill  . acetaminophen (TYLENOL) 500 MG tablet   Oral   Take 1,000 mg by mouth at bedtime.         Marland Kitchen. albuterol (PROVENTIL) (2.5 MG/3ML) 0.083% nebulizer solution   Nebulization   Take 3 mLs by nebulization every 6 (six) hours as needed. For wheezing.         Marland Kitchen. ALPRAZolam (XANAX) 0.25 MG tablet   Oral   Take 0.25 mg by mouth 2 (two) times daily as needed. For anxiety.         . montelukast (SINGULAIR) 10 MG tablet   Oral   Take 10 mg by mouth daily.         . potassium chloride SA (K-DUR,KLOR-CON) 20 MEQ tablet   Oral   Take 20 mEq by mouth 2 (two) times daily.         Marland Kitchen. PROAIR HFA 108 (90 Base) MCG/ACT inhaler   Inhalation   Inhale 2 puffs into the lungs See admin instructions. Inhale 2 puffs every 4 to 6 hours as needed for shortness of breath/wheezing.           Dispense as written.   Marland Kitchen. SPIRIVA RESPIMAT 2.5 MCG/ACT AERS   Inhalation   Inhale 2 puffs into the lungs daily.           Dispense as written.   . traMADol (ULTRAM) 50 MG tablet   Oral   Take 50-100 mg by mouth every 6 (six) hours as needed. For pain.         Marland Kitchen.  triamterene-hydrochlorothiazide (MAXZIDE-25) 37.5-25 MG tablet   Oral   Take 1 tablet by mouth daily.            Review of Systems  Constitutional: Negative for fever, chills and malaise/fatigue.  Eyes: Negative for blurred vision.  Respiratory: Positive for cough, sputum production and shortness of breath. Negative for wheezing.   Cardiovascular: Negative for chest pain.  Gastrointestinal: Negative for heartburn, nausea and vomiting.  Genitourinary: Negative for dysuria.  Musculoskeletal: Negative for myalgias.  Neurological: Negative for headaches.  Endo/Heme/Allergies: Does not bruise/bleed easily.      Allergies:  Codeine; Demerol; Morphine and related; Oxycodone; Alendronate; Amoxicillin-pot clavulanate; and Risedronate  Physical Examination:  VS: BP 132/70 mmHg  Pulse 91  Ht 5\' 2"  (1.575 m)  Wt 168 lb (76.204 kg)  BMI 30.72 kg/m2  SpO2 93%  General Appearance: No distress  HEENT: PERRLA, no ptosis, no other lesions noticed Pulmonary:normal breath sounds., diaphragmatic excursion normal.No wheezing, No rales   Cardiovascular:  Normal S1,S2.  No m/r/g.     Abdomen:Exam:  Benign, Soft, non-tender, No masses  Skin:   warm, no rashes, no ecchymosis  Extremities: normal, no cyanosis, clubbing, warm with normal capillary refill.      Rad results: (The following images and results were reviewed by Dr. Dema Severin on 11/06/2015). CXR 11/06/15 CLINICAL DATA: Productive cough. Shortness of breath.  EXAM: CHEST 2 VIEW  COMPARISON: 08/14/2015 11/18/2014.  FINDINGS: Mediastinum hilar structures normal. Changes of bullous COPD . Lungs are clear acute infiltrate. No pleural effusion or pneumothorax. No acute bony abnormality.  IMPRESSION: COPD. No acute pulmonary disease.     Assessment and Plan: 70 year old female presenting for acute visit of COPD exacerbation COPD with hypoxia (HCC) Patient with known COPD, centrilobular emphysema, now with COPD exacerbation,  see plan for COPD exacerbation Classification is severe, stage IV. Previously managed by Dr. Meredeth Ide. No significant exacerbations over the past 2 years, except for recent hospitalization for influenza A. Overall management with supplemental oxygen continuously, Spiriva, as needed albuterol.  Plan: -Pulmonary function testing prior to follow-up visit -Continue Spiriva and as needed rescue inhaler    COPD exacerbation (HCC) Patient in mild COPD exacerbation, symptoms: Increase shortness of breath and sputum production along with cough.  Plan: -Already had chest x-ray which is essentially normal no infiltrates noted on it. -Prednisone 20 mg 5 days -Z-Pak    Updated Medication List Outpatient Encounter Prescriptions as of 11/06/2015  Medication Sig  . acetaminophen (TYLENOL) 500 MG tablet Take 1,000 mg by mouth at bedtime.  Marland Kitchen albuterol (PROVENTIL) (2.5 MG/3ML) 0.083% nebulizer solution Take 3 mLs by nebulization every 6 (six) hours as needed. For wheezing.  Marland Kitchen ALPRAZolam (XANAX) 0.25 MG tablet Take 0.25 mg by mouth 2 (two) times daily as needed. For anxiety.  . montelukast (SINGULAIR) 10 MG tablet Take 10 mg by mouth daily.  . potassium chloride SA (K-DUR,KLOR-CON) 20 MEQ tablet Take 20 mEq by mouth 2 (two) times daily.  Marland Kitchen PROAIR HFA 108 (90 Base) MCG/ACT inhaler Inhale 2 puffs into the lungs See admin instructions. Inhale 2 puffs every 4 to 6 hours as needed for shortness of breath/wheezing.  Marland Kitchen SPIRIVA RESPIMAT 2.5 MCG/ACT AERS Inhale 2 puffs into the lungs daily.  . traMADol (ULTRAM) 50 MG tablet Take 50-100 mg by mouth every 6 (six) hours as needed. For pain.  Marland Kitchen triamterene-hydrochlorothiazide (MAXZIDE-25) 37.5-25 MG tablet Take 1 tablet by mouth daily.   No facility-administered encounter medications on file as of 11/06/2015.    Orders for this visit: No orders of the defined types were placed in this encounter.    Thank  you for the visitation and for allowing  [facility] to assist in the care of your patient. Our recommendations are noted above.  Please contact us if we can be of further service.  Stephanie Acre, MD [facility] Office Number: [phone]  Note: This note was prepared with Dragon dictation along with smaller phrase technology. Any transcriptional errors that result from this process are unintentional.

## 2015-11-06 NOTE — Patient Instructions (Signed)
Follow up with Dr. Dema SeverinMungal in August - keep existing appointment - cont with prescribed O2 - Prednisone 20 mg x 5 days - 1 tab PO x 5 days with breakfast - Zpak - use as directed - cont with current inhalers.

## 2015-11-22 DIAGNOSIS — I272 Other secondary pulmonary hypertension: Secondary | ICD-10-CM | POA: Diagnosis not present

## 2015-11-22 DIAGNOSIS — E785 Hyperlipidemia, unspecified: Secondary | ICD-10-CM | POA: Diagnosis not present

## 2015-11-22 DIAGNOSIS — I10 Essential (primary) hypertension: Secondary | ICD-10-CM | POA: Diagnosis not present

## 2015-11-22 DIAGNOSIS — M810 Age-related osteoporosis without current pathological fracture: Secondary | ICD-10-CM | POA: Diagnosis not present

## 2015-11-23 DIAGNOSIS — M810 Age-related osteoporosis without current pathological fracture: Secondary | ICD-10-CM | POA: Diagnosis not present

## 2015-11-23 DIAGNOSIS — I10 Essential (primary) hypertension: Secondary | ICD-10-CM | POA: Diagnosis not present

## 2015-11-23 DIAGNOSIS — E785 Hyperlipidemia, unspecified: Secondary | ICD-10-CM | POA: Diagnosis not present

## 2015-11-23 DIAGNOSIS — I272 Other secondary pulmonary hypertension: Secondary | ICD-10-CM | POA: Diagnosis not present

## 2015-11-29 DIAGNOSIS — I10 Essential (primary) hypertension: Secondary | ICD-10-CM | POA: Diagnosis not present

## 2015-11-29 DIAGNOSIS — M810 Age-related osteoporosis without current pathological fracture: Secondary | ICD-10-CM | POA: Diagnosis not present

## 2015-11-29 DIAGNOSIS — R609 Edema, unspecified: Secondary | ICD-10-CM | POA: Diagnosis not present

## 2015-11-29 DIAGNOSIS — I272 Other secondary pulmonary hypertension: Secondary | ICD-10-CM | POA: Diagnosis not present

## 2015-11-29 DIAGNOSIS — K8020 Calculus of gallbladder without cholecystitis without obstruction: Secondary | ICD-10-CM | POA: Diagnosis not present

## 2015-11-29 DIAGNOSIS — J449 Chronic obstructive pulmonary disease, unspecified: Secondary | ICD-10-CM | POA: Diagnosis not present

## 2015-12-06 DIAGNOSIS — R5383 Other fatigue: Secondary | ICD-10-CM | POA: Diagnosis not present

## 2015-12-06 DIAGNOSIS — L659 Nonscarring hair loss, unspecified: Secondary | ICD-10-CM | POA: Diagnosis not present

## 2015-12-10 ENCOUNTER — Telehealth: Payer: Self-pay | Admitting: Internal Medicine

## 2015-12-10 ENCOUNTER — Other Ambulatory Visit: Payer: Self-pay

## 2015-12-10 MED ORDER — ALBUTEROL SULFATE (2.5 MG/3ML) 0.083% IN NEBU: 3.0000 mL | INHALATION_SOLUTION | Freq: Four times a day (QID) | RESPIRATORY_TRACT | 1 refills | Status: DC | PRN

## 2015-12-10 NOTE — Telephone Encounter (Signed)
*  STAT* If patient is at the pharmacy, call can be transferred to refill team.   1. Which medications need to be refilled? (please list name of each medication and dose if known) Albubuterol   2. Which pharmacy/location (including street and city if local pharmacy) is medication to be sent to? [facility] [address]  3. Do they need a 30 day or 90 day supply? 90

## 2015-12-10 NOTE — Telephone Encounter (Signed)
RX sent to pt preferred pharmacy. Pt aware. Nothing further needed.

## 2015-12-17 MED ORDER — ALBUTEROL SULFATE (2.5 MG/3ML) 0.083% IN NEBU: 2.5000 mg | INHALATION_SOLUTION | Freq: Four times a day (QID) | RESPIRATORY_TRACT | 2 refills | Status: DC | PRN

## 2015-12-17 NOTE — Telephone Encounter (Signed)
Pt calling stating the pharmacy only gave her a 2w refill with 2 refills on it Would like [place] to call her about this Not sure why they did not give her a 90 day  She states pharmacy said that we ddi not word it correctly  Please advise

## 2015-12-17 NOTE — Telephone Encounter (Signed)
Spoke with pt and she states pharmacy. they state it should read ampules and not MLs. Will resend rx to pharmacy.   Spoke with pt again and informed her Albuterol had been resent. Pt stated at this time that she needs a SMW to be requalified for her O2. SMW appt scheduled and rescheduled VM appt due to scheduling. Pt informed. Nothing further needed.

## 2015-12-26 ENCOUNTER — Ambulatory Visit (INDEPENDENT_AMBULATORY_CARE_PROVIDER_SITE_OTHER): Payer: PPO | Admitting: *Deleted

## 2015-12-26 DIAGNOSIS — R0600 Dyspnea, unspecified: Secondary | ICD-10-CM | POA: Diagnosis not present

## 2015-12-26 LAB — PULMONARY FUNCTION TEST
DL/VA % PRED: 47 %
DL/VA: 2.14 ml/min/mmHg/L
DLCO unc % pred: 43 %
DLCO unc: 9.43 ml/min/mmHg
FEF 25-75 POST: 0.28 L/s
FEF 25-75 PRE: 0.24 L/s
FEF2575-%Change-Post: 17 %
FEF2575-%PRED-PRE: 12 %
FEF2575-%Pred-Post: 15 %
FEV1-%CHANGE-POST: 12 %
FEV1-%PRED-PRE: 24 %
FEV1-%Pred-Post: 28 %
FEV1-PRE: 0.53 L
FEV1-Post: 0.59 L
FEV1FVC-%Change-Post: 10 %
FEV1FVC-%Pred-Pre: 45 %
FEV6-%Change-Post: 6 %
FEV6-%PRED-PRE: 54 %
FEV6-%Pred-Post: 57 %
FEV6-POST: 1.54 L
FEV6-PRE: 1.44 L
FEV6FVC-%Change-Post: 4 %
FEV6FVC-%Pred-Post: 103 %
FEV6FVC-%Pred-Pre: 98 %
FVC-%Change-Post: 2 %
FVC-%PRED-POST: 55 %
FVC-%PRED-PRE: 54 %
FVC-Post: 1.55 L
FVC-Pre: 1.52 L
Post FEV1/FVC ratio: 38 %
Post FEV6/FVC ratio: 99 %
Pre FEV1/FVC ratio: 35 %
Pre FEV6/FVC Ratio: 95 %

## 2015-12-26 NOTE — Progress Notes (Signed)
PFT performed today with nitrogen washout. 

## 2015-12-28 ENCOUNTER — Ambulatory Visit: Payer: PPO | Admitting: Internal Medicine

## 2016-01-02 ENCOUNTER — Ambulatory Visit (INDEPENDENT_AMBULATORY_CARE_PROVIDER_SITE_OTHER): Payer: PPO | Admitting: *Deleted

## 2016-01-02 DIAGNOSIS — J449 Chronic obstructive pulmonary disease, unspecified: Secondary | ICD-10-CM | POA: Diagnosis not present

## 2016-01-02 DIAGNOSIS — R0902 Hypoxemia: Secondary | ICD-10-CM | POA: Diagnosis not present

## 2016-01-02 NOTE — Progress Notes (Signed)
SMW performed today. 

## 2016-01-14 ENCOUNTER — Ambulatory Visit (INDEPENDENT_AMBULATORY_CARE_PROVIDER_SITE_OTHER): Payer: PPO | Admitting: Internal Medicine

## 2016-01-14 ENCOUNTER — Encounter: Payer: Self-pay | Admitting: Internal Medicine

## 2016-01-14 VITALS — BP 128/80 | HR 77 | Ht 61.0 in | Wt 172.0 lb

## 2016-01-14 DIAGNOSIS — R0902 Hypoxemia: Secondary | ICD-10-CM | POA: Diagnosis not present

## 2016-01-14 DIAGNOSIS — J449 Chronic obstructive pulmonary disease, unspecified: Secondary | ICD-10-CM

## 2016-01-14 DIAGNOSIS — J9611 Chronic respiratory failure with hypoxia: Secondary | ICD-10-CM | POA: Diagnosis not present

## 2016-01-14 MED ORDER — FLUTICASONE FUROATE-VILANTEROL 200-25 MCG/INH IN AEPB: 1.0000 | INHALATION_SPRAY | Freq: Every day | RESPIRATORY_TRACT | 0 refills | Status: DC

## 2016-01-14 NOTE — Assessment & Plan Note (Signed)
On 2 L of supplemental oxygen continuously.  6 minute walk test consistent with desaturation only walking 144 m at 3.5 minutes. I suspect she is having increased nocturnal hypoxemia that's causing morning headaches and shortness of breath, I have advised her to increase her oxygen to 2.5 L or even 3 L at night for about 1 week and see if this improves her symptoms. If this does not work then we may need to consider an ONO on 2L O2 to see if she is desaturating on 2L   Plan -continue with 2 L of supplemental oxygen continuously, may use 2 L pulse with exertion.

## 2016-01-14 NOTE — Progress Notes (Signed)
[facility] Pulmonary Medicine Consultation      MRN# [id number] Sarah Mckee Oct 25, 1945   CC: Chief Complaint  Patient presents with  . COPD    Breathing is unchanged since last OV. Reports SOB, wheezing and coughing. Cough is producing mucus at times. Denies chest tightness.      Events since last clinic visit: Patient with known history of COPD on 2 L of oxygen. Presenting today for follow up visit, had PFTs and done last week. Still with SOB and leg swelling (due to eating high salt). SOB with any type of exertion No chest tightness,mild cough, mild production. Stated she is having some mild headaches and mild shortness of breath in the mornings over the past 1-2 weeks.  Has DOE with any type of movement.    Medication:    Current Outpatient Prescriptions:  .  acetaminophen (TYLENOL) 500 MG tablet, Take 1,000 mg by mouth at bedtime., Disp: , Rfl:  .  albuterol (PROVENTIL) (2.5 MG/3ML) 0.083% nebulizer solution, Take 3 mLs (2.5 mg total) by nebulization every 6 (six) hours as needed for wheezing or shortness of breath., Disp: 360 mL, Rfl: 2 .  ALPRAZolam (XANAX) 0.25 MG tablet, Take 0.25 mg by mouth 2 (two) times daily as needed. For anxiety., Disp: , Rfl:  .  montelukast (SINGULAIR) 10 MG tablet, Take 10 mg by mouth daily., Disp: , Rfl:  .  potassium chloride SA (K-DUR,KLOR-CON) 20 MEQ tablet, Take 20 mEq by mouth 2 (two) times daily., Disp: , Rfl:  .  predniSONE (DELTASONE) 20 MG tablet, Take 1 tablet (20 mg total) by mouth daily., Disp: 5 tablet, Rfl: 0 .  PROAIR HFA 108 (90 Base) MCG/ACT inhaler, Inhale 2 puffs into the lungs See admin instructions. Inhale 2 puffs every 4 to 6 hours as needed for shortness of breath/wheezing., Disp: , Rfl:  .  SPIRIVA RESPIMAT 2.5 MCG/ACT AERS, Inhale 2 puffs into the lungs daily., Disp: , Rfl:  .  traMADol (ULTRAM) 50 MG tablet, Take 50-100 mg by mouth every 6 (six) hours as needed. For pain., Disp: , Rfl:  .   triamterene-hydrochlorothiazide (MAXZIDE-25) 37.5-25 MG tablet, Take 1 tablet by mouth daily., Disp: , Rfl:  .  fluticasone furoate-vilanterol (BREO ELLIPTA) 200-25 MCG/INH AEPB, Inhale 1 puff into the lungs daily., Disp: 14 each, Rfl: 0    Review of Systems  Constitutional: Negative for chills, fever and malaise/fatigue.  Eyes: Negative for blurred vision.  Respiratory: Positive for cough, sputum production and shortness of breath. Negative for wheezing.   Cardiovascular: Negative for chest pain.  Gastrointestinal: Negative for heartburn, nausea and vomiting.  Genitourinary: Negative for dysuria.  Musculoskeletal: Negative for myalgias.  Neurological: Negative for headaches.  Endo/Heme/Allergies: Does not bruise/bleed easily.      Allergies:  Codeine; Demerol [meperidine]; Morphine and related; Oxycodone; Alendronate; Amoxicillin-pot clavulanate; and Risedronate  Physical Examination:  VS: BP 128/80 (BP Location: Left Arm, Cuff Size: Normal)   Pulse 77   Ht 5\' 1"  (1.549 m)   Wt 172 lb (78 kg)   SpO2 97%   BMI 32.50 kg/m   General Appearance: No distress  HEENT: PERRLA, no ptosis, no other lesions noticed Pulmonary:normal breath sounds., diaphragmatic excursion normal.No wheezing, No rales   Cardiovascular:  Normal S1,S2.  No m/r/g.     Abdomen:Exam: Benign, Soft, non-tender, No masses  Skin:   warm, no rashes, no ecchymosis  Extremities: normal, no cyanosis, clubbing, warm with normal capillary refill.      Rad results: (The  following images and results were reviewed by Dr. Dema Severin on 01/14/2016). CXR 11/06/15 CLINICAL DATA: Productive cough. Shortness of breath.  EXAM: CHEST 2 VIEW  COMPARISON: 08/14/2015 11/18/2014.  FINDINGS: Mediastinum hilar structures normal. Changes of bullous COPD . Lungs are clear acute infiltrate. No pleural effusion or pneumothorax. No acute bony abnormality.  IMPRESSION: COPD. No acute pulmonary disease.    -  Walked  about and then desaturated to 71%, test stopped and patient placed on 2L O2.     Assessment and Plan: 70 year old female presenting for acute visit of COPD exacerbation COPD with hypoxia (HCC) Patient with known COPD, centrilobular emphysema, now with COPD exacerbation, see plan for COPD exacerbation Classification is severe, stage IV. FEV1 24%, FEV1/FVC 35% Previously managed by Dr. Meredeth Ide. No significant exacerbations over the past 2 years, except for recent hospitalization for influenza A. Overall management with supplemental oxygen continuously, Spiriva, as needed albuterol.  Plan: -Continue Spiriva and as needed rescue inhaler - given severe obstruction on PFTs (FEV1 24%), will also start on Breo 200/25 - 1puff daily, -gargle and rinse after each use.      Chronic respiratory failure (HCC) On 2 L of supplemental oxygen continuously.  6 minute walk test consistent with desaturation only walking 144 m at 3.5 minutes. I suspect she is having increased nocturnal hypoxemia that's causing morning headaches and shortness of breath, I have advised her to increase her oxygen to 2.5 L or even 3 L at night for about 1 week and see if this improves her symptoms. If this does not work then we may need to consider an ONO on 2L O2 to see if she is desaturating on 2L   Plan -continue with 2 L of supplemental oxygen continuously, may use 2 L pulse with exertion.   Updated Medication List Outpatient Encounter Prescriptions as of 01/14/2016  Medication Sig  . acetaminophen (TYLENOL) 500 MG tablet Take 1,000 mg by mouth at bedtime.  Marland Kitchen albuterol (PROVENTIL) (2.5 MG/3ML) 0.083% nebulizer solution Take 3 mLs (2.5 mg total) by nebulization every 6 (six) hours as needed for wheezing or shortness of breath.  . ALPRAZolam (XANAX) 0.25 MG tablet Take 0.25 mg by mouth 2 (two) times daily as needed. For anxiety.  . montelukast (SINGULAIR) 10 MG tablet Take 10 mg by mouth daily.  . potassium  chloride SA (K-DUR,KLOR-CON) 20 MEQ tablet Take 20 mEq by mouth 2 (two) times daily.  . predniSONE (DELTASONE) 20 MG tablet Take 1 tablet (20 mg total) by mouth daily.  Marland Kitchen PROAIR HFA 108 (90 Base) MCG/ACT inhaler Inhale 2 puffs into the lungs See admin instructions. Inhale 2 puffs every 4 to 6 hours as needed for shortness of breath/wheezing.  Marland Kitchen SPIRIVA RESPIMAT 2.5 MCG/ACT AERS Inhale 2 puffs into the lungs daily.  . traMADol (ULTRAM) 50 MG tablet Take 50-100 mg by mouth every 6 (six) hours as needed. For pain.  Marland Kitchen triamterene-hydrochlorothiazide (MAXZIDE-25) 37.5-25 MG tablet Take 1 tablet by mouth daily.  . [DISCONTINUED] albuterol (PROVENTIL) (2.5 MG/3ML) 0.083% nebulizer solution Take 3 mLs by nebulization every 6 (six) hours as needed. For wheezing.   No facility-administered encounter medications on file as of 01/14/2016.     Orders for this visit: No orders of the defined types were placed in this encounter.   Thank  you for the visitation and for allowing  [facility] to assist in the care of your patient. Our recommendations are noted above.  Please contact us if we  can be of further service.  Stephanie AcreVishal Ly Wass, MD [facility] Office Number: [phone]  Note: This note was prepared with Dragon dictation along with smaller phrase technology. Any transcriptional errors that result from this process are unintentional.

## 2016-01-14 NOTE — Patient Instructions (Addendum)
Follow up with Dr. MungaDema Severinl in:3 months - Continue with Spiriva - cont with oxygen 2L continuously - avoid allergens - given severe obstruction on PFTs (FEV1 24%), will also start on Breo 200/25 - 1puff daily, -gargle and rinse after each use. If after one week you see a clinical difference, then call us back and we will give you an Rx for it and the patient assistance forms.

## 2016-01-14 NOTE — Assessment & Plan Note (Addendum)
Patient with known COPD, centrilobular emphysema, now with COPD exacerbation, see plan for COPD exacerbation Classification is severe, stage IV. FEV1 24%, FEV1/FVC 35% Previously managed by Dr. Fleming. No significant exacerbations over the past 2 years, except for recent hospitalization for influenza A. Overall management with supplemental oxygen continuously, Spiriva, as needed albuterol.  Plan: -Continue Spiriva and as needed rescue inhaler - given severe obstruction on PFTs (FEV1 24%), will also start on Breo 200/25 - 1puff daily, -gargle and rinse after each use.     

## 2016-01-21 DIAGNOSIS — J449 Chronic obstructive pulmonary disease, unspecified: Secondary | ICD-10-CM | POA: Diagnosis not present

## 2016-02-12 ENCOUNTER — Telehealth: Payer: Self-pay | Admitting: Internal Medicine

## 2016-02-12 MED ORDER — FLUTICASONE FUROATE-VILANTEROL 200-25 MCG/INH IN AEPB: 1.0000 | INHALATION_SPRAY | Freq: Every day | RESPIRATORY_TRACT | 0 refills | Status: AC

## 2016-02-12 NOTE — Telephone Encounter (Signed)
Pt aware that 2 samples of Breo will be left up front for her to pick up after lunch time. Pt aware & voiced understanding. Nothing further needed.

## 2016-02-12 NOTE — Telephone Encounter (Signed)
Pt states she is in the donut hole and needs another sample of Breo. Please call.

## 2016-02-20 DIAGNOSIS — J449 Chronic obstructive pulmonary disease, unspecified: Secondary | ICD-10-CM | POA: Diagnosis not present

## 2016-03-05 ENCOUNTER — Telehealth: Payer: Self-pay | Admitting: Internal Medicine

## 2016-03-05 DIAGNOSIS — R0600 Dyspnea, unspecified: Secondary | ICD-10-CM

## 2016-03-05 NOTE — Telephone Encounter (Signed)
Pt informed and appt scheduled. Pt will get CXR prior to appt. Nothing further needed.

## 2016-03-05 NOTE — Addendum Note (Signed)
Addended by: Meyer CoryAHMAD, Joesph Marcy R on: 03/05/2016 04:04 PM   Modules accepted: Orders

## 2016-03-05 NOTE — Telephone Encounter (Signed)
Spoke with pt and she states she is more SOB and NP cough and chest tightness. Denies any fever. She is asking you will send her something into the pharmacy. Last seen 01/14/16. Please advise.

## 2016-03-05 NOTE — Telephone Encounter (Signed)
Pt calling stating she's having issues with her breathing again Stated last time this happen we gave her an antibiotic and she is wondering if we need to do this again.  If we decide to send in something please use [facility] [address]. Please advise.

## 2016-03-05 NOTE — Telephone Encounter (Signed)
ACS visit tomorrow.  2 view CXR before the visit for SOB Start albuterol nebs TID   Thanks

## 2016-03-06 ENCOUNTER — Ambulatory Visit
Admission: RE | Admit: 2016-03-06 | Discharge: 2016-03-06 | Disposition: A | Discharge status: Home / Self Care | Payer: PPO | Source: Ambulatory Visit | Attending: Internal Medicine | Admitting: Internal Medicine

## 2016-03-06 ENCOUNTER — Encounter: Payer: Self-pay | Admitting: Internal Medicine

## 2016-03-06 ENCOUNTER — Ambulatory Visit (INDEPENDENT_AMBULATORY_CARE_PROVIDER_SITE_OTHER): Payer: PPO | Admitting: Internal Medicine

## 2016-03-06 VITALS — BP 128/70 | HR 89 | Ht 61.0 in | Wt 173.0 lb

## 2016-03-06 DIAGNOSIS — R0600 Dyspnea, unspecified: Secondary | ICD-10-CM

## 2016-03-06 DIAGNOSIS — R0602 Shortness of breath: Secondary | ICD-10-CM | POA: Diagnosis not present

## 2016-03-06 DIAGNOSIS — J441 Chronic obstructive pulmonary disease with (acute) exacerbation: Secondary | ICD-10-CM

## 2016-03-06 MED ORDER — FLUTICASONE FUROATE-VILANTEROL 200-25 MCG/INH IN AEPB: 1.0000 | INHALATION_SPRAY | Freq: Every day | RESPIRATORY_TRACT | 0 refills | Status: AC

## 2016-03-06 MED ORDER — AZITHROMYCIN 250 MG PO TABS: ORAL_TABLET | ORAL | 0 refills | Status: DC

## 2016-03-06 MED ORDER — PREDNISONE 20 MG PO TABS: 20.0000 mg | ORAL_TABLET | Freq: Every day | ORAL | 0 refills | Status: AC

## 2016-03-06 NOTE — Progress Notes (Signed)
[facility] Pulmonary Medicine Consultation      MRN# [id number] Sarah Mckee 13-Jul-1945   CC: Chief Complaint  Patient presents with  . Acute Visit    Vm pt. pt had CXR today. pt c/o increased, chest congestion, wheezing X4d pt noticed chest discomfort while having cxr today. on 2L 02     HPI  OV 10/19 Low grade fevers, cough, chest congestion acute wheezing last night   Medication:    Current Outpatient Prescriptions:  .  acetaminophen (TYLENOL) 500 MG tablet, Take 1,000 mg by mouth at bedtime., Disp: , Rfl:  .  albuterol (PROVENTIL) (2.5 MG/3ML) 0.083% nebulizer solution, Take 3 mLs (2.5 mg total) by nebulization every 6 (six) hours as needed for wheezing or shortness of breath., Disp: 360 mL, Rfl: 2 .  ALPRAZolam (XANAX) 0.25 MG tablet, Take 0.25 mg by mouth 2 (two) times daily as needed. For anxiety., Disp: , Rfl:  .  fluticasone furoate-vilanterol (BREO ELLIPTA) 200-25 MCG/INH AEPB, Inhale 1 puff into the lungs daily., Disp: 14 each, Rfl: 0 .  montelukast (SINGULAIR) 10 MG tablet, Take 10 mg by mouth daily., Disp: , Rfl:  .  potassium chloride SA (K-DUR,KLOR-CON) 20 MEQ tablet, Take 20 mEq by mouth 2 (two) times daily., Disp: , Rfl:  .  PROAIR HFA 108 (90 Base) MCG/ACT inhaler, Inhale 2 puffs into the lungs See admin instructions. Inhale 2 puffs every 4 to 6 hours as needed for shortness of breath/wheezing., Disp: , Rfl:  .  SPIRIVA RESPIMAT 2.5 MCG/ACT AERS, Inhale 2 puffs into the lungs daily., Disp: , Rfl:  .  traMADol (ULTRAM) 50 MG tablet, Take 50-100 mg by mouth every 6 (six) hours as needed. For pain., Disp: , Rfl:  .  triamterene-hydrochlorothiazide (MAXZIDE-25) 37.5-25 MG tablet, Take 1 tablet by mouth daily., Disp: , Rfl:     Review of Systems  Constitutional: Negative for chills, fever and malaise/fatigue.  Eyes: Negative for blurred vision.  Respiratory: Positive for cough, sputum production and shortness of breath. Negative for wheezing.   Cardiovascular:  Negative for chest pain.  Gastrointestinal: Negative for heartburn, nausea and vomiting.  Genitourinary: Negative for dysuria.  Musculoskeletal: Negative for myalgias.  Neurological: Negative for headaches.  Endo/Heme/Allergies: Does not bruise/bleed easily.      Allergies:  Codeine; Demerol [meperidine]; Morphine and related; Oxycodone; Alendronate; Amoxicillin-pot clavulanate; and Risedronate  Physical Examination:  VS: BP 128/70 (BP Location: Left Arm, Cuff Size: Normal)   Pulse 89   Ht 5\' 1"  (1.549 m)   Wt 173 lb (78.5 kg)   SpO2 97%   BMI 32.69 kg/m   General Appearance: No distress  HEENT: PERRLA, no ptosis, no other lesions noticed Pulmonary:normal breath sounds., diaphragmatic excursion normal.No wheezing, No rales   Cardiovascular:  Normal S1,S2.  No m/r/g.     Abdomen:Exam: Benign, Soft, non-tender, No masses  Skin:   warm, no rashes, no ecchymosis  Extremities: normal, no cyanosis, clubbing, warm with normal capillary refill.      Rad results:  CXR 11/06/15 CLINICAL DATA: Productive cough. Shortness of breath.  EXAM: CHEST 2 VIEW  COMPARISON: 08/14/2015 11/18/2014.  FINDINGS: Mediastinum hilar structures normal. Changes of bullous COPD . Lungs are clear acute infiltrate. No pleural effusion or pneumothorax. No acute bony abnormality.  IMPRESSION: COPD. No acute pulmonary disease.  -  Walked about and then desaturated to 71%, test stopped and patient placed on 2L O2.    CXR 10/19 no acute findings, hyperinflated lungs Full report pending  Assessment and Plan:  70 year old female presenting for acute visit of COPD exacerbation   COPD exacerbation 1.start prednisone 20 mg daily for 7 days 2.start z pak  COPD with hypoxia (HCC) Patient with known COPD, centrilobular emphysema, now with COPD exacerbation Classification is severe, stage IV. FEV1 24%, FEV1/FVC 35% Previously managed by Dr. Meredeth IdeFleming. Overall management with  supplemental oxygen continuously, Spiriva, as needed albuterol. -continue breo and spiriva  Chronic respiratory failure (HCC) On 2 L of supplemental oxygen continuously.   The Patient requires high complexity decision making for assessment and support, frequent evaluation and titration of therapies. Patient satisfied with Plan of action and management. All questions answered  Lucie LeatherKurian David Azadeh Hyder, M.D.  [facility]  Medical Director [facility] Medical Director [facility]

## 2016-03-06 NOTE — Patient Instructions (Signed)
Start prednisone 20 mg daily Start z pak Continue oxygen as needed Continue inhalers as prescribed  Chronic Obstructive Pulmonary Disease Chronic obstructive pulmonary disease (COPD) is a common lung condition in which airflow from the lungs is limited. COPD is a general term that can be used to describe many different lung problems that limit airflow, including both chronic bronchitis and emphysema. If you have COPD, your lung function will probably never return to normal, but there are measures you can take to improve lung function and make yourself feel better. CAUSES   Smoking (common).  Exposure to secondhand smoke.  Genetic problems.  Chronic inflammatory lung diseases or recurrent infections. SYMPTOMS  Shortness of breath, especially with physical activity.  Deep, persistent (chronic) cough with a large amount of thick mucus.  Wheezing.  Rapid breaths (tachypnea).  Gray or bluish discoloration (cyanosis) of the skin, especially in your fingers, toes, or lips.  Fatigue.  Weight loss.  Frequent infections or episodes when breathing symptoms become much worse (exacerbations).  Chest tightness. DIAGNOSIS Your health care provider will take a medical history and perform a physical examination to diagnose COPD. Additional tests for COPD may include:  Lung (pulmonary) function tests.  Chest X-ray.  CT scan.  Blood tests. TREATMENT  Treatment for COPD may include:  Inhaler and nebulizer medicines. These help manage the symptoms of COPD and make your breathing more comfortable.  Supplemental oxygen. Supplemental oxygen is only helpful if you have a low oxygen level in your blood.  Exercise and physical activity. These are beneficial for nearly all people with COPD.  Lung surgery or transplant.  Nutrition therapy to gain weight, if you are underweight.  Pulmonary rehabilitation. This may involve working with a team of health care providers and specialists, such  as respiratory, occupational, and physical therapists. HOME CARE INSTRUCTIONS  Take all medicines (inhaled or pills) as directed by your health care provider.  Avoid over-the-counter medicines or cough syrups that dry up your airway (such as antihistamines) and slow down the elimination of secretions unless instructed otherwise by your health care provider.  If you are a smoker, the most important thing that you can do is stop smoking. Continuing to smoke will cause further lung damage and breathing trouble. Ask your health care provider for help with quitting smoking. He or she can direct you to community resources or hospitals that provide support.  Avoid exposure to irritants such as smoke, chemicals, and fumes that aggravate your breathing.  Use oxygen therapy and pulmonary rehabilitation if directed by your health care provider. If you require home oxygen therapy, ask your health care provider whether you should purchase a pulse oximeter to measure your oxygen level at home.  Avoid contact with individuals who have a contagious illness.  Avoid extreme temperature and humidity changes.  Eat healthy foods. Eating smaller, more frequent meals and resting before meals may help you maintain your strength.  Stay active, but balance activity with periods of rest. Exercise and physical activity will help you maintain your ability to do things you want to do.  Preventing infection and hospitalization is very important when you have COPD. Make sure to receive all the vaccines your health care provider recommends, especially the pneumococcal and influenza vaccines. Ask your health care provider whether you need a pneumonia vaccine.  Learn and use relaxation techniques to manage stress.  Learn and use controlled breathing techniques as directed by your health care provider. Controlled breathing techniques include:  Pursed lip breathing. Start by  breathing in (inhaling) through your nose for 1  second. Then, purse your lips as if you were going to whistle and breathe out (exhale) through the pursed lips for 2 seconds.  Diaphragmatic breathing. Start by putting one hand on your abdomen just above your waist. Inhale slowly through your nose. The hand on your abdomen should move out. Then purse your lips and exhale slowly. You should be able to feel the hand on your abdomen moving in as you exhale.  Learn and use controlled coughing to clear mucus from your lungs. Controlled coughing is a series of short, progressive coughs. The steps of controlled coughing are: 1. Lean your head slightly forward. 2. Breathe in deeply using diaphragmatic breathing. 3. Try to hold your breath for 3 seconds. 4. Keep your mouth slightly open while coughing twice. 5. Spit any mucus out into a tissue. 6. Rest and repeat the steps once or twice as needed. SEEK MEDICAL CARE IF:  You are coughing up more mucus than usual.  There is a change in the color or thickness of your mucus.  Your breathing is more labored than usual.  Your breathing is faster than usual. SEEK IMMEDIATE MEDICAL CARE IF:  You have shortness of breath while you are resting.  You have shortness of breath that prevents you from:  Being able to talk.  Performing your usual physical activities.  You have chest pain lasting longer than 5 minutes.  Your skin color is more cyanotic than usual.  You measure low oxygen saturations for longer than 5 minutes with a pulse oximeter. MAKE SURE YOU:  Understand these instructions.  Will watch your condition.  Will get help right away if you are not doing well or get worse.   This information is not intended to replace advice given to you by your health care provider. Make sure you discuss any questions you have with your health care provider.   Document Released: 02/12/2005 Document Revised: 05/26/2014 Document Reviewed: 12/30/2012 Elsevier Interactive Patient Education [employer]

## 2016-03-22 DIAGNOSIS — J449 Chronic obstructive pulmonary disease, unspecified: Secondary | ICD-10-CM | POA: Diagnosis not present

## 2016-04-01 ENCOUNTER — Emergency Department: Payer: PPO

## 2016-04-01 ENCOUNTER — Emergency Department
Admission: EM | Admit: 2016-04-01 | Discharge: 2016-04-01 | Disposition: A | Discharge status: Home / Self Care | Payer: PPO | Attending: Emergency Medicine | Admitting: Emergency Medicine

## 2016-04-01 DIAGNOSIS — J449 Chronic obstructive pulmonary disease, unspecified: Secondary | ICD-10-CM | POA: Diagnosis not present

## 2016-04-01 DIAGNOSIS — R1013 Epigastric pain: Secondary | ICD-10-CM | POA: Diagnosis not present

## 2016-04-01 DIAGNOSIS — K219 Gastro-esophageal reflux disease without esophagitis: Secondary | ICD-10-CM | POA: Diagnosis not present

## 2016-04-01 DIAGNOSIS — I10 Essential (primary) hypertension: Secondary | ICD-10-CM | POA: Diagnosis not present

## 2016-04-01 DIAGNOSIS — Z87891 Personal history of nicotine dependence: Secondary | ICD-10-CM | POA: Insufficient documentation

## 2016-04-01 DIAGNOSIS — R079 Chest pain, unspecified: Secondary | ICD-10-CM | POA: Diagnosis not present

## 2016-04-01 DIAGNOSIS — R911 Solitary pulmonary nodule: Secondary | ICD-10-CM | POA: Diagnosis not present

## 2016-04-01 DIAGNOSIS — R1011 Right upper quadrant pain: Secondary | ICD-10-CM | POA: Diagnosis not present

## 2016-04-01 LAB — HEPATIC FUNCTION PANEL
ALK PHOS: 54 U/L (ref 38–126)
ALT: 12 U/L — AB (ref 14–54)
AST: 22 U/L (ref 15–41)
Albumin: 3.9 g/dL (ref 3.5–5.0)
BILIRUBIN INDIRECT: 0.8 mg/dL (ref 0.3–0.9)
Bilirubin, Direct: 0.2 mg/dL (ref 0.1–0.5)
TOTAL PROTEIN: 7.3 g/dL (ref 6.5–8.1)
Total Bilirubin: 1 mg/dL (ref 0.3–1.2)

## 2016-04-01 LAB — BASIC METABOLIC PANEL
Anion gap: 8 (ref 5–15)
BUN: 9 mg/dL (ref 6–20)
CALCIUM: 9.1 mg/dL (ref 8.9–10.3)
CHLORIDE: 96 mmol/L — AB (ref 101–111)
CO2: 33 mmol/L — AB (ref 22–32)
CREATININE: 0.62 mg/dL (ref 0.44–1.00)
GFR calc non Af Amer: >60 mL/min (ref >60)
GLUCOSE: 132 mg/dL — AB (ref 65–99)
Potassium: 3.2 mmol/L — ABNORMAL LOW (ref 3.5–5.1)
Sodium: 137 mmol/L (ref 135–145)

## 2016-04-01 LAB — CBC
HCT: 41.1 % (ref 35.0–47.0)
HEMOGLOBIN: 13.9 g/dL (ref 12.0–16.0)
MCH: 30.4 pg (ref 26.0–34.0)
MCHC: 33.7 g/dL (ref 32.0–36.0)
MCV: 90.2 fL (ref 80.0–100.0)
PLATELETS: 157 10*3/uL (ref 150–440)
RBC: 4.56 MIL/uL (ref 3.80–5.20)
RDW: 14 % (ref 11.5–14.5)
WBC: 6.4 10*3/uL (ref 3.6–11.0)

## 2016-04-01 LAB — TROPONIN I: Troponin I: <0.03 ng/mL (ref <0.03)

## 2016-04-01 LAB — LIPASE, BLOOD: Lipase: 24 U/L (ref 11–51)

## 2016-04-01 MED ORDER — ONDANSETRON HCL 4 MG/2ML IJ SOLN
4.0000 mg | Freq: Once | INTRAMUSCULAR | Status: AC
  Administered 2016-04-01: 4 mg via INTRAVENOUS
  Filled 2016-04-01: qty 2

## 2016-04-01 MED ORDER — RANITIDINE HCL 150 MG PO CAPS: 150.0000 mg | ORAL_CAPSULE | Freq: Two times a day (BID) | ORAL | 0 refills | Status: DC

## 2016-04-01 MED ORDER — HYDROMORPHONE HCL 2 MG PO TABS: 1.0000 mg | ORAL_TABLET | Freq: Four times a day (QID) | ORAL | 0 refills | Status: AC | PRN

## 2016-04-01 MED ORDER — LIDOCAINE VISCOUS 2 % MT SOLN
15.0000 mL | Freq: Once | OROMUCOSAL | Status: AC
  Administered 2016-04-01: 15 mL via OROMUCOSAL

## 2016-04-01 MED ORDER — HYDROMORPHONE HCL 1 MG/ML IJ SOLN
0.5000 mg | INTRAMUSCULAR | Status: AC
  Administered 2016-04-01: 0.5 mg via INTRAVENOUS
  Filled 2016-04-01: qty 1

## 2016-04-01 MED ORDER — GI COCKTAIL ~~LOC~~
ORAL | Status: AC
  Filled 2016-04-01: qty 30

## 2016-04-01 MED ORDER — ALUM & MAG HYDROXIDE-SIMETH 200-200-20 MG/5ML PO SUSP
30.0000 mL | Freq: Once | ORAL | Status: AC
  Administered 2016-04-01: 30 mL via ORAL

## 2016-04-01 MED ORDER — LIDOCAINE VISCOUS 2 % MT SOLN
OROMUCOSAL | Status: AC
  Filled 2016-04-01: qty 15

## 2016-04-01 MED ORDER — ALUM & MAG HYDROXIDE-SIMETH 200-200-20 MG/5ML PO SUSP
ORAL | Status: AC
  Filled 2016-04-01: qty 30

## 2016-04-01 MED ORDER — IOPAMIDOL (ISOVUE-370) INJECTION 76%
125.0000 mL | Freq: Once | INTRAVENOUS | Status: AC | PRN
  Administered 2016-04-01: 125 mL via INTRAVENOUS
  Filled 2016-04-01: qty 125

## 2016-04-01 MED ORDER — ALUMINUM-MAGNESIUM-SIMETHICONE 200-200-20 MG/5ML PO SUSP: 30.0000 mL | Freq: Three times a day (TID) | ORAL | 0 refills | Status: AC

## 2016-04-01 MED ORDER — FAMOTIDINE IN NACL 20-0.9 MG/50ML-% IV SOLN
20.0000 mg | Freq: Once | INTRAVENOUS | Status: AC
  Administered 2016-04-01: 20 mg via INTRAVENOUS
  Filled 2016-04-01: qty 50

## 2016-04-01 NOTE — ED Provider Notes (Signed)
[facility] Emergency Department Provider Note  ____________________________________________  Time seen: Approximately 8:06 PM  I have reviewed the triage vital signs and the nursing notes.   HISTORY  Chief Complaint Chest Pain and Abdominal Pain    HPI Sarah Mckee is a 70 y.o. female who complains of episodic epigastric pain radiating around to the back and up into the chest for the past 4 or 5 days. She actually felt good for last 2 days but then today came back again was very severe. Seemed to be worse after eating. She has a history of cholelithiasis that she treat symptomatically as she is not a surgical candidate for cholecystectomy.  Abdominal pain is severe colicky without alleviating factors.  No shortness of breath fever chills or exertional symptoms. No syncope or paresthesias or weakness in any extremity.  Patient reports that she used to be on a PPI due to GERD that was caused by Fosamax, but she is concerned that this would worsen osteoporosis so she stopped taking the PPI on her own. She did not start any other kind of acid suppression therapy. Due to her dietary restrictions with her cholelithiasis, she's been eating a lot of tomatoes, vinegar, onions, and fruits particularly citrus.   Past Medical History:  Diagnosis Date  . Arthritis   . COPD (chronic obstructive pulmonary disease) (HCC)    2L 02 at baseline  . Hypertension   . Pulmonary HTN      Patient Active Problem List   Diagnosis Date Noted  . COPD exacerbation (HCC) 11/06/2015  . Chronic respiratory failure (HCC) 10/03/2015  . COPD with hypoxia (HCC) 10/03/2015  . Acute respiratory failure (HCC) 08/11/2015     Past Surgical History:  Procedure Laterality Date  . none    . TUBAL LIGATION       Prior to Admission medications   Medication Sig Start Date End Date Taking? Authorizing Provider  acetaminophen (TYLENOL) 500 MG tablet Take 1,000 mg by mouth at bedtime.     Historical Provider, MD  albuterol (PROVENTIL) (2.5 MG/3ML) 0.083% nebulizer solution Take 3 mLs (2.5 mg total) by nebulization every 6 (six) hours as needed for wheezing or shortness of breath. 12/17/15   Vishal Mungal, MD  ALPRAZolam (XANAX) 0.25 MG tablet Take 0.25 mg by mouth 2 (two) times daily as needed. For anxiety. 07/18/15   Historical Provider, MD  aluminum-magnesium hydroxide-simethicone (MAALOX) 200-200-20 MG/5ML SUSP Take 30 mLs by mouth 4 (four) times daily -  before meals and at bedtime. 04/01/16   Sharman Cheek, MD  azithromycin (ZITHROMAX Z-PAK) 250 MG tablet Take 2 tablets on Day 1 and then 1 tablet daily till gone. 03/06/16   Erin Fulling, MD  fluticasone furoate-vilanterol (BREO ELLIPTA) 200-25 MCG/INH AEPB Inhale 1 puff into the lungs daily. 01/14/16   Vishal Mungal, MD  HYDROmorphone (DILAUDID) 2 MG tablet Take 0.5 tablets (1 mg total) by mouth every 6 (six) hours as needed for severe pain. 04/01/16 04/01/17  Sharman Cheek, MD  montelukast (SINGULAIR) 10 MG tablet Take 10 mg by mouth daily. 08/08/15   Historical Provider, MD  potassium chloride SA (K-DUR,KLOR-CON) 20 MEQ tablet Take 20 mEq by mouth 2 (two) times daily.    Historical Provider, MD  predniSONE (DELTASONE) 20 MG tablet Take 1 tablet (20 mg total) by mouth daily. 03/06/16 03/06/17  Erin Fulling, MD  PROAIR HFA 108 (90 Base) MCG/ACT inhaler Inhale 2 puffs into the lungs See admin instructions. Inhale 2 puffs every 4 to 6 hours  as needed for shortness of breath/wheezing. 06/13/15   Historical Provider, MD  ranitidine (ZANTAC) 150 MG capsule Take 1 capsule (150 mg total) by mouth 2 (two) times daily. 04/01/16   Sharman CheekPhillip Zsazsa Bahena, MD  SPIRIVA RESPIMAT 2.5 MCG/ACT AERS Inhale 2 puffs into the lungs daily. 07/12/15   Historical Provider, MD  traMADol (ULTRAM) 50 MG tablet Take 50-100 mg by mouth every 6 (six) hours as needed. For pain. 07/12/15   Historical Provider, MD  triamterene-hydrochlorothiazide (MAXZIDE-25) 37.5-25 MG  tablet Take 1 tablet by mouth daily. 08/06/15   Historical Provider, MD     Allergies Codeine; Demerol [meperidine]; Morphine and related; Oxycodone; Alendronate; Amoxicillin-pot clavulanate; and Risedronate   Family History  Problem Relation Age of Onset  . Heart attack Mother   . Cancer Father   . Thyroid disease Father   . Diabetes Sister   . Diabetes Brother     Social History Social History  Substance Use Topics  . Smoking status: Former Smoker    Quit date: 05/20/2007  . Smokeless tobacco: Never Used  . Alcohol use No    Review of Systems  Constitutional:   No fever or chills.  Cardiovascular:   No chest pain. Respiratory:   No dyspnea or cough. Gastrointestinal:   Positive epigastric abdominal pain without vomiting or diarrhea.  Genitourinary:   Negative for dysuria or difficulty urinating. Musculoskeletal:   Negative for focal pain or swelling  10-point ROS otherwise negative.  ____________________________________________   PHYSICAL EXAM:  VITAL SIGNS: ED Triage Vitals  Enc Vitals Group     BP 04/01/16 1338 (!) 149/46     Pulse Rate 04/01/16 1338 72     Resp 04/01/16 1338 18     Temp 04/01/16 1338 98.1 F (36.7 C)     Temp Source 04/01/16 1338 Oral     SpO2 04/01/16 1338 97 %     Weight 04/01/16 1340 172 lb (78 kg)     Height 04/01/16 1340 5\' 2"  (1.575 m)     Head Circumference --      Peak Flow --      Pain Score 04/01/16 1341 10     Pain Loc --      Pain Edu? --      Excl. in GC? --     Vital signs reviewed, nursing assessments reviewed.   Constitutional:   Alert and oriented. Well appearing and in no distress. Eyes:   No scleral icterus. No conjunctival pallor. PERRL. EOMI.  No nystagmus. ENT   Head:   Normocephalic and atraumatic.   Nose:   No congestion/rhinnorhea. No septal hematoma   Mouth/Throat:   MMM, no pharyngeal erythema. No peritonsillar mass.    Neck:   No stridor. No SubQ emphysema. No  meningismus. Hematological/Lymphatic/Immunilogical:   No cervical lymphadenopathy. Cardiovascular:   RRR. Symmetric bilateral radial and DP pulses.  No murmurs.  Respiratory:   Normal respiratory effort without tachypnea nor retractions. Breath sounds are clear and equal bilaterally. No wheezes/rales/rhonchi. Gastrointestinal:   Soft With epigastric tenderness. There is some mild right upper quadrant tenderness as well. Non distended. There is no CVA tenderness.  No rebound, rigidity, or guarding. Genitourinary:   deferred Musculoskeletal:   Nontender with normal range of motion in all extremities. No joint effusions.  No lower extremity tenderness.  No edema. Neurologic:   Normal speech and language.  CN 2-10 normal. Motor grossly intact. No gross focal neurologic deficits are appreciated.  Skin:    Skin is warm,  dry and intact. No rash noted.  No petechiae, purpura, or bullae.  ____________________________________________    LABS (pertinent positives/negatives) (all labs ordered are listed, but only abnormal results are displayed) Labs Reviewed  BASIC METABOLIC PANEL - Abnormal; Notable for the following:       Result Value   Potassium 3.2 (*)    Chloride 96 (*)    CO2 33 (*)    Glucose, Bld 132 (*)    All other components within normal limits  HEPATIC FUNCTION PANEL - Abnormal; Notable for the following:    ALT 12 (*)    All other components within normal limits  CBC  TROPONIN I  LIPASE, BLOOD   ____________________________________________   EKG  Interpreted by me Sinus rhythm rate of 69, normal axis and intervals. Normal QRS ST segments and T waves. 3 PVCs on the strip.  ____________________________________________    RADIOLOGY  CT anterior chest abdomen pelvis reveals no acute pathology. 1. No evidence of pulmonary embolus. 2. Normal caliber thoracic aorta without evidence for dissection or aneurysm. Moderate aortic atherosclerosis. 3. Severe coronary artery  calcification. 4. Moderate emphysema with upper lobe predominance. 5. Calcification at the gallbladder fundus is stable and may represent underlying stone or wall calcification. 6. Enlarged common bile duct measuring up to 11 mm increased from prior CT. No obstructing stone or lesion is identified. No pancreatic ductal dilatation. Correlation with liver function tests is recommended. This can be further characterized with MRI MRCP if clinically indicated. 7. Multiple compression deformities at T3, T7, T11, L1, and L2 are stable. No acute osseous abnormality is evident. 8. Bilateral femoral head foci of avascular necrosis are stable. 9. **An incidental finding of potential clinical significance has been found. New left lower lobe subpleural 9 mm nodule with central lucency, suspected scarring. Consider one of the following in 3 months for both low-risk and high-risk individuals: (a) repeat chest CT, (b) follow-up PET-CT, or (c) tissue sampling. This recommendation follows the consensus statement: Guidelines for Management of Incidental Pulmonary Nodules Detected on CT Images: From the Fleischner Society 2017; Radiology 2017; 284:228-243.**  ____________________________________________   PROCEDURES Procedures  ____________________________________________   INITIAL IMPRESSION / ASSESSMENT AND PLAN / ED COURSE  Pertinent labs & imaging results that were available during my care of the patient were reviewed by me and considered in my medical decision making (see chart for details).  Patient presents with significant abdominal discomfort, epigastric tenderness. Despite her chronic cholelithiasis, actually think her presentation is not consistent with acute cholecystitis. Likely GERD and gastritis, but due to her medical history age and symptomatology, we'll get a CT angiogram to evaluate for vascular injury. Dilaudid and Zofran for symptom control  initially.  ----------------------------------------- 8:08 PM on 04/01/2016 -----------------------------------------  Workup negative. Labs all unremarkable. CT unremarkable. No evidence of acute cholecystitis or acalculous cholecystitis. Abdomen is benign otherwise. Patient now feeling better after an antacid regimen. Tolerating oral intake. Counseled her on food choices in GERD. She'll have to combine this with food choices with her cholelithiasis. She self discontinued PPI due to concerns about osteoporosis, but I advised her she should take an H2 blocker to help prevent these symptoms in the future. Return precautions given. She'll follow up with her pulmonologist Dr. Dema SeverinMungal regarding the incidental finding on CT with the pulmonary nodule for follow-up imaging.  Considering the patient's symptoms, medical history, and physical examination today, I have low suspicion for cholecystitis or biliary pathology, pancreatitis, perforation or bowel obstruction, hernia, intra-abdominal abscess, AAA or dissection,  volvulus or intussusception, mesenteric ischemia, or appendicitis.  No evidence of ACS PE or dissection.     Clinical Course    ____________________________________________   FINAL CLINICAL IMPRESSION(S) / ED DIAGNOSES  Final diagnoses:  Epigastric pain  Gastroesophageal reflux disease, esophagitis presence not specified  Pulmonary nodule seen on imaging study       Portions of this note were generated with dragon dictation software. Dictation errors may occur despite best attempts at proofreading.    Sharman Cheek, MD 04/01/16 2012

## 2016-04-01 NOTE — Discharge Instructions (Signed)
CT report: 1. No evidence of pulmonary embolus.  2. Normal caliber thoracic aorta without evidence for dissection or  aneurysm. Moderate aortic atherosclerosis.  3. Severe coronary artery calcification.  4. Moderate emphysema with upper lobe predominance.  5. Calcification at the gallbladder fundus is stable and may  represent underlying stone or wall calcification.  6. Enlarged common bile duct measuring up to 11 mm increased from  prior CT. No obstructing stone or lesion is identified. No  pancreatic ductal dilatation. Correlation with liver function tests  is recommended. This can be further characterized with MRI MRCP if  clinically indicated.  7. Multiple compression deformities at T3, T7, T11, L1, and L2 are  stable. No acute osseous abnormality is evident.  8. Bilateral femoral head foci of avascular necrosis are stable.  9. **An incidental finding of potential clinical significance has  been found. New left lower lobe subpleural 9 mm nodule with central  lucency, suspected scarring. Consider one of the following in 3  months for both low-risk and high-risk individuals: (a) repeat chest  CT, (b) follow-up PET-CT, or (c) tissue sampling. This  recommendation follows the consensus statement: Guidelines for  Management of Incidental Pulmonary Nodules Detected on CT Images:  From the Fleischner Society 2017; Radiology 2017; 284:228-243.**

## 2016-04-01 NOTE — ED Notes (Signed)
Patient transported to CT 

## 2016-04-01 NOTE — ED Triage Notes (Signed)
Pt c/o epigastric pain that radiates around to the back and into the chest with N/V for the past 4-5 days.the patient is on home O2..Marland Kitchen

## 2016-04-17 ENCOUNTER — Encounter: Payer: Self-pay | Admitting: Internal Medicine

## 2016-04-17 ENCOUNTER — Ambulatory Visit (INDEPENDENT_AMBULATORY_CARE_PROVIDER_SITE_OTHER): Payer: PPO | Admitting: Internal Medicine

## 2016-04-17 VITALS — BP 142/82 | HR 95 | Wt 172.0 lb

## 2016-04-17 DIAGNOSIS — R911 Solitary pulmonary nodule: Secondary | ICD-10-CM | POA: Diagnosis not present

## 2016-04-17 DIAGNOSIS — R0902 Hypoxemia: Secondary | ICD-10-CM

## 2016-04-17 DIAGNOSIS — J449 Chronic obstructive pulmonary disease, unspecified: Secondary | ICD-10-CM

## 2016-04-17 MED ORDER — FLUTICASONE FUROATE-VILANTEROL 200-25 MCG/INH IN AEPB: 1.0000 | INHALATION_SPRAY | Freq: Every day | RESPIRATORY_TRACT | 0 refills | Status: DC

## 2016-04-17 NOTE — Progress Notes (Signed)
[facility] Consultation      MRN# [id number] Sarah MedalJanet Mckee 05-Aug-1945   CC: Chief Complaint  Patient presents with  . Follow-up    recent exacerbation: SOB w/activity: cough this am:       Events since last clinic visit: Patient with known history of COPD on 2 L of oxygen. Presenting today for follow up visit. In October 2018, she had an acute care visit for COPD exacerbation with pulmonary, was given prednisone and antibiotic. Currently doing well today. Noted to have severe abd pain in Mid Nov, presented to ER then, diagnosed with GERD.  Had a CT chest with finding consistent coronary artery diseases and thoracic aorta arthrosclerosis, both of which she Mckee discuss with PMD. No Worsening sob, sputum production, no fever, no chills.    Medication:    Current Outpatient Prescriptions:  .  acetaminophen (TYLENOL) 500 MG tablet, Take 1,000 mg by mouth at bedtime., Disp: , Rfl:  .  albuterol (PROVENTIL) (2.5 MG/3ML) 0.083% nebulizer solution, Take 3 mLs (2.5 mg total) by nebulization every 6 (six) hours as needed for wheezing or shortness of breath., Disp: 360 mL, Rfl: 2 .  ALPRAZolam (XANAX) 0.25 MG tablet, Take 0.25 mg by mouth 2 (two) times daily as needed. For anxiety., Disp: , Rfl:  .  aluminum-magnesium hydroxide-simethicone (MAALOX) 200-200-20 MG/5ML SUSP, Take 30 mLs by mouth 4 (four) times daily -  before meals and at bedtime., Disp: 355 mL, Rfl: 0 .  fluticasone furoate-vilanterol (BREO ELLIPTA) 200-25 MCG/INH AEPB, Inhale 1 puff into the lungs daily., Disp: 14 each, Rfl: 0 .  HYDROmorphone (DILAUDID) 2 MG tablet, Take 0.5 tablets (1 mg total) by mouth every 6 (six) hours as needed for severe pain., Disp: 10 tablet, Rfl: 0 .  montelukast (SINGULAIR) 10 MG tablet, Take 10 mg by mouth daily., Disp: , Rfl:  .  potassium chloride SA (K-DUR,KLOR-CON) 20 MEQ tablet, Take 20 mEq by mouth 2 (two) times daily., Disp: , Rfl:  .  predniSONE (DELTASONE) 20 MG  tablet, Take 1 tablet (20 mg total) by mouth daily., Disp: 7 tablet, Rfl: 0 .  PROAIR HFA 108 (90 Base) MCG/ACT inhaler, Inhale 2 puffs into the lungs See admin instructions. Inhale 2 puffs every 4 to 6 hours as needed for shortness of breath/wheezing., Disp: , Rfl:  .  ranitidine (ZANTAC) 150 MG capsule, Take 1 capsule (150 mg total) by mouth 2 (two) times daily., Disp: 28 capsule, Rfl: 0 .  traMADol (ULTRAM) 50 MG tablet, Take 50-100 mg by mouth every 6 (six) hours as needed. For pain., Disp: , Rfl:  .  triamterene-hydrochlorothiazide (MAXZIDE-25) 37.5-25 MG tablet, Take 1 tablet by mouth daily., Disp: , Rfl:     Review of Systems  Constitutional: Negative for chills, fever and malaise/fatigue.  Eyes: Negative for blurred vision.  Respiratory: Positive for cough, sputum production and shortness of breath. Negative for wheezing.   Cardiovascular: Negative for chest pain.  Gastrointestinal: Negative for heartburn, nausea and vomiting.  Genitourinary: Negative for dysuria.  Musculoskeletal: Negative for myalgias.  Neurological: Negative for headaches.  Endo/Heme/Allergies: Does not bruise/bleed easily.      Allergies:  Codeine; Demerol [meperidine]; Morphine and related; Oxycodone; Alendronate; Amoxicillin-pot clavulanate; and Risedronate  Physical Examination:  VS: BP (!) 142/82 (BP Location: Left Arm, Cuff Size: Normal)   Pulse 95   Wt 172 lb (78 kg)   SpO2 94%   BMI 31.46 kg/m   General Appearance: No distress  HEENT: PERRLA, no ptosis, no  other lesions noticed Pulmonary:normal breath sounds., diaphragmatic excursion normal.No wheezing, No rales   Cardiovascular:  Normal S1,S2.  No m/r/g.     Abdomen:Exam: Benign, Soft, non-tender, No masses  Skin:   warm, no rashes, no ecchymosis  Extremities: normal, no cyanosis, clubbing, warm with normal capillary refill.      Rad results: (The following images and results were reviewed by Dr. Dema SeverinMungal on 04/17/2016). CTA Chest  04/01/16 IMPRESSION: 1. No evidence of pulmonary embolus. 2. Normal caliber thoracic aorta without evidence for dissection or aneurysm. Moderate aortic atherosclerosis. 3. Severe coronary artery calcification. 4. Moderate emphysema with upper lobe predominance. 5. Calcification at the gallbladder fundus is stable and may represent underlying stone or wall calcification. 6. Enlarged common bile duct measuring up to 11 mm increased from prior CT. No obstructing stone or lesion is identified. No pancreatic ductal dilatation. Correlation with liver function tests is recommended. This can be further characterized with MRI MRCP if clinically indicated. 7. Multiple compression deformities at T3, T7, T11, L1, and L2 are stable. No acute osseous abnormality is evident. 8. Bilateral femoral head foci of avascular necrosis are stable. 9. **An incidental finding of potential clinical significance has been found. New left lower lobe subpleural 9 mm nodule with central lucency, suspected scarring. Consider one of the following in 3 months for both low-risk and high-risk individuals: (a) repeat chest CT, (b) follow-up PET-CT, or (c) tissue sampling. This recommendation follows the consensus statement: Guidelines for Management of Incidental Pulmonary Nodules Detected on CT Images: From the Fleischner Society 2017; Radiology 2017; 284:228-243.**     Assessment and Plan: 70 year old female presenting for acute visit of COPD exacerbation Solitary pulmonary nodule-LLL 9mm subpleural nodule/scarring on the LLL. Review of CT Chest in 11/2014 showed a similar finding, probably 187mm-8mm. Most likely a scarring on imaging.  Plan: - repeat CT chest with contrast in 3 months,  COPD with hypoxia (HCC) Patient with known COPD, centrilobular emphysema, now with COPD exacerbation, see plan for COPD exacerbation Classification is severe, stage IV. FEV1 24%, FEV1/FVC 35% Previously managed by Dr. Meredeth IdeFleming. No  significant exacerbations over the past 2 years, except for recent hospitalization for influenza A. Overall management with supplemental oxygen continuously, Spiriva, as needed albuterol.  Plan: -Continue Spiriva and as needed rescue inhaler - given severe obstruction on PFTs (FEV1 24%), Mckee also start on Breo 200/25 - 1puff daily, -gargle and rinse after each use.       Updated Medication List Outpatient Encounter Prescriptions as of 04/17/2016  Medication Sig  . acetaminophen (TYLENOL) 500 MG tablet Take 1,000 mg by mouth at bedtime.  Marland Kitchen. albuterol (PROVENTIL) (2.5 MG/3ML) 0.083% nebulizer solution Take 3 mLs (2.5 mg total) by nebulization every 6 (six) hours as needed for wheezing or shortness of breath.  . ALPRAZolam (XANAX) 0.25 MG tablet Take 0.25 mg by mouth 2 (two) times daily as needed. For anxiety.  Marland Kitchen. aluminum-magnesium hydroxide-simethicone (MAALOX) 200-200-20 MG/5ML SUSP Take 30 mLs by mouth 4 (four) times daily -  before meals and at bedtime.  . fluticasone furoate-vilanterol (BREO ELLIPTA) 200-25 MCG/INH AEPB Inhale 1 puff into the lungs daily.  Marland Kitchen. HYDROmorphone (DILAUDID) 2 MG tablet Take 0.5 tablets (1 mg total) by mouth every 6 (six) hours as needed for severe pain.  . montelukast (SINGULAIR) 10 MG tablet Take 10 mg by mouth daily.  . potassium chloride SA (K-DUR,KLOR-CON) 20 MEQ tablet Take 20 mEq by mouth 2 (two) times daily.  . predniSONE (DELTASONE) 20 MG tablet Take 1  tablet (20 mg total) by mouth daily.  Marland Kitchen PROAIR HFA 108 (90 Base) MCG/ACT inhaler Inhale 2 puffs into the lungs See admin instructions. Inhale 2 puffs every 4 to 6 hours as needed for shortness of breath/wheezing.  . ranitidine (ZANTAC) 150 MG capsule Take 1 capsule (150 mg total) by mouth 2 (two) times daily.  . traMADol (ULTRAM) 50 MG tablet Take 50-100 mg by mouth every 6 (six) hours as needed. For pain.  Marland Kitchen triamterene-hydrochlorothiazide (MAXZIDE-25) 37.5-25 MG tablet Take 1 tablet by mouth daily.  .  [DISCONTINUED] azithromycin (ZITHROMAX Z-PAK) 250 MG tablet Take 2 tablets on Day 1 and then 1 tablet daily till gone.  . [DISCONTINUED] SPIRIVA RESPIMAT 2.5 MCG/ACT AERS Inhale 2 puffs into the lungs daily.   No facility-administered encounter medications on file as of 04/17/2016.     Orders for this visit: No orders of the defined types were placed in this encounter.   Thank  you for the visitation and for allowing  [facility] to assist in the care of your patient. Our recommendations are noted above.  Please contact us if we can be of further service.  Stephanie Acre, MD [facility] Office Number: [phone]  Note: This note was prepared with Dragon dictation along with smaller phrase technology. Any transcriptional errors that result from this process are unintentional.

## 2016-04-17 NOTE — Assessment & Plan Note (Signed)
Patient with known COPD, centrilobular emphysema, now with COPD exacerbation, see plan for COPD exacerbation Classification is severe, stage IV. FEV1 24%, FEV1/FVC 35% Previously managed by Dr. Meredeth IdeFleming. No significant exacerbations over the past 2 years, except for recent hospitalization for influenza A. Overall management with supplemental oxygen continuously, Spiriva, as needed albuterol.  Plan: -Continue Spiriva and as needed rescue inhaler - given severe obstruction on PFTs (FEV1 24%), will also start on Breo 200/25 - 1puff daily, -gargle and rinse after each use.

## 2016-04-17 NOTE — Assessment & Plan Note (Signed)
9mm subpleural nodule/scarring on the LLL. Review of CT Chest in 11/2014 showed a similar finding, probably 307mm-8mm. Most likely a scarring on imaging.  Plan: - repeat CT chest with contrast in 3 months,

## 2016-04-17 NOTE — Patient Instructions (Addendum)
Follow up with Dr. Belia HemanKasa in 3 months - repeat CT chest with contrast in 3 months for pulmonary nodule, LLL - cont with current inhalers and oxygen - please follow up with your PMD of findings of heart disease on your recent CT Chest scan and possible referral to cardiology.  -

## 2016-04-21 DIAGNOSIS — J449 Chronic obstructive pulmonary disease, unspecified: Secondary | ICD-10-CM | POA: Diagnosis not present

## 2016-04-24 DIAGNOSIS — I272 Pulmonary hypertension, unspecified: Secondary | ICD-10-CM | POA: Diagnosis not present

## 2016-04-24 DIAGNOSIS — J449 Chronic obstructive pulmonary disease, unspecified: Secondary | ICD-10-CM | POA: Diagnosis not present

## 2016-04-24 DIAGNOSIS — I10 Essential (primary) hypertension: Secondary | ICD-10-CM | POA: Diagnosis not present

## 2016-04-24 DIAGNOSIS — Z0000 Encounter for general adult medical examination without abnormal findings: Secondary | ICD-10-CM | POA: Diagnosis not present

## 2016-04-24 DIAGNOSIS — I2510 Atherosclerotic heart disease of native coronary artery without angina pectoris: Secondary | ICD-10-CM | POA: Diagnosis not present

## 2016-04-24 DIAGNOSIS — K8020 Calculus of gallbladder without cholecystitis without obstruction: Secondary | ICD-10-CM | POA: Diagnosis not present

## 2016-05-22 DIAGNOSIS — J449 Chronic obstructive pulmonary disease, unspecified: Secondary | ICD-10-CM | POA: Diagnosis not present

## 2016-06-16 ENCOUNTER — Telehealth: Payer: Self-pay | Admitting: Internal Medicine

## 2016-06-16 MED ORDER — TIOTROPIUM BROMIDE MONOHYDRATE 2.5 MCG/ACT IN AERS: 1.0000 | INHALATION_SPRAY | Freq: Every day | RESPIRATORY_TRACT | 3 refills | Status: DC

## 2016-06-16 NOTE — Telephone Encounter (Signed)
°*  STAT* If patient is at the pharmacy, call can be transferred to refill team.   1. Which medications need to be refilled? (please list name of each medication and dose if known) Spriva   2. Which pharmacy/location (including street and city if local pharmacy) is medication to be sent to? [facility] [address]   3. Do they need a 30 day or 90 day supply? 90 day

## 2016-06-16 NOTE — Telephone Encounter (Signed)
Rx sent to preferred pharmacy. Pt aware & voiced her understanding.  Nothing further needed.  

## 2016-06-17 ENCOUNTER — Other Ambulatory Visit: Payer: Self-pay

## 2016-06-17 DIAGNOSIS — R918 Other nonspecific abnormal finding of lung field: Secondary | ICD-10-CM

## 2016-06-19 DIAGNOSIS — J449 Chronic obstructive pulmonary disease, unspecified: Secondary | ICD-10-CM | POA: Diagnosis not present

## 2016-06-19 DIAGNOSIS — I272 Pulmonary hypertension, unspecified: Secondary | ICD-10-CM | POA: Diagnosis not present

## 2016-06-19 DIAGNOSIS — I10 Essential (primary) hypertension: Secondary | ICD-10-CM | POA: Diagnosis not present

## 2016-06-19 DIAGNOSIS — R0789 Other chest pain: Secondary | ICD-10-CM | POA: Diagnosis not present

## 2016-06-22 DIAGNOSIS — J449 Chronic obstructive pulmonary disease, unspecified: Secondary | ICD-10-CM | POA: Diagnosis not present

## 2016-07-04 ENCOUNTER — Telehealth: Payer: Self-pay | Admitting: Internal Medicine

## 2016-07-04 MED ORDER — DOXYCYCLINE HYCLATE 100 MG PO TABS: 100.0000 mg | ORAL_TABLET | Freq: Two times a day (BID) | ORAL | 0 refills | Status: DC

## 2016-07-04 MED ORDER — PREDNISONE 20 MG PO TABS: 40.0000 mg | ORAL_TABLET | Freq: Every day | ORAL | 0 refills | Status: DC

## 2016-07-04 NOTE — Telephone Encounter (Signed)
DK pt last seen 04/17/16. Pt has a drainage, congestion and wheezing. She is asking if we can call her in something. Husband has a cold and is being treated and feels she is getting it now.

## 2016-07-04 NOTE — Telephone Encounter (Signed)
Doxycycline 100 mg BID X 7 days - order sent to pharmacy Increase prednisone to 40 mg daily for 5 days, then resume previous dose  Theodoro Gristave

## 2016-07-04 NOTE — Addendum Note (Signed)
Addended by: Meyer CoryAHMAD, MISTY R on: 07/04/2016 03:11 PM   Modules accepted: Orders

## 2016-07-04 NOTE — Telephone Encounter (Signed)
Pt calling stating she is starting to get some Congestion along with some SOB Would like to know if we can call in something to help this so it doesn't get worst Please advise.

## 2016-07-04 NOTE — Telephone Encounter (Signed)
Pt informed. Nothing further needed. 

## 2016-07-14 ENCOUNTER — Telehealth: Payer: Self-pay | Admitting: Internal Medicine

## 2016-07-14 MED ORDER — FLUTICASONE FUROATE-VILANTEROL 200-25 MCG/INH IN AEPB: 1.0000 | INHALATION_SPRAY | Freq: Every day | RESPIRATORY_TRACT | 0 refills | Status: AC

## 2016-07-14 NOTE — Telephone Encounter (Signed)
Pt aware that a samples of Breo 200 has been placed up front for pick up. Pt aware and voiced her understanding. Nothing her further needed.

## 2016-07-14 NOTE — Telephone Encounter (Signed)
Patient calling the office for samples of medication:   1.  What medication and dosage are you requesting samples for? Brio  2.  Are you currently out of this medication?  Pt only has two pumps left

## 2016-07-17 ENCOUNTER — Ambulatory Visit
Admission: RE | Admit: 2016-07-17 | Discharge: 2016-07-17 | Disposition: A | Discharge status: Home / Self Care | Payer: PPO | Source: Ambulatory Visit | Attending: Internal Medicine | Admitting: Internal Medicine

## 2016-07-17 DIAGNOSIS — J432 Centrilobular emphysema: Secondary | ICD-10-CM | POA: Diagnosis not present

## 2016-07-17 DIAGNOSIS — I2510 Atherosclerotic heart disease of native coronary artery without angina pectoris: Secondary | ICD-10-CM | POA: Diagnosis not present

## 2016-07-17 DIAGNOSIS — R918 Other nonspecific abnormal finding of lung field: Secondary | ICD-10-CM

## 2016-07-17 DIAGNOSIS — I700 Atherosclerosis of aorta: Secondary | ICD-10-CM | POA: Insufficient documentation

## 2016-07-17 DIAGNOSIS — J9809 Other diseases of bronchus, not elsewhere classified: Secondary | ICD-10-CM | POA: Diagnosis not present

## 2016-07-18 ENCOUNTER — Institutional Professional Consult (permissible substitution): Payer: PPO | Admitting: Internal Medicine

## 2016-07-20 DIAGNOSIS — J449 Chronic obstructive pulmonary disease, unspecified: Secondary | ICD-10-CM | POA: Diagnosis not present

## 2016-07-22 DIAGNOSIS — R0789 Other chest pain: Secondary | ICD-10-CM | POA: Diagnosis not present

## 2016-07-23 DIAGNOSIS — H35342 Macular cyst, hole, or pseudohole, left eye: Secondary | ICD-10-CM | POA: Diagnosis not present

## 2016-08-04 ENCOUNTER — Ambulatory Visit (INDEPENDENT_AMBULATORY_CARE_PROVIDER_SITE_OTHER): Payer: PPO | Admitting: Internal Medicine

## 2016-08-04 ENCOUNTER — Encounter: Payer: Self-pay | Admitting: Internal Medicine

## 2016-08-04 VITALS — BP 132/76 | HR 89 | Ht 62.0 in | Wt 178.0 lb

## 2016-08-04 DIAGNOSIS — J9611 Chronic respiratory failure with hypoxia: Secondary | ICD-10-CM

## 2016-08-04 MED ORDER — TIOTROPIUM BROMIDE MONOHYDRATE 2.5 MCG/ACT IN AERS: 2.0000 | INHALATION_SPRAY | Freq: Every day | RESPIRATORY_TRACT | 0 refills | Status: DC

## 2016-08-04 MED ORDER — FLUTICASONE FUROATE-VILANTEROL 200-25 MCG/INH IN AEPB: 1.0000 | INHALATION_SPRAY | Freq: Every day | RESPIRATORY_TRACT | 0 refills | Status: AC

## 2016-08-04 NOTE — Addendum Note (Signed)
Addended by: Maxwell MarionBLANKENSHIP, Shuna Tabor A on: 08/04/2016 02:58 PM   Modules accepted: Orders

## 2016-08-04 NOTE — Progress Notes (Signed)
[facility] Pulmonary Medicine Consultation      MRN# [id number] Sarah Mckee 31-Jul-1995   CC: Chief Complaint  Patient presents with  . Follow-up    VM pt. review CT results.  pt states breathing is baseline. pt reports of sob with exertion & dry cough at times prod with clear mucus.     HPI  OV 10/70 Low grade fevers, cough, chest congestion acute wheezing last night   OV 08/04/68 Chronic SOB/DOE On oxygen Uses inhalers as prescribed No signs of infection at this time CT chest results reviewed with patient  Medication:    Current Outpatient Prescriptions:  .  acetaminophen (TYLENOL) 500 MG tablet, Take 1,000 mg by mouth at bedtime., Disp: , Rfl:  .  albuterol (PROVENTIL) (2.5 MG/3ML) 0.083% nebulizer solution, Take 3 mLs (2.5 mg total) by nebulization every 6 (six) hours as needed for wheezing or shortness of breath., Disp: 360 mL, Rfl: 2 .  ALPRAZolam (XANAX) 0.25 MG tablet, Take 0.25 mg by mouth 2 (two) times daily as needed. For anxiety., Disp: , Rfl:  .  aluminum-magnesium hydroxide-simethicone (MAALOX) 200-200-20 MG/5ML SUSP, Take 30 mLs by mouth 4 (four) times daily -  before meals and at bedtime., Disp: 355 mL, Rfl: 0 .  fluticasone furoate-vilanterol (BREO ELLIPTA) 200-25 MCG/INH AEPB, Inhale 1 puff into the lungs daily., Disp: 1 each, Rfl: 0 .  HYDROmorphone (DILAUDID) 2 MG tablet, Take 0.5 tablets (1 mg total) by mouth every 6 (six) hours as needed for severe pain., Disp: 10 tablet, Rfl: 0 .  montelukast (SINGULAIR) 10 MG tablet, Take 10 mg by mouth daily., Disp: , Rfl:  .  potassium chloride SA (K-DUR,KLOR-CON) 20 MEQ tablet, Take 20 mEq by mouth 2 (two) times daily., Disp: , Rfl:  .  predniSONE (DELTASONE) 20 MG tablet, Take 1 tablet (20 mg total) by mouth daily., Disp: 7 tablet, Rfl: 0 .  PROAIR HFA 108 (90 Base) MCG/ACT inhaler, Inhale 2 puffs into the lungs See admin instructions. Inhale 2 puffs every 4 to 6 hours as needed for shortness of breath/wheezing.,  Disp: , Rfl:  .  ranitidine (ZANTAC) 150 MG capsule, Take 1 capsule (150 mg total) by mouth 2 (two) times daily., Disp: 28 capsule, Rfl: 0 .  Tiotropium Bromide Monohydrate (SPIRIVA RESPIMAT) 2.5 MCG/ACT AERS, Inhale 1 puff into the lungs daily., Disp: 3 Inhaler, Rfl: 3 .  traMADol (ULTRAM) 50 MG tablet, Take 50-100 mg by mouth every 6 (six) hours as needed. For pain., Disp: , Rfl:  .  triamterene-hydrochlorothiazide (MAXZIDE-25) 37.5-25 MG tablet, Take 1 tablet by mouth daily., Disp: , Rfl:     Review of Systems  Constitutional: Negative for chills, fever and malaise/fatigue.  Eyes: Negative for blurred vision.  Respiratory: Positive for cough, sputum production and shortness of breath. Negative for wheezing.   Cardiovascular: Negative for chest pain.  Gastrointestinal: Negative for heartburn, nausea and vomiting.  Genitourinary: Negative for dysuria.  Musculoskeletal: Negative for myalgias.  Neurological: Negative for headaches.  Endo/Heme/Allergies: Does not bruise/bleed easily.      Allergies:  Codeine; Demerol [meperidine]; Morphine and related; Oxycodone; Alendronate; Amoxicillin-pot clavulanate; and Risedronate  Physical Examination:  VS: BP 132/76 (BP Location: Left Arm, Cuff Size: Normal)   Pulse 89   Ht 5\' 2"  (1.575 m)   Wt 178 lb (80.7 kg)   SpO2 92%   BMI 32.56 kg/m   General Appearance: No distress  HEENT: PERRLA, no ptosis, no other lesions noticed Pulmonary:normal breath sounds., diaphragmatic excursion normal.No wheezing, No  rales   Cardiovascular:  Normal S1,S2.  No m/r/g.     Abdomen:Exam: Benign, Soft, non-tender, No masses  Skin:   warm, no rashes, no ecchymosis  Extremities: normal, no cyanosis, clubbing, warm with normal capillary refill.      Rad results:  CT chest 07/3068 I have Independently reviewed images of  CT chest   on 08/04/2016 Interpretation: resolution of LLL nodule   6MWT -  Walked about 144meters and then desaturated to 71%, test  stopped and patient placed on 2L O2.       Assessment and Plan:  71 year old female follow up for end stage COPD Gold Stage D with chronic hypoxic resp failure  Solitary pulmonary nodule-LLL-resolved 9mm subpleural nodule/scarring on the LLL. Review of CT Chest in 11/2014 showed a similar finding, probably 477mm-8mm. Most likely a scarring on imaging-but has now completely resolved.  COPD with hypoxia (HCC) Classification is severe, stage IV. FEV1 24%, FEV1/FVC 35% Overall management with supplemental oxygen continuously, Spiriva, as needed albuterol. -continue breo 200 and spiriva  Chronic respiratory failure (HCC) On 2 L of supplemental oxygen continuously.     Patient satisfied with Plan of action and management. All questions answered Follow up in 4-6 months Samah Lapiana Santiago Gladavid Blu Mcglaun, M.D.  [facility]  Medical Director [facility] Medical Director [facility]

## 2016-08-04 NOTE — Patient Instructions (Signed)
Continue inhalers as prescribed Oxygen as prescribed Action plan dicussed

## 2016-08-05 DIAGNOSIS — H35342 Macular cyst, hole, or pseudohole, left eye: Secondary | ICD-10-CM | POA: Diagnosis not present

## 2016-08-20 DIAGNOSIS — F419 Anxiety disorder, unspecified: Secondary | ICD-10-CM | POA: Diagnosis not present

## 2016-08-20 DIAGNOSIS — Z01818 Encounter for other preprocedural examination: Secondary | ICD-10-CM | POA: Diagnosis not present

## 2016-08-20 DIAGNOSIS — I10 Essential (primary) hypertension: Secondary | ICD-10-CM | POA: Diagnosis not present

## 2016-08-20 DIAGNOSIS — M810 Age-related osteoporosis without current pathological fracture: Secondary | ICD-10-CM | POA: Diagnosis not present

## 2016-08-20 DIAGNOSIS — J449 Chronic obstructive pulmonary disease, unspecified: Secondary | ICD-10-CM | POA: Diagnosis not present

## 2016-08-20 DIAGNOSIS — J439 Emphysema, unspecified: Secondary | ICD-10-CM | POA: Diagnosis not present

## 2016-08-20 DIAGNOSIS — K219 Gastro-esophageal reflux disease without esophagitis: Secondary | ICD-10-CM | POA: Diagnosis not present

## 2016-09-02 ENCOUNTER — Telehealth: Payer: Self-pay | Admitting: Internal Medicine

## 2016-09-02 ENCOUNTER — Other Ambulatory Visit: Payer: Self-pay | Admitting: *Deleted

## 2016-09-02 MED ORDER — ALBUTEROL SULFATE (2.5 MG/3ML) 0.083% IN NEBU: 2.5000 mg | INHALATION_SOLUTION | Freq: Four times a day (QID) | RESPIRATORY_TRACT | 2 refills | Status: DC | PRN

## 2016-09-02 NOTE — Telephone Encounter (Signed)
Informed pt a sample would be placed up front. Informed to contact insurance to get alternatives for a cheaper copay. Pt verbalized understanding. Nothing further needed.

## 2016-09-02 NOTE — Telephone Encounter (Signed)
Patient calling the office for samples of medication:   1.  What medication and dosage are you requesting samples for? Brio  2.  Are you currently out of this medication?  She will use her last one in the morning

## 2016-09-03 DIAGNOSIS — Z0000 Encounter for general adult medical examination without abnormal findings: Secondary | ICD-10-CM | POA: Diagnosis not present

## 2016-09-03 DIAGNOSIS — J449 Chronic obstructive pulmonary disease, unspecified: Secondary | ICD-10-CM | POA: Diagnosis not present

## 2016-09-03 DIAGNOSIS — M19049 Primary osteoarthritis, unspecified hand: Secondary | ICD-10-CM | POA: Diagnosis not present

## 2016-09-03 DIAGNOSIS — K8020 Calculus of gallbladder without cholecystitis without obstruction: Secondary | ICD-10-CM | POA: Diagnosis not present

## 2016-09-03 DIAGNOSIS — R609 Edema, unspecified: Secondary | ICD-10-CM | POA: Diagnosis not present

## 2016-09-03 DIAGNOSIS — F320 Major depressive disorder, single episode, mild: Secondary | ICD-10-CM | POA: Diagnosis not present

## 2016-09-03 DIAGNOSIS — H35349 Macular cyst, hole, or pseudohole, unspecified eye: Secondary | ICD-10-CM | POA: Diagnosis not present

## 2016-09-03 DIAGNOSIS — I272 Pulmonary hypertension, unspecified: Secondary | ICD-10-CM | POA: Diagnosis not present

## 2016-09-03 DIAGNOSIS — I10 Essential (primary) hypertension: Secondary | ICD-10-CM | POA: Diagnosis not present

## 2016-09-10 DIAGNOSIS — K219 Gastro-esophageal reflux disease without esophagitis: Secondary | ICD-10-CM | POA: Diagnosis not present

## 2016-09-10 DIAGNOSIS — M810 Age-related osteoporosis without current pathological fracture: Secondary | ICD-10-CM | POA: Diagnosis not present

## 2016-09-10 DIAGNOSIS — Z87891 Personal history of nicotine dependence: Secondary | ICD-10-CM | POA: Diagnosis not present

## 2016-09-10 DIAGNOSIS — Z9981 Dependence on supplemental oxygen: Secondary | ICD-10-CM | POA: Diagnosis not present

## 2016-09-10 DIAGNOSIS — Z79899 Other long term (current) drug therapy: Secondary | ICD-10-CM | POA: Diagnosis not present

## 2016-09-10 DIAGNOSIS — F419 Anxiety disorder, unspecified: Secondary | ICD-10-CM | POA: Diagnosis not present

## 2016-09-10 DIAGNOSIS — H35342 Macular cyst, hole, or pseudohole, left eye: Secondary | ICD-10-CM | POA: Diagnosis not present

## 2016-09-10 DIAGNOSIS — J439 Emphysema, unspecified: Secondary | ICD-10-CM | POA: Diagnosis not present

## 2016-09-19 DIAGNOSIS — J449 Chronic obstructive pulmonary disease, unspecified: Secondary | ICD-10-CM | POA: Diagnosis not present

## 2016-09-23 ENCOUNTER — Telehealth: Payer: Self-pay | Admitting: Internal Medicine

## 2016-09-23 MED ORDER — FLUTICASONE FUROATE-VILANTEROL 200-25 MCG/INH IN AEPB: 1.0000 | INHALATION_SPRAY | Freq: Every day | RESPIRATORY_TRACT | 5 refills | Status: DC

## 2016-09-23 NOTE — Telephone Encounter (Signed)
°*  STAT* If patient is at the pharmacy, call can be transferred to refill team.   1. Which medications need to be refilled? (please list name of each medication and dose if known)  Brio   2. Which pharmacy/location (including street and city if local pharmacy) is medication to be sent to? [facility] on [address]   3. Do they need a 30 day or 90 day supply?  90 day

## 2016-10-20 DIAGNOSIS — J449 Chronic obstructive pulmonary disease, unspecified: Secondary | ICD-10-CM | POA: Diagnosis not present

## 2016-10-28 DIAGNOSIS — H35342 Macular cyst, hole, or pseudohole, left eye: Secondary | ICD-10-CM | POA: Diagnosis not present

## 2016-11-13 DIAGNOSIS — B351 Tinea unguium: Secondary | ICD-10-CM | POA: Diagnosis not present

## 2016-11-13 DIAGNOSIS — M79674 Pain in right toe(s): Secondary | ICD-10-CM | POA: Diagnosis not present

## 2016-11-13 DIAGNOSIS — M79675 Pain in left toe(s): Secondary | ICD-10-CM | POA: Diagnosis not present

## 2016-11-13 DIAGNOSIS — L600 Ingrowing nail: Secondary | ICD-10-CM | POA: Diagnosis not present

## 2016-11-19 DIAGNOSIS — J449 Chronic obstructive pulmonary disease, unspecified: Secondary | ICD-10-CM | POA: Diagnosis not present

## 2016-12-03 DIAGNOSIS — M1812 Unilateral primary osteoarthritis of first carpometacarpal joint, left hand: Secondary | ICD-10-CM | POA: Diagnosis not present

## 2016-12-10 ENCOUNTER — Other Ambulatory Visit: Payer: Self-pay | Admitting: Internal Medicine

## 2016-12-10 MED ORDER — TIOTROPIUM BROMIDE MONOHYDRATE 2.5 MCG/ACT IN AERS: 2.0000 | INHALATION_SPRAY | Freq: Every day | RESPIRATORY_TRACT | 3 refills | Status: DC

## 2016-12-20 DIAGNOSIS — J449 Chronic obstructive pulmonary disease, unspecified: Secondary | ICD-10-CM | POA: Diagnosis not present

## 2016-12-26 DIAGNOSIS — I10 Essential (primary) hypertension: Secondary | ICD-10-CM | POA: Diagnosis not present

## 2016-12-26 DIAGNOSIS — R7309 Other abnormal glucose: Secondary | ICD-10-CM | POA: Diagnosis not present

## 2016-12-31 DIAGNOSIS — M1811 Unilateral primary osteoarthritis of first carpometacarpal joint, right hand: Secondary | ICD-10-CM | POA: Diagnosis not present

## 2016-12-31 DIAGNOSIS — M1812 Unilateral primary osteoarthritis of first carpometacarpal joint, left hand: Secondary | ICD-10-CM | POA: Diagnosis not present

## 2017-01-02 DIAGNOSIS — J449 Chronic obstructive pulmonary disease, unspecified: Secondary | ICD-10-CM | POA: Diagnosis not present

## 2017-01-02 DIAGNOSIS — R609 Edema, unspecified: Secondary | ICD-10-CM | POA: Diagnosis not present

## 2017-01-02 DIAGNOSIS — I10 Essential (primary) hypertension: Secondary | ICD-10-CM | POA: Diagnosis not present

## 2017-01-02 DIAGNOSIS — E782 Mixed hyperlipidemia: Secondary | ICD-10-CM | POA: Diagnosis not present

## 2017-01-02 DIAGNOSIS — Z1159 Encounter for screening for other viral diseases: Secondary | ICD-10-CM | POA: Diagnosis not present

## 2017-01-02 DIAGNOSIS — I272 Pulmonary hypertension, unspecified: Secondary | ICD-10-CM | POA: Diagnosis not present

## 2017-01-02 DIAGNOSIS — K8020 Calculus of gallbladder without cholecystitis without obstruction: Secondary | ICD-10-CM | POA: Diagnosis not present

## 2017-01-02 DIAGNOSIS — M810 Age-related osteoporosis without current pathological fracture: Secondary | ICD-10-CM | POA: Diagnosis not present

## 2017-01-02 DIAGNOSIS — F320 Major depressive disorder, single episode, mild: Secondary | ICD-10-CM | POA: Diagnosis not present

## 2017-01-20 DIAGNOSIS — J449 Chronic obstructive pulmonary disease, unspecified: Secondary | ICD-10-CM | POA: Diagnosis not present

## 2017-01-27 DIAGNOSIS — H35342 Macular cyst, hole, or pseudohole, left eye: Secondary | ICD-10-CM | POA: Diagnosis not present

## 2017-02-03 ENCOUNTER — Encounter: Payer: Self-pay | Admitting: Internal Medicine

## 2017-02-03 ENCOUNTER — Ambulatory Visit (INDEPENDENT_AMBULATORY_CARE_PROVIDER_SITE_OTHER): Payer: PPO | Admitting: Internal Medicine

## 2017-02-03 VITALS — BP 134/80 | HR 89 | Ht 62.0 in | Wt 184.0 lb

## 2017-02-03 DIAGNOSIS — J9611 Chronic respiratory failure with hypoxia: Secondary | ICD-10-CM | POA: Diagnosis not present

## 2017-02-03 NOTE — Progress Notes (Signed)
[facility] Pulmonary Medicine Consultation      MRN# [id number] Sarah Mckee 05/12/1946   CC: Chief Complaint  Patient presents with  . Follow-up    SOB w/activity: cough at times:    HPI   Chronic SOB/DOE On oxygen Uses inhalers as prescribed No signs of infection at this time No signs of acute heart failure at this time Patient with morbid obesity and deconditioned state    Medication:    Current Outpatient Prescriptions:  .  acetaminophen (TYLENOL) 500 MG tablet, Take 1,000 mg by mouth at bedtime., Disp: , Rfl:  .  albuterol (PROVENTIL) (2.5 MG/3ML) 0.083% nebulizer solution, Take 3 mLs (2.5 mg total) by nebulization every 6 (six) hours as needed for wheezing or shortness of breath., Disp: 360 mL, Rfl: 2 .  ALPRAZolam (XANAX) 0.25 MG tablet, Take 0.25 mg by mouth 2 (two) times daily as needed. For anxiety., Disp: , Rfl:  .  aluminum-magnesium hydroxide-simethicone (MAALOX) 200-200-20 MG/5ML SUSP, Take 30 mLs by mouth 4 (four) times daily -  before meals and at bedtime., Disp: 355 mL, Rfl: 0 .  fluticasone furoate-vilanterol (BREO ELLIPTA) 200-25 MCG/INH AEPB, Inhale 1 puff into the lungs daily., Disp: 1 each, Rfl: 5 .  HYDROmorphone (DILAUDID) 2 MG tablet, Take 0.5 tablets (1 mg total) by mouth every 6 (six) hours as needed for severe pain., Disp: 10 tablet, Rfl: 0 .  montelukast (SINGULAIR) 10 MG tablet, Take 10 mg by mouth daily., Disp: , Rfl:  .  potassium chloride SA (K-DUR,KLOR-CON) 20 MEQ tablet, Take 20 mEq by mouth 2 (two) times daily., Disp: , Rfl:  .  predniSONE (DELTASONE) 20 MG tablet, Take 1 tablet (20 mg total) by mouth daily., Disp: 7 tablet, Rfl: 0 .  PROAIR HFA 108 (90 Base) MCG/ACT inhaler, Inhale 2 puffs into the lungs See admin instructions. Inhale 2 puffs every 4 to 6 hours as needed for shortness of breath/wheezing., Disp: , Rfl:  .  ranitidine (ZANTAC) 150 MG capsule, Take 1 capsule (150 mg total) by mouth 2 (two) times daily., Disp: 28 capsule, Rfl:  0 .  traMADol (ULTRAM) 50 MG tablet, Take 50-100 mg by mouth every 6 (six) hours as needed. For pain., Disp: , Rfl:  .  triamterene-hydrochlorothiazide (MAXZIDE-25) 37.5-25 MG tablet, Take 1 tablet by mouth daily., Disp: , Rfl:  .  Tiotropium Bromide Monohydrate (SPIRIVA RESPIMAT) 2.5 MCG/ACT AERS, Inhale 2 puffs into the lungs daily., Disp: 1 Inhaler, Rfl: 3    Review of Systems  Constitutional: Negative for chills, fever and malaise/fatigue.  Eyes: Negative for blurred vision.  Respiratory: Positive for shortness of breath. Negative for cough, sputum production and wheezing.   Cardiovascular: Negative for chest pain.  Gastrointestinal: Negative for heartburn, nausea and vomiting.  Genitourinary: Negative for dysuria.  Musculoskeletal: Negative for myalgias.  Neurological: Negative for headaches.  Endo/Heme/Allergies: Does not bruise/bleed easily.      Allergies:  Codeine; Demerol [meperidine]; Morphine and related; Oxycodone; Alendronate; Amoxicillin-pot clavulanate; and Risedronate  Physical Examination:  VS: BP 134/80 (BP Location: Left Arm, Cuff Size: Normal)   Pulse 89   Ht  (1.575 m)   Wt 184 lb (83.5 kg)   SpO2 94%   BMI 33.65 kg/m   General Appearance: No distress  HEENT: PERRLA, no ptosis, no other lesions noticed Pulmonary:normal breath sounds., diaphragmatic excursion normal.No wheezing, No rales   Cardiovascular:  Normal S1,S2.  No m/r/g.     Abdomen:Exam: Benign, Soft, non-tender, No masses  Skin:   warm,  no rashes, no ecchymosis  Extremities: normal, no cyanosis, clubbing, warm with normal capillary refill.      Rad results:  CT chest 07/3016 I have Independently reviewed images of  CT chest    Interpretation: resolution of LLL nodule   -  Walked about and then desaturated to 71%, test stopped and patient placed on 2L O2.       Assessment and Plan:  71 year old female follow up for end stage COPD Gold Stage D with chronic  hypoxic resp failure in the setting of morbid obesity and deconditioned state  1.Solitary pulmonary nodule-LLL-resolved 9mm subpleural nodule/scarring on the LLL. Review of CT Chest in 11/2014 showed a similar finding, probably 27mm-8mm. Most likely a scarring on imaging-but has now completely resolved.  2.COPD with hypoxia (HCC) Classification is severe, stage IV. FEV1 24%, FEV1/FVC 35% Overall management with supplemental oxygen continuously, Spiriva, albuterol as needed -continue breo 200 and spiriva respimat 2.5  3.Chronic respiratory failure (HCC) On 2 L of supplemental oxygen continuously.   4. Obesity -recommend significant weight loss -recommend changing diet  5.Deconditioned state -Recommend increased daily activity and exercise    Patient satisfied with Plan of action and management. All questions answered Follow up in 6 months  Harnoor Kohles Santiago Glad, M.D.  [facility]  Medical Director [facility] Medical Director [facility] Cardio-Pulmonary Department

## 2017-02-03 NOTE — Patient Instructions (Addendum)
Continue inhalers as prescribed Continue oxygen as prescribed Follow-up in 6 months Recommend weight loss

## 2017-02-06 ENCOUNTER — Other Ambulatory Visit: Payer: PPO

## 2017-02-10 ENCOUNTER — Other Ambulatory Visit: Payer: PPO

## 2017-02-19 ENCOUNTER — Ambulatory Visit: Admit: 2017-02-19 | Payer: PPO | Admitting: Orthopedic Surgery

## 2017-02-19 DIAGNOSIS — J449 Chronic obstructive pulmonary disease, unspecified: Secondary | ICD-10-CM | POA: Diagnosis not present

## 2017-02-19 SURGERY — CARPOMETACARPAL (CMC) FUSION OF THUMB
Anesthesia: Choice | Laterality: Left

## 2017-02-24 ENCOUNTER — Other Ambulatory Visit: Payer: Self-pay | Admitting: Internal Medicine

## 2017-02-24 MED ORDER — ALBUTEROL SULFATE (2.5 MG/3ML) 0.083% IN NEBU: 2.5000 mg | INHALATION_SOLUTION | Freq: Four times a day (QID) | RESPIRATORY_TRACT | 6 refills | Status: DC | PRN

## 2017-02-27 ENCOUNTER — Other Ambulatory Visit: Payer: Self-pay | Admitting: *Deleted

## 2017-02-27 MED ORDER — ALBUTEROL SULFATE (2.5 MG/3ML) 0.083% IN NEBU: 2.5000 mg | INHALATION_SOLUTION | Freq: Four times a day (QID) | RESPIRATORY_TRACT | 6 refills | Status: DC | PRN

## 2017-03-12 DIAGNOSIS — H2513 Age-related nuclear cataract, bilateral: Secondary | ICD-10-CM | POA: Diagnosis not present

## 2017-03-22 DIAGNOSIS — J449 Chronic obstructive pulmonary disease, unspecified: Secondary | ICD-10-CM | POA: Diagnosis not present

## 2017-03-30 ENCOUNTER — Telehealth: Payer: Self-pay | Admitting: Internal Medicine

## 2017-03-30 NOTE — Telephone Encounter (Signed)
Form placed in DK folder.

## 2017-03-30 NOTE — Telephone Encounter (Signed)
Pt husband dropped off jury summons to have Dr. Belia HemanKasa to verify she is unable to attend. Placed in Pulmonary nurse box.

## 2017-03-31 NOTE — Telephone Encounter (Signed)
Patient husband dropped original copy of jury summons copy  Placed in Nurse Box

## 2017-03-31 NOTE — Telephone Encounter (Signed)
Originals placed in MD folder.

## 2017-04-03 NOTE — Telephone Encounter (Signed)
Pt needs by Dec 3.

## 2017-04-13 NOTE — Telephone Encounter (Signed)
Pt informed per DK he will not excuse her from jury duty. Pt ask to place form up front for her husband to pick up. Nothing further needed

## 2017-04-16 DIAGNOSIS — H2512 Age-related nuclear cataract, left eye: Secondary | ICD-10-CM | POA: Diagnosis not present

## 2017-04-21 DIAGNOSIS — J449 Chronic obstructive pulmonary disease, unspecified: Secondary | ICD-10-CM | POA: Diagnosis not present

## 2017-04-23 DIAGNOSIS — M810 Age-related osteoporosis without current pathological fracture: Secondary | ICD-10-CM | POA: Diagnosis not present

## 2017-04-23 DIAGNOSIS — I10 Essential (primary) hypertension: Secondary | ICD-10-CM | POA: Diagnosis not present

## 2017-04-23 DIAGNOSIS — E782 Mixed hyperlipidemia: Secondary | ICD-10-CM | POA: Diagnosis not present

## 2017-04-23 DIAGNOSIS — Z1159 Encounter for screening for other viral diseases: Secondary | ICD-10-CM | POA: Diagnosis not present

## 2017-04-28 ENCOUNTER — Other Ambulatory Visit: Payer: Self-pay

## 2017-04-28 ENCOUNTER — Ambulatory Visit
Admission: RE | Admit: 2017-04-28 | Discharge: 2017-04-28 | Disposition: A | Discharge status: Home / Self Care | Payer: PPO | Source: Ambulatory Visit | Attending: Ophthalmology | Admitting: Ophthalmology

## 2017-04-28 ENCOUNTER — Ambulatory Visit: Payer: PPO | Admitting: Anesthesiology

## 2017-04-28 ENCOUNTER — Encounter
Admission: RE | Disposition: A | Discharge status: Home / Self Care | Payer: Self-pay | Source: Ambulatory Visit | Attending: Ophthalmology

## 2017-04-28 DIAGNOSIS — R609 Edema, unspecified: Secondary | ICD-10-CM | POA: Insufficient documentation

## 2017-04-28 DIAGNOSIS — Z87891 Personal history of nicotine dependence: Secondary | ICD-10-CM | POA: Insufficient documentation

## 2017-04-28 DIAGNOSIS — F329 Major depressive disorder, single episode, unspecified: Secondary | ICD-10-CM | POA: Insufficient documentation

## 2017-04-28 DIAGNOSIS — Z881 Allergy status to other antibiotic agents status: Secondary | ICD-10-CM | POA: Insufficient documentation

## 2017-04-28 DIAGNOSIS — H2512 Age-related nuclear cataract, left eye: Secondary | ICD-10-CM | POA: Diagnosis not present

## 2017-04-28 DIAGNOSIS — K219 Gastro-esophageal reflux disease without esophagitis: Secondary | ICD-10-CM | POA: Insufficient documentation

## 2017-04-28 DIAGNOSIS — I10 Essential (primary) hypertension: Secondary | ICD-10-CM | POA: Diagnosis not present

## 2017-04-28 DIAGNOSIS — R062 Wheezing: Secondary | ICD-10-CM | POA: Insufficient documentation

## 2017-04-28 DIAGNOSIS — Z885 Allergy status to narcotic agent status: Secondary | ICD-10-CM | POA: Insufficient documentation

## 2017-04-28 DIAGNOSIS — M810 Age-related osteoporosis without current pathological fracture: Secondary | ICD-10-CM | POA: Insufficient documentation

## 2017-04-28 DIAGNOSIS — I7090 Unspecified atherosclerosis: Secondary | ICD-10-CM | POA: Insufficient documentation

## 2017-04-28 DIAGNOSIS — J449 Chronic obstructive pulmonary disease, unspecified: Secondary | ICD-10-CM | POA: Insufficient documentation

## 2017-04-28 DIAGNOSIS — R0601 Orthopnea: Secondary | ICD-10-CM | POA: Insufficient documentation

## 2017-04-28 DIAGNOSIS — Z9981 Dependence on supplemental oxygen: Secondary | ICD-10-CM | POA: Insufficient documentation

## 2017-04-28 HISTORY — PX: CATARACT EXTRACTION W/PHACO: SHX586

## 2017-04-28 SURGERY — PHACOEMULSIFICATION, CATARACT, WITH IOL INSERTION
Anesthesia: Monitor Anesthesia Care | Site: Eye | Laterality: Left | Wound class: Clean

## 2017-04-28 MED ORDER — NA CHONDROIT SULF-NA HYALURON 40-17 MG/ML IO SOLN
INTRAOCULAR | Status: DC | PRN
  Administered 2017-04-28: 1 mL via INTRAOCULAR

## 2017-04-28 MED ORDER — ARMC OPHTHALMIC DILATING DROPS
1.0000 "application " | OPHTHALMIC | Status: AC
  Administered 2017-04-28 (×3): 1 via OPHTHALMIC

## 2017-04-28 MED ORDER — EPINEPHRINE PF 1 MG/ML IJ SOLN
INTRAMUSCULAR | Status: DC | PRN
  Administered 2017-04-28: 09:00:00 via OPHTHALMIC

## 2017-04-28 MED ORDER — SODIUM CHLORIDE 0.9 % IV SOLN
INTRAVENOUS | Status: DC
Start: 2017-04-28 — End: 2017-04-28
  Administered 2017-04-28: 09:00:00 via INTRAVENOUS

## 2017-04-28 MED ORDER — MOXIFLOXACIN HCL 0.5 % OP SOLN
OPHTHALMIC | Status: AC
  Filled 2017-04-28: qty 3

## 2017-04-28 MED ORDER — MIDAZOLAM HCL 2 MG/2ML IJ SOLN
INTRAMUSCULAR | Status: AC
  Filled 2017-04-28: qty 2

## 2017-04-28 MED ORDER — MOXIFLOXACIN HCL 0.5 % OP SOLN: 1.0000 [drp] | OPHTHALMIC | Status: DC | PRN

## 2017-04-28 MED ORDER — ARMC OPHTHALMIC DILATING DROPS
OPHTHALMIC | Status: AC
  Administered 2017-04-28: 1 via OPHTHALMIC
  Filled 2017-04-28: qty 0.4

## 2017-04-28 MED ORDER — POVIDONE-IODINE 5 % OP SOLN
OPHTHALMIC | Status: DC | PRN
  Administered 2017-04-28: 1 via OPHTHALMIC

## 2017-04-28 MED ORDER — CARBACHOL 0.01 % IO SOLN
INTRAOCULAR | Status: DC | PRN
  Administered 2017-04-28: 0.5 mL via INTRAOCULAR

## 2017-04-28 MED ORDER — MIDAZOLAM HCL 2 MG/2ML IJ SOLN
INTRAMUSCULAR | Status: DC | PRN
  Administered 2017-04-28 (×2): 1 mg via INTRAVENOUS

## 2017-04-28 MED ORDER — MOXIFLOXACIN HCL 0.5 % OP SOLN
OPHTHALMIC | Status: DC | PRN
  Administered 2017-04-28: 0.2 mL via OPHTHALMIC

## 2017-04-28 MED ORDER — LIDOCAINE HCL (PF) 4 % IJ SOLN
INTRAOCULAR | Status: DC | PRN
  Administered 2017-04-28: 4 mL via OPHTHALMIC

## 2017-04-28 SURGICAL SUPPLY — 16 items
GLOVE BIO SURGEON STRL SZ8 (GLOVE) ×3 IMPLANT
GLOVE BIOGEL M 6.5 STRL (GLOVE) ×3 IMPLANT
GLOVE SURG LX 8.0 MICRO (GLOVE) ×2
GLOVE SURG LX STRL 8.0 MICRO (GLOVE) ×1 IMPLANT
GOWN STRL REUS W/ TWL LRG LVL3 (GOWN DISPOSABLE) ×2 IMPLANT
GOWN STRL REUS W/TWL LRG LVL3 (GOWN DISPOSABLE) ×4
LABEL CATARACT MEDS ST (LABEL) ×3 IMPLANT
LENS IOL TECNIS ITEC 21.0 (Intraocular Lens) ×3 IMPLANT
PACK CATARACT (MISCELLANEOUS) ×3 IMPLANT
PACK CATARACT BRASINGTON LX (MISCELLANEOUS) ×3 IMPLANT
PACK EYE AFTER SURG (MISCELLANEOUS) ×3 IMPLANT
SOL BSS BAG (MISCELLANEOUS) ×3
SOLUTION BSS BAG (MISCELLANEOUS) ×1 IMPLANT
SYR 5ML LL (SYRINGE) ×3 IMPLANT
WATER STERILE IRR 250ML POUR (IV SOLUTION) ×3 IMPLANT
WIPE NON LINTING 3.25X3.25 (MISCELLANEOUS) ×3 IMPLANT

## 2017-04-28 NOTE — Anesthesia Post-op Follow-up Note (Signed)
Anesthesia QCDR form completed.        

## 2017-04-28 NOTE — H&P (Signed)
All labs reviewed. Abnormal studies sent to patients PCP when indicated.  Previous H&P reviewed, patient examined, there are NO CHANGES.  Sarah Mckee LOUIS12/11/20188:52 AM

## 2017-04-28 NOTE — Anesthesia Postprocedure Evaluation (Signed)
Anesthesia Post Note  Patient: Sharena Dibenedetto  Procedure(s) Performed: CATARACT EXTRACTION PHACO AND INTRAOCULAR LENS PLACEMENT ([place]) (Left Eye)  Patient location during evaluation: PACU Anesthesia Type: MAC Level of consciousness: awake Pain management: pain level controlled Vital Signs Assessment: post-procedure vital signs reviewed and stable Respiratory status: spontaneous breathing Cardiovascular status: blood pressure returned to baseline Postop Assessment: no headache, no backache, no apparent nausea or vomiting and adequate PO intake Anesthetic complications: no     Last Vitals:  Vitals:   04/28/17 0800 04/28/17 0921  BP: 140/81 137/63  Pulse: 96 85  Resp: (!) 22 16  Temp: (!) 35.8 C (!) 36.3 C  SpO2: 100% 100%    Last Pain:  Vitals:   04/28/17 0800  TempSrc: Tympanic                 Keagen Heinlen Lorenza Chick

## 2017-04-28 NOTE — Anesthesia Preprocedure Evaluation (Signed)
Anesthesia Evaluation  Patient identified by MRN, date of birth, ID band Patient awake    Reviewed: Allergy & Precautions, NPO status   Airway Mallampati: II       Dental  (+) Teeth Intact   Pulmonary COPD,  COPD inhaler and oxygen dependent, former smoker,     + decreased breath sounds      Cardiovascular Exercise Tolerance: Poor hypertension, Pt. on medications  Rhythm:Regular Rate:Normal     Neuro/Psych negative neurological ROS  negative psych ROS   GI/Hepatic negative GI ROS, Neg liver ROS,   Endo/Other  negative endocrine ROS  Renal/GU negative Renal ROS     Musculoskeletal   Abdominal Normal abdominal exam  (+)   Peds negative pediatric ROS (+)  Hematology negative hematology ROS (+)   Anesthesia Other Findings   Reproductive/Obstetrics                             Anesthesia Physical Anesthesia Plan  ASA: III  Anesthesia Plan: MAC   Post-op Pain Management:    Induction: Intravenous  PONV Risk Score and Plan: 0  Airway Management Planned: Natural Airway and Nasal Cannula  Additional Equipment:   Intra-op Plan:   Post-operative Plan:   Informed Consent: I have reviewed the patients History and Physical, chart, labs and discussed the procedure including the risks, benefits and alternatives for the proposed anesthesia with the patient or authorized representative who has indicated his/her understanding and acceptance.     Plan Discussed with: CRNA  Anesthesia Plan Comments:         Anesthesia Quick Evaluation

## 2017-04-28 NOTE — Discharge Instructions (Signed)
Eye Surgery Discharge Instructions  Expect mild scratchy sensation or mild soreness. DO NOT RUB YOUR EYE!  The day of surgery:  Minimal physical activity, but bed rest is not required  No reading, computer work, or close hand work  No bending, lifting, or straining.  May watch TV  For 24 hours:  No driving, legal decisions, or alcoholic beverages  Safety precautions  Eat anything you prefer: It is better to start with liquids, then soup then solid foods.  _____ Eye patch should be worn until postoperative exam tomorrow.  ____ Solar shield eyeglasses should be worn for comfort in the sunlight/patch while sleeping  Resume all regular medications including aspirin or Coumadin if these were discontinued prior to surgery. You may shower, bathe, shave, or wash your hair. Tylenol may be taken for mild discomfort.  Call your doctor if you experience significant pain, nausea, or vomiting, fever > 101 or other signs of infection. [phone] or [phone] Specific instructions:  Follow-up Information    Galen ManilaPorfilio, William, MD Follow up on 04/29/2017.   Specialty:  Ophthalmology Why:  9:50 AM Contact information: [address] [place] [address] [phone]         Follow eye drop schedule

## 2017-04-28 NOTE — Op Note (Signed)
PREOPERATIVE DIAGNOSIS:  Nuclear sclerotic cataract of the left eye.   POSTOPERATIVE DIAGNOSIS:  Nuclear sclerotic cataract of the left eye.   OPERATIVE PROCEDURE: Procedure(s): CATARACT EXTRACTION PHACO AND INTRAOCULAR LENS PLACEMENT ([facility])   SURGEON:  Sarah ManilaWilliam Harlow Basley, Sarah Mckee.   ANESTHESIA:  Anesthesiologist: Darleene CleaverVan Staveren, Gerrit HeckGijsbertus F, MD CRNA: Karoline CaldwellStarr, Deana, CRNA  1.      Managed anesthesia care. 2.     0.321ml of Shugarcaine was instilled following the paracentesis   COMPLICATIONS:  None.   TECHNIQUE:   Stop and chop   DESCRIPTION OF PROCEDURE:  The patient was examined and consented in the preoperative holding area where the aforementioned topical anesthesia was applied to the left eye and then brought back to the Operating Room where the left eye was prepped and draped in the usual sterile ophthalmic fashion and a lid speculum was placed. A paracentesis was created with the side port blade and the anterior chamber was filled with viscoelastic. A near clear corneal incision was performed with the steel keratome. A continuous curvilinear capsulorrhexis was performed with a cystotome followed by the capsulorrhexis forceps. Hydrodissection and hydrodelineation were carried out with BSS on a blunt cannula. The lens was removed in a stop and chop  technique and the remaining cortical material was removed with the irrigation-aspiration handpiece. The capsular bag was inflated with viscoelastic and the Technis [id number] lens was placed in the capsular bag without complication. The remaining viscoelastic was removed from the eye with the irrigation-aspiration handpiece. The wounds were hydrated. The anterior chamber was flushed with Miostat and the eye was inflated to physiologic pressure. 0.491ml Vigamox was placed in the anterior chamber. The wounds were found to be water tight. The eye was dressed with Vigamox. The patient was given protective glasses to wear throughout the day and a shield with which to  sleep tonight. The patient was also given drops with which to begin a drop regimen today and will follow-up with me in one day. Implant Name Type Inv. Item Serial No. Manufacturer Lot No. LRB No. Used  LENS IOL DIOP 21.0 - [id number] Intraocular Lens LENS IOL DIOP 21.0 [id number]  Left 1    Procedure(s) with comments: CATARACT EXTRACTION PHACO AND INTRAOCULAR LENS PLACEMENT ([facility]) (Left) - US 01:00.3 AP% 19.7 CDE 11.88 Fluid Pack lot # [id number]  Electronically signed: Tyria Springer LOUIS 04/28/2017 9:21 AM

## 2017-04-28 NOTE — Transfer of Care (Signed)
Immediate Anesthesia Transfer of Care Note  Patient: Sarah Mckee  Procedure(s) Performed: CATARACT EXTRACTION PHACO AND INTRAOCULAR LENS PLACEMENT ([facility]) (Left Eye)  Patient Location: PACU  Anesthesia Type:MAC  Level of Consciousness: awake, alert  and oriented  Airway & Oxygen Therapy: Patient Spontanous Breathing  Post-op Assessment: Report given to RN and Post -op Vital signs reviewed and stable  Post vital signs: Reviewed and stable  Last Vitals:  Vitals:   04/28/17 0800 04/28/17 0921  BP: 140/81 137/63  Pulse: 96 79  Resp: (!) 22   Temp: (!) 35.8 C (!) 36.3 C  SpO2: 100% 100%    Last Pain:  Vitals:   04/28/17 0800  TempSrc: Tympanic         Complications: No apparent anesthesia complications

## 2017-04-29 ENCOUNTER — Encounter: Payer: Self-pay | Admitting: Ophthalmology

## 2017-04-30 DIAGNOSIS — Z0000 Encounter for general adult medical examination without abnormal findings: Secondary | ICD-10-CM | POA: Diagnosis not present

## 2017-04-30 DIAGNOSIS — J449 Chronic obstructive pulmonary disease, unspecified: Secondary | ICD-10-CM | POA: Diagnosis not present

## 2017-04-30 DIAGNOSIS — Z6834 Body mass index (BMI) 34.0-34.9, adult: Secondary | ICD-10-CM | POA: Diagnosis not present

## 2017-04-30 DIAGNOSIS — I10 Essential (primary) hypertension: Secondary | ICD-10-CM | POA: Diagnosis not present

## 2017-04-30 DIAGNOSIS — E669 Obesity, unspecified: Secondary | ICD-10-CM | POA: Diagnosis not present

## 2017-04-30 DIAGNOSIS — F3341 Major depressive disorder, recurrent, in partial remission: Secondary | ICD-10-CM | POA: Diagnosis not present

## 2017-04-30 DIAGNOSIS — I272 Pulmonary hypertension, unspecified: Secondary | ICD-10-CM | POA: Diagnosis not present

## 2017-04-30 DIAGNOSIS — Z23 Encounter for immunization: Secondary | ICD-10-CM | POA: Diagnosis not present

## 2017-04-30 DIAGNOSIS — M810 Age-related osteoporosis without current pathological fracture: Secondary | ICD-10-CM | POA: Diagnosis not present

## 2017-05-21 ENCOUNTER — Other Ambulatory Visit: Payer: Self-pay | Admitting: Internal Medicine

## 2017-05-22 DIAGNOSIS — J449 Chronic obstructive pulmonary disease, unspecified: Secondary | ICD-10-CM | POA: Diagnosis not present

## 2017-06-22 DIAGNOSIS — J449 Chronic obstructive pulmonary disease, unspecified: Secondary | ICD-10-CM | POA: Diagnosis not present

## 2017-06-27 ENCOUNTER — Inpatient Hospital Stay
Admission: EM | Admit: 2017-06-27 | Discharge: 2017-06-30 | DRG: 190 | Disposition: A | Discharge status: Home Health | Payer: PPO | Attending: Internal Medicine | Admitting: Internal Medicine

## 2017-06-27 ENCOUNTER — Other Ambulatory Visit: Payer: Self-pay

## 2017-06-27 ENCOUNTER — Emergency Department: Payer: PPO

## 2017-06-27 ENCOUNTER — Encounter: Payer: Self-pay | Admitting: Emergency Medicine

## 2017-06-27 DIAGNOSIS — Z91013 Allergy to seafood: Secondary | ICD-10-CM

## 2017-06-27 DIAGNOSIS — Z880 Allergy status to penicillin: Secondary | ICD-10-CM

## 2017-06-27 DIAGNOSIS — R0602 Shortness of breath: Secondary | ICD-10-CM | POA: Diagnosis not present

## 2017-06-27 DIAGNOSIS — Z8249 Family history of ischemic heart disease and other diseases of the circulatory system: Secondary | ICD-10-CM

## 2017-06-27 DIAGNOSIS — S22009A Unspecified fracture of unspecified thoracic vertebra, initial encounter for closed fracture: Secondary | ICD-10-CM | POA: Diagnosis not present

## 2017-06-27 DIAGNOSIS — S22060A Wedge compression fracture of T7-T8 vertebra, initial encounter for closed fracture: Secondary | ICD-10-CM | POA: Diagnosis present

## 2017-06-27 DIAGNOSIS — M810 Age-related osteoporosis without current pathological fracture: Secondary | ICD-10-CM | POA: Diagnosis not present

## 2017-06-27 DIAGNOSIS — I10 Essential (primary) hypertension: Secondary | ICD-10-CM | POA: Diagnosis not present

## 2017-06-27 DIAGNOSIS — X58XXXA Exposure to other specified factors, initial encounter: Secondary | ICD-10-CM | POA: Diagnosis present

## 2017-06-27 DIAGNOSIS — I890 Lymphedema, not elsewhere classified: Secondary | ICD-10-CM | POA: Diagnosis present

## 2017-06-27 DIAGNOSIS — F419 Anxiety disorder, unspecified: Secondary | ICD-10-CM | POA: Diagnosis present

## 2017-06-27 DIAGNOSIS — K219 Gastro-esophageal reflux disease without esophagitis: Secondary | ICD-10-CM | POA: Diagnosis not present

## 2017-06-27 DIAGNOSIS — I272 Pulmonary hypertension, unspecified: Secondary | ICD-10-CM | POA: Diagnosis not present

## 2017-06-27 DIAGNOSIS — Z9981 Dependence on supplemental oxygen: Secondary | ICD-10-CM

## 2017-06-27 DIAGNOSIS — Z79899 Other long term (current) drug therapy: Secondary | ICD-10-CM | POA: Diagnosis not present

## 2017-06-27 DIAGNOSIS — Z87891 Personal history of nicotine dependence: Secondary | ICD-10-CM

## 2017-06-27 DIAGNOSIS — Z885 Allergy status to narcotic agent status: Secondary | ICD-10-CM | POA: Diagnosis not present

## 2017-06-27 DIAGNOSIS — M549 Dorsalgia, unspecified: Secondary | ICD-10-CM | POA: Diagnosis not present

## 2017-06-27 DIAGNOSIS — J9621 Acute and chronic respiratory failure with hypoxia: Secondary | ICD-10-CM | POA: Diagnosis not present

## 2017-06-27 DIAGNOSIS — J441 Chronic obstructive pulmonary disease with (acute) exacerbation: Secondary | ICD-10-CM | POA: Diagnosis not present

## 2017-06-27 DIAGNOSIS — M1990 Unspecified osteoarthritis, unspecified site: Secondary | ICD-10-CM | POA: Diagnosis present

## 2017-06-27 DIAGNOSIS — Z888 Allergy status to other drugs, medicaments and biological substances status: Secondary | ICD-10-CM | POA: Diagnosis not present

## 2017-06-27 LAB — COMPREHENSIVE METABOLIC PANEL
ALK PHOS: 67 U/L (ref 38–126)
ALT: 20 U/L (ref 14–54)
AST: 21 U/L (ref 15–41)
Albumin: 3.7 g/dL (ref 3.5–5.0)
Anion gap: 10 (ref 5–15)
BILIRUBIN TOTAL: 0.9 mg/dL (ref 0.3–1.2)
BUN: 18 mg/dL (ref 6–20)
CALCIUM: 9 mg/dL (ref 8.9–10.3)
CO2: 34 mmol/L — ABNORMAL HIGH (ref 22–32)
Chloride: 96 mmol/L — ABNORMAL LOW (ref 101–111)
Creatinine, Ser: 0.63 mg/dL (ref 0.44–1.00)
GFR calc non Af Amer: >60 mL/min (ref >60)
Glucose, Bld: 136 mg/dL — ABNORMAL HIGH (ref 65–99)
Potassium: 3.9 mmol/L (ref 3.5–5.1)
Sodium: 140 mmol/L (ref 135–145)
TOTAL PROTEIN: 7.3 g/dL (ref 6.5–8.1)

## 2017-06-27 LAB — CBC WITH DIFFERENTIAL/PLATELET
Basophils Absolute: 0 10*3/uL (ref 0–0.1)
Basophils Relative: 0 %
EOS ABS: 0.1 10*3/uL (ref 0–0.7)
Eosinophils Relative: 1 %
HEMATOCRIT: 42.5 % (ref 35.0–47.0)
HEMOGLOBIN: 14.2 g/dL (ref 12.0–16.0)
LYMPHS ABS: 1.1 10*3/uL (ref 1.0–3.6)
Lymphocytes Relative: 14 %
MCH: 30.5 pg (ref 26.0–34.0)
MCHC: 33.3 g/dL (ref 32.0–36.0)
MCV: 91.6 fL (ref 80.0–100.0)
MONO ABS: 0.6 10*3/uL (ref 0.2–0.9)
MONOS PCT: 8 %
NEUTROS PCT: 77 %
Neutro Abs: 5.9 10*3/uL (ref 1.4–6.5)
Platelets: 190 10*3/uL (ref 150–440)
RBC: 4.64 MIL/uL (ref 3.80–5.20)
RDW: 13.4 % (ref 11.5–14.5)
WBC: 7.7 10*3/uL (ref 3.6–11.0)

## 2017-06-27 LAB — TROPONIN I

## 2017-06-27 LAB — BRAIN NATRIURETIC PEPTIDE: B Natriuretic Peptide: 58 pg/mL (ref 0.0–100.0)

## 2017-06-27 MED ORDER — IPRATROPIUM-ALBUTEROL 0.5-2.5 (3) MG/3ML IN SOLN
3.0000 mL | Freq: Four times a day (QID) | RESPIRATORY_TRACT | Status: DC
  Administered 2017-06-27 – 2017-06-28 (×2): 3 mL via RESPIRATORY_TRACT
  Filled 2017-06-27 (×2): qty 3

## 2017-06-27 MED ORDER — TRAMADOL HCL 50 MG PO TABS: 100.0000 mg | ORAL_TABLET | Freq: Four times a day (QID) | ORAL | Status: DC | PRN

## 2017-06-27 MED ORDER — IPRATROPIUM-ALBUTEROL 0.5-2.5 (3) MG/3ML IN SOLN: 3.0000 mL | RESPIRATORY_TRACT | Status: DC | PRN

## 2017-06-27 MED ORDER — SODIUM CHLORIDE 0.9 % IV SOLN: 250.0000 mL | INTRAVENOUS | Status: DC | PRN

## 2017-06-27 MED ORDER — KETOROLAC TROMETHAMINE 30 MG/ML IJ SOLN
30.0000 mg | Freq: Four times a day (QID) | INTRAMUSCULAR | Status: AC
  Administered 2017-06-27 – 2017-06-28 (×5): 30 mg via INTRAVENOUS
  Filled 2017-06-27 (×5): qty 1

## 2017-06-27 MED ORDER — POLYETHYL GLYCOL-PROPYL GLYCOL 0.4-0.3 % OP GEL: Freq: Every day | OPHTHALMIC | Status: DC

## 2017-06-27 MED ORDER — ACETAMINOPHEN 650 MG RE SUPP: 650.0000 mg | Freq: Four times a day (QID) | RECTAL | Status: DC | PRN

## 2017-06-27 MED ORDER — GUAIFENESIN ER 600 MG PO TB12
600.0000 mg | ORAL_TABLET | Freq: Two times a day (BID) | ORAL | Status: DC
  Administered 2017-06-27 – 2017-06-30 (×7): 600 mg via ORAL
  Filled 2017-06-27 (×7): qty 1

## 2017-06-27 MED ORDER — IPRATROPIUM-ALBUTEROL 0.5-2.5 (3) MG/3ML IN SOLN
3.0000 mL | RESPIRATORY_TRACT | Status: DC
  Administered 2017-06-27: 3 mL via RESPIRATORY_TRACT
  Filled 2017-06-27: qty 3

## 2017-06-27 MED ORDER — HYDROMORPHONE HCL 1 MG/ML IJ SOLN
0.2500 mg | Freq: Once | INTRAMUSCULAR | Status: AC
  Administered 2017-06-27: 0.25 mg via INTRAVENOUS
  Filled 2017-06-27: qty 1

## 2017-06-27 MED ORDER — HEPARIN SODIUM (PORCINE) 5000 UNIT/ML IJ SOLN
5000.0000 [IU] | Freq: Three times a day (TID) | INTRAMUSCULAR | Status: DC
  Administered 2017-06-27 – 2017-06-30 (×10): 5000 [IU] via SUBCUTANEOUS
  Filled 2017-06-27 (×9): qty 1

## 2017-06-27 MED ORDER — METHYLPREDNISOLONE SODIUM SUCC 125 MG IJ SOLR
60.0000 mg | Freq: Four times a day (QID) | INTRAMUSCULAR | Status: DC
  Administered 2017-06-27 – 2017-06-30 (×13): 60 mg via INTRAVENOUS
  Filled 2017-06-27 (×13): qty 2

## 2017-06-27 MED ORDER — ACETAMINOPHEN 325 MG PO TABS
650.0000 mg | ORAL_TABLET | Freq: Four times a day (QID) | ORAL | Status: DC | PRN
  Administered 2017-06-29 (×2): 650 mg via ORAL
  Filled 2017-06-27 (×2): qty 2

## 2017-06-27 MED ORDER — TIOTROPIUM BROMIDE MONOHYDRATE 2.5 MCG/ACT IN AERS: 2.0000 | INHALATION_SPRAY | Freq: Every day | RESPIRATORY_TRACT | Status: DC

## 2017-06-27 MED ORDER — METHYLPREDNISOLONE SODIUM SUCC 125 MG IJ SOLR
125.0000 mg | Freq: Once | INTRAMUSCULAR | Status: AC
  Administered 2017-06-27: 125 mg via INTRAVENOUS
  Filled 2017-06-27: qty 2

## 2017-06-27 MED ORDER — SODIUM CHLORIDE 0.9% FLUSH: 3.0000 mL | INTRAVENOUS | Status: DC | PRN

## 2017-06-27 MED ORDER — TRIAMTERENE-HCTZ 37.5-25 MG PO TABS
1.0000 | ORAL_TABLET | Freq: Every day | ORAL | Status: DC
  Administered 2017-06-27 – 2017-06-30 (×4): 1 via ORAL
  Filled 2017-06-27 (×4): qty 1

## 2017-06-27 MED ORDER — MONTELUKAST SODIUM 10 MG PO TABS
10.0000 mg | ORAL_TABLET | Freq: Every day | ORAL | Status: DC
  Administered 2017-06-27 – 2017-06-30 (×4): 10 mg via ORAL
  Filled 2017-06-27 (×4): qty 1

## 2017-06-27 MED ORDER — IPRATROPIUM-ALBUTEROL 0.5-2.5 (3) MG/3ML IN SOLN
3.0000 mL | Freq: Once | RESPIRATORY_TRACT | Status: AC
  Administered 2017-06-27: 3 mL via RESPIRATORY_TRACT
  Filled 2017-06-27: qty 3

## 2017-06-27 MED ORDER — ONDANSETRON HCL 4 MG/2ML IJ SOLN: 4.0000 mg | Freq: Four times a day (QID) | INTRAMUSCULAR | Status: DC | PRN

## 2017-06-27 MED ORDER — SODIUM CHLORIDE 0.9% FLUSH
3.0000 mL | Freq: Two times a day (BID) | INTRAVENOUS | Status: DC
  Administered 2017-06-27 – 2017-06-30 (×7): 3 mL via INTRAVENOUS

## 2017-06-27 MED ORDER — ONDANSETRON HCL 4 MG PO TABS
4.0000 mg | ORAL_TABLET | Freq: Four times a day (QID) | ORAL | Status: DC | PRN
Start: 2017-06-27 — End: 2017-06-30
  Filled 2017-06-27: qty 1

## 2017-06-27 MED ORDER — POLYVINYL ALCOHOL 1.4 % OP SOLN
1.0000 [drp] | Freq: Two times a day (BID) | OPHTHALMIC | Status: DC | PRN
  Filled 2017-06-27: qty 15

## 2017-06-27 MED ORDER — TIOTROPIUM BROMIDE MONOHYDRATE 18 MCG IN CAPS
18.0000 ug | ORAL_CAPSULE | Freq: Every day | RESPIRATORY_TRACT | Status: DC
  Administered 2017-06-27 – 2017-06-29 (×3): 18 ug via RESPIRATORY_TRACT
  Filled 2017-06-27: qty 5

## 2017-06-27 MED ORDER — FUROSEMIDE 40 MG PO TABS: 40.0000 mg | ORAL_TABLET | Freq: Every day | ORAL | Status: DC | PRN

## 2017-06-27 MED ORDER — ALPRAZOLAM 0.5 MG PO TABS
0.2500 mg | ORAL_TABLET | Freq: Two times a day (BID) | ORAL | Status: DC | PRN
  Administered 2017-06-28 – 2017-06-30 (×5): 0.25 mg via ORAL
  Filled 2017-06-27 (×5): qty 1

## 2017-06-27 MED ORDER — HYDROMORPHONE HCL 2 MG PO TABS
2.0000 mg | ORAL_TABLET | Freq: Four times a day (QID) | ORAL | Status: DC | PRN
  Administered 2017-06-28 – 2017-06-30 (×3): 2 mg via ORAL
  Filled 2017-06-27 (×3): qty 1

## 2017-06-27 MED ORDER — BUDESONIDE 0.25 MG/2ML IN SUSP
0.2500 mg | Freq: Two times a day (BID) | RESPIRATORY_TRACT | Status: DC
  Administered 2017-06-27 – 2017-06-29 (×4): 0.25 mg via RESPIRATORY_TRACT
  Filled 2017-06-27 (×4): qty 2

## 2017-06-27 MED ORDER — FAMOTIDINE 20 MG PO TABS
20.0000 mg | ORAL_TABLET | Freq: Two times a day (BID) | ORAL | Status: DC
  Administered 2017-06-27 – 2017-06-30 (×6): 20 mg via ORAL
  Filled 2017-06-27 (×7): qty 1

## 2017-06-27 NOTE — H&P (Signed)
[facility] [place] [facility]   PATIENT NAME: Sarah MedalJanet Diekmann    MR#:  [id number]  DATE OF BIRTH:  December 13, 1945  DATE OF ADMISSION:  06/27/2017  PRIMARY CARE PHYSICIAN: Patrice ParadiseMcLaughlin, Miriam K, MD   REQUESTING/REFERRING PHYSICIAN: Governor RooksLord Rebecca MD  CHIEF COMPLAINT:   Chief Complaint  Patient presents with  . Shortness of Breath  . Back Pain    HISTORY OF PRESENT ILLNESS: Sarah Mckee  is a 72 y.o. female with a known history of COPD and chronic respiratory failure on 2 L oxygen essential hypertension and pulmonary hypertension who presents to the emergency room with complaining of worsening shortness of breath and back pain.  Patient reports that she was recently diagnosed with presumptive of flu and was treated with prednisone Tamiflu.  Patient states that the flu symptoms got better however she started to complain of shortness of breath.  Patient also states that a few days ago she was trying to get out of the chair and strained her back and since then has had been having severe mid back pain.  Patient comes to the emergency room with shortness of breath and back pain she is noted to have  Severe thoracic spine fracture.  PAST MEDICAL HISTORY:   Past Medical History:  Diagnosis Date  . Arthritis   . COPD (chronic obstructive pulmonary disease) (HCC)    2L 02 at baseline  . Hypertension   . Pulmonary HTN (HCC)     PAST SURGICAL HISTORY:  Past Surgical History:  Procedure Laterality Date  . CATARACT EXTRACTION W/PHACO Left 04/28/2017   Procedure: CATARACT EXTRACTION PHACO AND INTRAOCULAR LENS PLACEMENT (IOC);  Surgeon: Galen ManilaPorfilio, William, MD;  Location: [facility] ORS;  Service: Ophthalmology;  Laterality: Left;  US 01:00.3 AP% 19.7 CDE 11.88 Fluid Pack lot # [id number] H  . none    . TUBAL LIGATION      SOCIAL HISTORY:  Social History   Tobacco Use  . Smoking status: Former Smoker    Last attempt to quit: 05/20/2007    Years since quitting: 10.1  . Smokeless tobacco:  Never Used  Substance Use Topics  . Alcohol use: No    FAMILY HISTORY:  Family History  Problem Relation Age of Onset  . Heart attack Mother   . Cancer Father   . Thyroid disease Father   . Diabetes Sister   . Diabetes Brother     DRUG ALLERGIES:  Allergies  Allergen Reactions  . Codeine Shortness Of Breath  . Demerol [Meperidine] Shortness Of Breath  . Morphine And Related Shortness Of Breath  . Oxycodone Shortness Of Breath  . Alendronate Other (See Comments)    Other reaction(s): Other (See Comments) esophagitis  . Amoxicillin-Pot Clavulanate Other (See Comments)    Unknown Has patient had a PCN reaction causing immediate rash, facial/tongue/throat swelling, SOB or lightheadedness with hypotension: Unknown Has patient had a PCN reaction causing severe rash involving mucus membranes or skin necrosis: Unknown Has patient had a PCN reaction that required hospitalization: Unknown Has patient had a PCN reaction occurring within the last 10 years: Unknown If all of the above answers are "NO", then may proceed with Cephalosporin use.   Marland Kitchen. Risedronate Other (See Comments)    Other reaction(s): Other (See Comments) epigastric pain  . Shellfish Allergy Nausea And Vomiting    REVIEW OF SYSTEMS:   CONSTITUTIONAL: No fever, fatigue or weakness.  EYES: No blurred or double vision.  EARS, NOSE, AND THROAT: No tinnitus or ear pain.  RESPIRATORY: Positive dry cough, positive shortness of breath, wheezing or hemoptysis.  CARDIOVASCULAR: No chest pain, orthopnea, edema.  GASTROINTESTINAL: No nausea, vomiting, diarrhea or abdominal pain.  GENITOURINARY: No dysuria, hematuria.  ENDOCRINE: No polyuria, nocturia,  HEMATOLOGY: No anemia, easy bruising or bleeding SKIN: No rash or lesion. MUSCULOSKELETAL: No joint pain or arthritis.  Positive back pain NEUROLOGIC: No tingling, numbness, weakness.  PSYCHIATRY: No anxiety or depression.   MEDICATIONS AT HOME:  Prior to Admission  medications   Medication Sig Start Date End Date Taking? Authorizing Provider  acetaminophen (TYLENOL) 500 MG tablet Take 500-1,000 mg by mouth See admin instructions. Take 500 mg by mouth in the morning/afternoon and take 1000 mg by mouth in the evening   Yes [provider]  albuterol (PROVENTIL) (2.5 MG/3ML) 0.083% nebulizer solution Take 3 mLs (2.5 mg total) by nebulization every 6 (six) hours as needed for wheezing or shortness of breath. 02/27/17  Yes Kasa, Wallis Bamberg, MD  ALPRAZolam Prudy Feeler) 0.25 MG tablet Take 0.25 mg by mouth 2 (two) times daily as needed for anxiety.  07/18/15  Yes [provider]  aluminum-magnesium hydroxide-simethicone (MAALOX) 200-200-20 MG/5ML SUSP Take 30 mLs by mouth 4 (four) times daily -  before meals and at bedtime. Patient taking differently: Take 30 mLs by mouth 4 (four) times daily as needed (for indegestion).  04/01/16  Yes Sharman Cheek, MD  ASPERCREME LIDOCAINE EX Apply 1 application topically as needed (for hand/shoulder pain).   Yes [provider]  BREO ELLIPTA 200-25 MCG/INH AEPB INHALE 1 PUFF BY MOUTH ONCE DAILY 05/21/17  Yes Erin Fulling, MD  furosemide (LASIX) 40 MG tablet Take 40 mg by mouth daily as needed for fluid.   Yes [provider]  montelukast (SINGULAIR) 10 MG tablet Take 10 mg by mouth daily. 08/08/15  Yes [provider]  OXYGEN Inhale into the lungs continuous.   Yes [provider]  Polyethyl Glycol-Propyl Glycol (SYSTANE OP) Place 1 drop into both eyes daily.   Yes [provider]  potassium chloride SA (K-DUR,KLOR-CON) 20 MEQ tablet Take 20 mEq by mouth daily.    Yes [provider]  PROAIR HFA 108 [phone] Base) MCG/ACT inhaler Inhale 2 puffs into the lungs every 4 (four) hours as needed for wheezing or shortness of breath.  06/13/15  Yes [provider]  ranitidine (ZANTAC) 150 MG capsule Take 1 capsule (150 mg total) by mouth 2 (two) times daily. Patient taking  differently: Take 150 mg by mouth daily.  04/01/16  Yes Sharman Cheek, MD  Tiotropium Bromide Monohydrate (SPIRIVA RESPIMAT) 2.5 MCG/ACT AERS Inhale 2 puffs into the lungs daily. 12/10/16 06/27/17 Yes Kasa, Wallis Bamberg, MD  triamterene-hydrochlorothiazide (MAXZIDE-25) 37.5-25 MG tablet Take 1 tablet by mouth daily. 08/06/15  Yes [provider]      PHYSICAL EXAMINATION:   VITAL SIGNS: Blood pressure 96/68, pulse (!) 106, temperature 98.1 F (36.7 C), temperature source Oral, resp. rate (!) 26, height 5\' 2"  (1.575 m), weight 180 lb (81.6 kg), SpO2 100 %.  GENERAL:  72 y.o.-year-old patient lying in the bed chronically ill-appearing.  EYES: Pupils equal, round, reactive to light and accommodation. No scleral icterus. Extraocular muscles intact.  HEENT: Head atraumatic, normocephalic. Oropharynx and nasopharynx clear.  NECK:  Supple, no jugular venous distention. No thyroid enlargement, no tenderness.  LUNGS: Diminished breath sounds bilaterally with some accessory muscle usage CARDIOVASCULAR: S1, S2 normal. No murmurs, rubs, or gallops.  ABDOMEN: Soft, nontender, nondistended. Bowel sounds present. No organomegaly or mass.  EXTREMITIES: 2+ edema cyanosis, or clubbing.  NEUROLOGIC: Cranial nerves II through XII are intact. Muscle strength 5/5 in all extremities. Sensation intact. Gait not checked.  PSYCHIATRIC: The patient is alert and oriented x 3.  SKIN: No obvious rash, lesion, or ulcer.   LABORATORY PANEL:   CBC Recent Labs  Lab 06/27/17 0814  WBC 7.7  HGB 14.2  HCT 42.5  PLT 190  MCV 91.6  MCH 30.5  MCHC 33.3  RDW 13.4  LYMPHSABS 1.1  MONOABS 0.6  EOSABS 0.1  BASOSABS 0.0   ------------------------------------------------------------------------------------------------------------------  Chemistries  Recent Labs  Lab 06/27/17 0814  NA 140  K 3.9  CL 96*  CO2 34*  GLUCOSE 136*  BUN 18  CREATININE 0.63  CALCIUM 9.0  AST 21  ALT 20  ALKPHOS 67  BILITOT  0.9   ------------------------------------------------------------------------------------------------------------------ estimated creatinine clearance is 64.8 mL/min (by C-G formula based on SCr of 0.63 mg/dL). ------------------------------------------------------------------------------------------------------------------ No results for input(s): TSH, T4TOTAL, T3FREE, THYROIDAB in the last 72 hours.  Invalid input(s): FREET3   Coagulation profile No results for input(s): INR, PROTIME in the last 168 hours. ------------------------------------------------------------------------------------------------------------------- No results for input(s): DDIMER in the last 72 hours. -------------------------------------------------------------------------------------------------------------------  Cardiac Enzymes Recent Labs  Lab 06/27/17 0814  TROPONINI <0.03   ------------------------------------------------------------------------------------------------------------------ Invalid input(s): POCBNP  ---------------------------------------------------------------------------------------------------------------  Urinalysis No results found for: COLORURINE, APPEARANCEUR, LABSPEC, PHURINE, GLUCOSEU, HGBUR, BILIRUBINUR, KETONESUR, PROTEINUR, UROBILINOGEN, NITRITE, LEUKOCYTESUR   RADIOLOGY: Dg Chest 2 View  Result Date: 06/27/2017 CLINICAL DATA:  Shortness of breath. EXAM: CHEST  2 VIEW COMPARISON:  CT chest dated July 17, 2016. Chest x-ray dated April 01, 2016. FINDINGS: The heart size and mediastinal contours are within normal limits. Normal pulmonary vascularity. Atherosclerotic calcification of the aortic arch. Bibasilar interstitial prominence, similar to prior study. No focal consolidation, pleural effusion, or pneumothorax. Slightly progressed height loss of the T7 vertebral body. Unchanged moderate T3 compression deformity and mild T11 compression deformity. IMPRESSION: 1.  No active  cardiopulmonary disease. 2. Progressive height loss of the T7 vertebral body, now severe. Correlate with point tenderness. Electronically Signed   By: Obie Dredge M.D.   On: 06/27/2017 08:45    EKG: Orders placed or performed during the hospital encounter of 06/27/17  . ED EKG  . ED EKG  . EKG 12-Lead  . EKG 12-Lead    IMPRESSION AND PLAN: Patient is a 72 year old with COPD chronic respiratory failure presenting with worsening shortness of breath  1.  Acute on chronic respiratory failure due to acute on chronic COPD exasperation I will treat patient with Solu-Medrol nebulizer therapy and Pulmicort Add Mucinex to current regimen Continue oxygen therapy  2.  Severe back pain with thoracic fracture at T7 level I will place patient on Toradol every 6 hours schedule IV pain meds Patient also will be on Solu-Medrol for COPD that should help with the back pain I will ask or to see to see if she is a candidate for any type of intervention Patient may be have be able to have vertebroplasty once COPD is improved  3.  Essential hypertension Continue therapy with Maxide  4.  Chronic lymphedema I will have the nurses place a TED hose Continue Lasix as taking at home  5.  Miscellaneous we will do heparin for possible surgery for DVT prophylaxis     All the records are reviewed and case discussed with ED provider. Management plans discussed with the patient, family and they are in agreement.  CODE STATUS: Code Status History  Date Active Date Inactive Code Status Order ID Comments User Context   08/11/2015 11:30 08/16/2015 17:12 Full Code [id number]  [name], MD ED       TOTAL TIME TAKING CARE OF THIS PATIENT: 55 minutes.    [name] M.D on 06/27/2017 at 10:44 AM  Between 7am to 6pm - Pager - [phone]  After 6pm go to [email or web address] - password [email or web address] [facility]  [facility]  Office  [phone]  CC: Primary care physician; Patrice Paradise,  MD

## 2017-06-27 NOTE — ED Triage Notes (Signed)
Pt to ED via [facility] from home for shortness of breath and back pain. Pt hurt her back when getting up from the table on Wednesday, has been having worsening shortness of breath since that time. Per EMS when FD first arrived pt O2 sat was 86% on 2 liter via [place], pt was put on non-rebreather with improvement in her sats to 98%.  Pt also reports swelling in the bilateral lower extremities. Pt reports that she has been unable to wear her compression hose since she hurt her back on Wednesday. Pt was also diagnosed with flue last week. EMS reports wheezing and rhonchi in the lungs, pt was given 1 duoneb treatment in route.

## 2017-06-27 NOTE — ED Provider Notes (Addendum)
[facility] Emergency Department Provider Note ____________________________________________   I have reviewed the triage vital signs and the triage nursing note.  HISTORY  Chief Complaint Shortness of Breath and Back Pain   Historian Patient  HPI Sarah Mckee is a 72 y.o. female with COPD, on 2 L home O2 at baseline, also pulmonary hypertension, lymphedema but without history of congestive heart failure, presents to the emergency department for worsening shortness of breath over the last 24 hours.  Last week she was having some shortness of breath and was treated presumptively with a course of prednisone, Tamiflu due to being exposed to someone with the flu, as well as azithromycin.  She completed those medications about 1 week ago on last Sunday.  Symptoms are moderate to severe this morning.  She is feeling somewhat better after DuoNeb treatment by EMS.   Past Medical History:  Diagnosis Date  . Arthritis   . COPD (chronic obstructive pulmonary disease) (HCC)    2L 02 at baseline  . Hypertension   . Pulmonary HTN [facility](HCC)     Patient Active Problem List   Diagnosis Date Noted  . Solitary pulmonary nodule-LLL 04/17/2016  . COPD exacerbation (HCC) 11/06/2015  . Chronic respiratory failure (HCC) 10/03/2015  . COPD with hypoxia (HCC) 10/03/2015  . Acute respiratory failure (HCC) 08/11/2015    Past Surgical History:  Procedure Laterality Date  . CATARACT EXTRACTION W/PHACO Left 04/28/2017   Procedure: CATARACT EXTRACTION PHACO AND INTRAOCULAR LENS PLACEMENT (IOC);  Surgeon: Galen ManilaPorfilio, William, MD;  Location: [facility] ORS;  Service: Ophthalmology;  Laterality: Left;  US 01:00.3 AP% 19.7 CDE 11.88 Fluid Pack lot # [id number] H  . none    . TUBAL LIGATION      Prior to Admission medications   Medication Sig Start Date End Date Taking? Authorizing Provider  acetaminophen (TYLENOL) 500 MG tablet Take 500-1,000 mg by mouth See admin instructions. Take 500 mg by  mouth in the morning/afternoon and take 1000 mg by mouth in the evening    [provider]  albuterol (PROVENTIL) (2.5 MG/3ML) 0.083% nebulizer solution Take 3 mLs (2.5 mg total) by nebulization every 6 (six) hours as needed for wheezing or shortness of breath. 02/27/17   Erin FullingKasa, Kurian, MD  ALPRAZolam Prudy Feeler(XANAX) 0.25 MG tablet Take 0.25 mg by mouth 2 (two) times daily as needed for anxiety.  07/18/15   [provider]  aluminum-magnesium hydroxide-simethicone (MAALOX) 200-200-20 MG/5ML SUSP Take 30 mLs by mouth 4 (four) times daily -  before meals and at bedtime. Patient taking differently: Take 30 mLs by mouth 4 (four) times daily as needed (for indegestion).  04/01/16   Sharman CheekStafford, Phillip, MD  ASPERCREME LIDOCAINE EX Apply 1 application topically as needed (for hand/shoulder pain).    [provider]  BREO ELLIPTA 200-25 MCG/INH AEPB INHALE 1 PUFF BY MOUTH ONCE DAILY 05/21/17   Erin FullingKasa, Kurian, MD  furosemide (LASIX) 40 MG tablet Take 40 mg by mouth daily as needed for fluid.    [provider]  montelukast (SINGULAIR) 10 MG tablet Take 10 mg by mouth daily. 08/08/15   [provider]  OXYGEN Inhale into the lungs continuous.    [provider]  Polyethyl Glycol-Propyl Glycol (SYSTANE OP) Place 1 drop into both eyes daily.    [provider]  potassium chloride SA (K-DUR,KLOR-CON) 20 MEQ tablet Take 20 mEq by mouth daily.     [provider]  PROAIR HFA 108 [phone](90 Base) MCG/ACT inhaler Inhale 2 puffs into the  lungs every 4 (four) hours as needed for wheezing or shortness of breath.  06/13/15   [provider]  ranitidine (ZANTAC) 150 MG capsule Take 1 capsule (150 mg total) by mouth 2 (two) times daily. Patient taking differently: Take 150 mg by mouth daily.  04/01/16   Sharman Cheek, MD  Tiotropium Bromide Monohydrate (SPIRIVA RESPIMAT) 2.5 MCG/ACT AERS Inhale 2 puffs into the lungs daily. 12/10/16 04/21/17  Erin Fulling, MD   triamterene-hydrochlorothiazide (MAXZIDE-25) 37.5-25 MG tablet Take 1 tablet by mouth daily. 08/06/15   [provider]    Allergies  Allergen Reactions  . Codeine Shortness Of Breath  . Demerol [Meperidine] Shortness Of Breath  . Morphine And Related Shortness Of Breath  . Oxycodone Shortness Of Breath  . Alendronate Other (See Comments)    Other reaction(s): Other (See Comments) esophagitis  . Amoxicillin-Pot Clavulanate Other (See Comments)    Unknown Has patient had a PCN reaction causing immediate rash, facial/tongue/throat swelling, SOB or lightheadedness with hypotension: Unknown Has patient had a PCN reaction causing severe rash involving mucus membranes or skin necrosis: Unknown Has patient had a PCN reaction that required hospitalization: Unknown Has patient had a PCN reaction occurring within the last 10 years: Unknown If all of the above answers are "NO", then may proceed with Cephalosporin use.   Marland Kitchen Risedronate Other (See Comments)    Other reaction(s): Other (See Comments) epigastric pain  . Shellfish Allergy Nausea And Vomiting    Family History  Problem Relation Age of Onset  . Heart attack Mother   . Cancer Father   . Thyroid disease Father   . Diabetes Sister   . Diabetes Brother     Social History Social History   Tobacco Use  . Smoking status: Former Smoker    Last attempt to quit: 05/20/2007    Years since quitting: 10.1  . Smokeless tobacco: Never Used  Substance Use Topics  . Alcohol use: No  . Drug use: No    Review of Systems  Constitutional: Negative for fever. Eyes: Negative for visual changes. ENT: Negative for sore throat. Cardiovascular: Negative for chest pain. Respiratory: Positive for shortness of breath. Gastrointestinal: Negative for abdominal pain, vomiting and diarrhea. Genitourinary: Negative for dysuria. Musculoskeletal: Negative for back pain. Skin: Negative for rash. Neurological: Negative for  headache.  ____________________________________________   PHYSICAL EXAM:  VITAL SIGNS: ED Triage Vitals  Enc Vitals Group     BP 06/27/17 0809 (!) 157/78     Pulse Rate 06/27/17 0809 (!) 109     Resp 06/27/17 0809 (!) 26     Temp 06/27/17 0809 98.1 F (36.7 C)     Temp Source 06/27/17 0809 Oral     SpO2 06/27/17 0809 97 %     Weight 06/27/17 0807 180 lb (81.6 kg)     Height 06/27/17 0807 5\' 2"  (1.575 m)     Head Circumference --      Peak Flow --      Pain Score 06/27/17 0806 10     Pain Loc --      Pain Edu? --      Excl. in GC? --      Constitutional: Alert and oriented.  Deliberate breaths for talking, somewhat short of breath with conversation and audible ronchi. HEENT   Head: Normocephalic and atraumatic.      Eyes: Conjunctivae are normal. Pupils equal and round.       Ears:         Nose:  No congestion/rhinnorhea.   Mouth/Throat: Mucous membranes are moist.   Neck: No stridor. Cardiovascular/Chest: Tachycardic rate, regular rhythm.  No murmurs, rubs, or gallops. Respiratory: No retractions.  Significant ronchi in all fields, moderate decreased breath sounds with wheezing.  Trouble speaking full sentence. Gastrointestinal: Soft. No distention, no guarding, no rebound. Nontender.    Genitourinary/rectal:Deferred Musculoskeletal: Nontender with normal range of motion in all extremities. No joint effusions.  No lower extremity tenderness.  3+ LE pitting edema bilaterally. Neurologic:  Normal speech and language. No gross or focal neurologic deficits are appreciated. Skin:  Skin is warm, dry and intact. No rash noted. Psychiatric: Mood and affect are normal. Speech and behavior are normal. Patient exhibits appropriate insight and judgment.   ____________________________________________  LABS (pertinent positives/negatives) I, Governor Rooks, MD the attending physician have reviewed the labs noted below.  Labs Reviewed  COMPREHENSIVE METABOLIC PANEL -  Abnormal; Notable for the following components:      Result Value   Chloride 96 (*)    CO2 34 (*)    Glucose, Bld 136 (*)    All other components within normal limits  CBC WITH DIFFERENTIAL/PLATELET  BRAIN NATRIURETIC PEPTIDE  TROPONIN I    ____________________________________________    EKG I, Governor Rooks, MD, the attending physician have personally viewed and interpreted all ECGs.  109 bpm.  Sinus tachycardia.  Narrow QRS.  Normal axis.  Occasional PVC.  Normal ST and T wave ____________________________________________  RADIOLOGY All Xrays were viewed by me.  Imaging interpreted by Radiologist, and I, Governor Rooks, MD the attending physician have reviewed the radiologist interpretation noted below.  Chest x-ray two-view: No focal infiltrate  Radiologist interpretation:  FINDINGS: The heart size and mediastinal contours are within normal limits. Normal pulmonary vascularity. Atherosclerotic calcification of the aortic arch. Bibasilar interstitial prominence, similar to prior study. No focal consolidation, pleural effusion, or pneumothorax. Slightly progressed height loss of the T7 vertebral body. Unchanged moderate T3 compression deformity and mild T11 compression deformity.  IMPRESSION: 1.  No active cardiopulmonary disease. 2. Progressive height loss of the T7 vertebral body, now severe. Correlate with point tenderness. __________________________________________  PROCEDURES  Procedure(s) performed: None  Critical Care performed: None   ____________________________________________  ED COURSE / ASSESSMENT AND PLAN  Pertinent labs & imaging results that were available during my care of the patient were reviewed by me and considered in my medical decision making (see chart for details).  Patient with significant dyspnea and rhonchi and wheezing and decreased air movement.  Chest x-ray without focal pneumonia.  Normal white blood cell count.  We will treat for  COPD exacerbation.  The patient is significantly dyspneic, and will need hospitalization.  She is essentially failed outpatient management as she was on a course of treatment for COPD exacerbation less than a week ago.  Multiple vertebral compression fractures, do not seem to be giving her acute symptoms.  DIFFERENTIAL DIAGNOSIS: Differential includes, but is not limited to, viral syndrome, bronchitis including COPD exacerbation, pneumonia, reactive airway disease including asthma, CHF including exacerbation with or without pulmonary/interstitial edema, pneumothorax, ACS, thoracic trauma, and pulmonary embolism.  CONSULTATIONS:  Hospitalist for admission.  Patient / Family / Caregiver informed of clinical course, medical decision-making process, and agree with plan.   Addended to include when I went back to talk to the patient about the findings with the compression fractures, she states that she did know about the T7 compression fracture but she is having continued persistent pain with some wrapping around the right  side of the thoracic area, so I do suspect she is having some component of acute pain related to that worsening of the known compression fracture.  She states that only Dilaudid will work for her.  I will start really low due to possible respiratory side effect. ___________________________________________   FINAL CLINICAL IMPRESSION(S) / ED DIAGNOSES   Final diagnoses:  COPD exacerbation (HCC)  Closed wedge compression fracture of seventh thoracic vertebra, initial encounter (HCC)      ___________________________________________        Note: This dictation was prepared with Dragon dictation. Any transcriptional errors that result from this process are unintentional    Governor Rooks, MD 06/27/17 1610    Governor Rooks, MD 06/27/17 [phone]

## 2017-06-27 NOTE — ED Notes (Signed)
Pt returned from x-ray, Dr. Shaune PollackLord at bedside

## 2017-06-28 LAB — BASIC METABOLIC PANEL
Anion gap: 11 (ref 5–15)
BUN: 19 mg/dL (ref 6–20)
CO2: 32 mmol/L (ref 22–32)
Calcium: 8.9 mg/dL (ref 8.9–10.3)
Chloride: 96 mmol/L — ABNORMAL LOW (ref 101–111)
Creatinine, Ser: 0.65 mg/dL (ref 0.44–1.00)
GFR calc Af Amer: >60 mL/min (ref >60)
GLUCOSE: 148 mg/dL — AB (ref 65–99)
POTASSIUM: 4 mmol/L (ref 3.5–5.1)
Sodium: 139 mmol/L (ref 135–145)

## 2017-06-28 LAB — CBC
HEMATOCRIT: 38.2 % (ref 35.0–47.0)
Hemoglobin: 12.9 g/dL (ref 12.0–16.0)
MCH: 30.6 pg (ref 26.0–34.0)
MCHC: 33.8 g/dL (ref 32.0–36.0)
MCV: 90.7 fL (ref 80.0–100.0)
PLATELETS: 181 10*3/uL (ref 150–440)
RBC: 4.21 MIL/uL (ref 3.80–5.20)
RDW: 13.8 % (ref 11.5–14.5)
WBC: 4.8 10*3/uL (ref 3.6–11.0)

## 2017-06-28 LAB — INFLUENZA PANEL BY PCR (TYPE A & B)
Influenza A By PCR: NEGATIVE
Influenza B By PCR: NEGATIVE

## 2017-06-28 MED ORDER — IPRATROPIUM-ALBUTEROL 0.5-2.5 (3) MG/3ML IN SOLN
3.0000 mL | Freq: Three times a day (TID) | RESPIRATORY_TRACT | Status: DC
  Administered 2017-06-28 – 2017-06-29 (×3): 3 mL via RESPIRATORY_TRACT
  Filled 2017-06-28 (×3): qty 3

## 2017-06-28 NOTE — Progress Notes (Signed)
[facility] - [place] [facility]                                                                                                                                                                                  Patient Demographics   Sarah Mckee, is a 72 y.o. female, DOB - Nov 09, 1945, [id number]  Admit date - 06/27/2017   Admitting Physician Auburn BilberryShreyang Eriona Kinchen, MD  Outpatient Primary MD for the patient is Patrice ParadiseMcLaughlin, Miriam K, MD   LOS - 1  Subjective:  pt feeing better, sob improved, back pain improved,  Still coughing    Review of Systems:   CONSTITUTIONAL: No documented fever. No fatigue, weakness. No weight gain, no weight loss.  EYES: No blurry or double vision.  ENT: No tinnitus. No postnasal drip. No redness of the oropharynx.  RESPIRATORY: positive cough, no wheeze, no hemoptysis. postive dyspnea.  CARDIOVASCULAR: No chest pain. No orthopnea. No palpitations. No syncope.  GASTROINTESTINAL: No nausea, no vomiting or diarrhea. No abdominal pain. No melena or hematochezia.  GENITOURINARY: No dysuria or hematuria.  ENDOCRINE: No polyuria or nocturia. No heat or cold intolerance.  HEMATOLOGY: No anemia. No bruising. No bleeding.  INTEGUMENTARY: No rashes. No lesions.  MUSCULOSKELETAL: No arthritis. No swelling. No gout. Back pain improved NEUROLOGIC: No numbness, tingling, or ataxia. No seizure-type activity.  PSYCHIATRIC: No anxiety. No insomnia. No ADD.    Vitals:   Vitals:   06/27/17 1950 06/27/17 2009 06/28/17 0435 06/28/17 0805  BP:  (!) 155/62 (!) 141/64 (!) 132/56  Pulse: 93 (!) 101 97 (!) 103  Resp: 16 20 20    Temp:  97.8 F (36.6 C) 98.2 F (36.8 C) 98.1 F (36.7 C)  TempSrc:  Oral Oral Oral  SpO2: 95% 97% 94% 94%  Weight:      Height:        Wt Readings from Last 3 Encounters:  06/27/17 180 lb (81.6 kg)  04/28/17 180 lb (81.6 kg)  02/03/17 184 lb (83.5 kg)     Intake/Output Summary (Last 24 hours) at 06/28/2017 0957 Last data  filed at 06/28/2017 0436 Gross per 24 hour  Intake -  Output 480 ml  Net -480 ml    Physical Exam:   GENERAL: Pleasant-appearing in no apparent distress.  HEAD, EYES, EARS, NOSE AND THROAT: Atraumatic, normocephalic. Extraocular muscles are intact. Pupils equal and reactive to light. Sclerae anicteric. No conjunctival injection. No oro-pharyngeal erythema.  NECK: Supple. There is no jugular venous distention. No bruits, no lymphadenopathy, no thyromegaly.  HEART: Regular rate and rhythm,. No murmurs, no rubs, no clicks.  LUNGS: wheezing bilaterally, no accessory muscle use ABDOMEN: Soft, flat, nontender, nondistended. Has good  bowel sounds. No hepatosplenomegaly appreciated.  EXTREMITIES: No evidence of any cyanosis, clubbing, or peripheral edema.  +2 pedal and radial pulses bilaterally.  NEUROLOGIC: The patient is alert, awake, and oriented x3 with no focal motor or sensory deficits appreciated bilaterally.  SKIN: Moist and warm with no rashes appreciated.  Psych: Not anxious, depressed LN: No inguinal LN enlargement    Antibiotics   Anti-infectives (From admission, onward)   None      Medications   Scheduled Meds: . budesonide (PULMICORT) nebulizer solution  0.25 mg Nebulization BID  . famotidine  20 mg Oral BID  . guaiFENesin  600 mg Oral BID  . heparin  5,000 Units Subcutaneous Q8H  . ipratropium-albuterol  3 mL Nebulization QID  . ketorolac  30 mg Intravenous Q6H  . methylPREDNISolone (SOLU-MEDROL) injection  60 mg Intravenous Q6H  . montelukast  10 mg Oral Daily  . sodium chloride flush  3 mL Intravenous Q12H  . tiotropium  18 mcg Inhalation Daily  . triamterene-hydrochlorothiazide  1 tablet Oral Daily   Continuous Infusions: . sodium chloride     PRN Meds:.sodium chloride, acetaminophen **OR** acetaminophen, ALPRAZolam, furosemide, HYDROmorphone, ipratropium-albuterol, ondansetron **OR** ondansetron (ZOFRAN) IV, polyvinyl alcohol, sodium chloride flush   Data  Review:   Micro Results No results found for this or any previous visit (from the past 240 hour(s)).  Radiology Reports Dg Chest 2 View  Result Date: 06/27/2017 CLINICAL DATA:  Shortness of breath. EXAM: CHEST  2 VIEW COMPARISON:  CT chest dated July 17, 2016. Chest x-ray dated April 01, 2016. FINDINGS: The heart size and mediastinal contours are within normal limits. Normal pulmonary vascularity. Atherosclerotic calcification of the aortic arch. Bibasilar interstitial prominence, similar to prior study. No focal consolidation, pleural effusion, or pneumothorax. Slightly progressed height loss of the T7 vertebral body. Unchanged moderate T3 compression deformity and mild T11 compression deformity. IMPRESSION: 1.  No active cardiopulmonary disease. 2. Progressive height loss of the T7 vertebral body, now severe. Correlate with point tenderness. Electronically Signed   By: Obie Dredge M.D.   On: 06/27/2017 08:45     CBC Recent Labs  Lab 06/27/17 0814 06/28/17 0422  WBC 7.7 4.8  HGB 14.2 12.9  HCT 42.5 38.2  PLT 190 181  MCV 91.6 90.7  MCH 30.5 30.6  MCHC 33.3 33.8  RDW 13.4 13.8  LYMPHSABS 1.1  --   MONOABS 0.6  --   EOSABS 0.1  --   BASOSABS 0.0  --     Chemistries  Recent Labs  Lab 06/27/17 0814 06/28/17 0422  NA 140 139  K 3.9 4.0  CL 96* 96*  CO2 34* 32  GLUCOSE 136* 148*  BUN 18 19  CREATININE 0.63 0.65  CALCIUM 9.0 8.9  AST 21  --   ALT 20  --   ALKPHOS 67  --   BILITOT 0.9  --    ------------------------------------------------------------------------------------------------------------------ estimated creatinine clearance is 64.8 mL/min (by C-G formula based on SCr of 0.65 mg/dL). ------------------------------------------------------------------------------------------------------------------ No results for input(s): HGBA1C in the last 72  hours. ------------------------------------------------------------------------------------------------------------------ No results for input(s): CHOL, HDL, LDLCALC, TRIG, CHOLHDL, LDLDIRECT in the last 72 hours. ------------------------------------------------------------------------------------------------------------------ No results for input(s): TSH, T4TOTAL, T3FREE, THYROIDAB in the last 72 hours.  Invalid input(s): FREET3 ------------------------------------------------------------------------------------------------------------------ No results for input(s): VITAMINB12, FOLATE, FERRITIN, TIBC, IRON, RETICCTPCT in the last 72 hours.  Coagulation profile No results for input(s): INR, PROTIME in the last 168 hours.  No results for input(s): DDIMER in the last 72 hours.  Cardiac Enzymes Recent Labs  Lab 06/27/17 0814  TROPONINI <0.03   ------------------------------------------------------------------------------------------------------------------ Invalid input(s): POCBNP    Assessment & Plan   Patient is a 72 year old with COPD chronic respiratory failure presenting with worsening shortness of breath  1.  Acute on chronic respiratory failure due to acute on chronic COPD exasperation: improved continue Solu-Medrol nebulizer therapy and Pulmicort continue Mucinex to current regimen Continue oxygen therapy  2.  Severe back pain with thoracic fracture at T7 level Continue toradol for 6 doses Continue IV pain meds prn Patient also will be on Solu-Medrol for COPD that should help with the back pain Ortho eval pending Patient may be have be able to have vertebroplasty once COPD is improved Pt eval  3.  Essential hypertension Continue therapy with Maxide  4.  Chronic lymphedema ted hose Continue Lasix as taking at home  5.  Miscellaneous we will do heparin for possible surgery for DVT prophylaxis          Code Status Orders  (From admission, onward)         Start     Ordered   06/27/17 1303  Full code  Continuous     06/27/17 1302    Code Status History    Date Active Date Inactive Code Status Order ID Comments User Context   08/11/2015 11:30 08/16/2015 17:12 Full Code [id number]  [name], MD ED    Advance Directive Documentation     Most Recent Value  Type of Advance Directive  Healthcare Power of Attorney  Pre-existing out of facility DNR order (yellow form or pink MOST form)  No data  "MOST" Form in Place?  No data           Consults  ortho  DVT Prophylaxis  heparin  Lab Results  Component Value Date   PLT 181 06/28/2017     Time Spent in minutes   Greater than 50% of time spent in care coordination and counseling patient regarding the condition and plan of care.   [name] M.D on 06/28/2017 at 9:57 AM  Between 7am to 6pm - Pager - [phone]  After 6pm go to [email or web address] - password [email or web address]  [facility]   Office  [phone]

## 2017-06-28 NOTE — Consult Note (Signed)
ORTHOPAEDIC CONSULTATION  REQUESTING PHYSICIAN: [name], MD  Chief Complaint:   Mid back pain.  History of Present Illness: Sarah Mckee is a 72 y.o. female with a history of COPD, hypertension, pulmonary hypertension, and osteoporosis who lives independently.  She was in her usual state of health until Wednesday when she felt a "pull" in her back while getting out of her vehicle.  Initially, she is called her primary care provider who prescribed her some Flexeril and advised her to take ibuprofen.  Because of continued discomfort and also because of increased breathing difficulties, she presented to the emergency room on Friday and was admitted for treatment of her acute on chronic COPD exacerbation.  Her admission chest x-ray suggested an increased T7 compression fracture, so orthopedics has been consulted.  Past Medical History:  Diagnosis Date  . Arthritis   . COPD (chronic obstructive pulmonary disease) (HCC)    2L 02 at baseline  . Hypertension   . Pulmonary HTN (HCC)    Past Surgical History:  Procedure Laterality Date  . CATARACT EXTRACTION W/PHACO Left 04/28/2017   Procedure: CATARACT EXTRACTION PHACO AND INTRAOCULAR LENS PLACEMENT (IOC);  Surgeon: Galen Manila, MD;  Location: [facility] ORS;  Service: Ophthalmology;  Laterality: Left;  [place] 01:00.3 AP% 19.7 CDE 11.88 Fluid Pack lot # [id number]  . none    . TUBAL LIGATION     Social History   Socioeconomic History  . Marital status: Married    Spouse name: None  . Number of children: None  . Years of education: None  . Highest education level: None  Social Needs  . Financial resource strain: None  . Food insecurity - worry: None  . Food insecurity - inability: None  . Transportation needs - medical: None  . Transportation needs - non-medical: None  Occupational History  . None  Tobacco Use  . Smoking status: Former Smoker    Last attempt to  quit: 05/20/2007    Years since quitting: 10.1  . Smokeless tobacco: Never Used  Substance and Sexual Activity  . Alcohol use: No  . Drug use: No  . Sexual activity: None  Other Topics Concern  . None  Social History Narrative  . None   Family History  Problem Relation Age of Onset  . Heart attack Mother   . Cancer Father   . Thyroid disease Father   . Diabetes Sister   . Diabetes Brother    Allergies  Allergen Reactions  . Codeine Shortness Of Breath  . Demerol [Meperidine] Shortness Of Breath  . Morphine And Related Shortness Of Breath  . Oxycodone Shortness Of Breath  . Alendronate Other (See Comments)    Other reaction(s): Other (See Comments) esophagitis  . Amoxicillin-Pot Clavulanate Other (See Comments)    Unknown Has patient had a PCN reaction causing immediate rash, facial/tongue/throat swelling, SOB or lightheadedness with hypotension: Unknown Has patient had a PCN reaction causing severe rash involving mucus membranes or skin necrosis: Unknown Has patient had a PCN reaction that required hospitalization: Unknown Has patient had a PCN reaction occurring within the last 10 years: Unknown If all of the above answers are "NO", then may proceed with Cephalosporin use.   Marland Kitchen Risedronate Other (See Comments)    Other reaction(s): Other (See Comments) epigastric pain  . Shellfish Allergy Nausea And Vomiting   Prior to Admission medications   Medication Sig Start Date End Date Taking? Authorizing Provider  acetaminophen (TYLENOL) 500 MG tablet Take 500-1,000 mg by mouth See admin  instructions. Take 500 mg by mouth in the morning/afternoon and take 1000 mg by mouth in the evening   Yes [provider]  albuterol (PROVENTIL) (2.5 MG/3ML) 0.083% nebulizer solution Take 3 mLs (2.5 mg total) by nebulization every 6 (six) hours as needed for wheezing or shortness of breath. 02/27/17  Yes Kasa, Wallis Bamberg, MD  ALPRAZolam Prudy Feeler) 0.25 MG tablet Take 0.25 mg by mouth 2 (two)  times daily as needed for anxiety.  07/18/15  Yes [provider]  aluminum-magnesium hydroxide-simethicone (MAALOX) 200-200-20 MG/5ML SUSP Take 30 mLs by mouth 4 (four) times daily -  before meals and at bedtime. Patient taking differently: Take 30 mLs by mouth 4 (four) times daily as needed (for indegestion).  04/01/16  Yes Sharman Cheek, MD  ASPERCREME LIDOCAINE EX Apply 1 application topically as needed (for hand/shoulder pain).   Yes [provider]  BREO ELLIPTA 200-25 MCG/INH AEPB INHALE 1 PUFF BY MOUTH ONCE DAILY 05/21/17  Yes Erin Fulling, MD  furosemide (LASIX) 40 MG tablet Take 40 mg by mouth daily as needed for fluid.   Yes [provider]  montelukast (SINGULAIR) 10 MG tablet Take 10 mg by mouth daily. 08/08/15  Yes [provider]  OXYGEN Inhale into the lungs continuous.   Yes [provider]  Polyethyl Glycol-Propyl Glycol (SYSTANE OP) Place 1 drop into both eyes daily.   Yes [provider]  potassium chloride SA (K-DUR,KLOR-CON) 20 MEQ tablet Take 20 mEq by mouth daily.    Yes [provider]  PROAIR HFA 108 [phone] Base) MCG/ACT inhaler Inhale 2 puffs into the lungs every 4 (four) hours as needed for wheezing or shortness of breath.  06/13/15  Yes [provider]  ranitidine (ZANTAC) 150 MG capsule Take 1 capsule (150 mg total) by mouth 2 (two) times daily. Patient taking differently: Take 150 mg by mouth daily.  04/01/16  Yes Sharman Cheek, MD  Tiotropium Bromide Monohydrate (SPIRIVA RESPIMAT) 2.5 MCG/ACT AERS Inhale 2 puffs into the lungs daily. 12/10/16 06/27/17 Yes Kasa, Wallis Bamberg, MD  triamterene-hydrochlorothiazide (MAXZIDE-25) 37.5-25 MG tablet Take 1 tablet by mouth daily. 08/06/15  Yes [provider]   Dg Chest 2 View  Result Date: 06/27/2017 CLINICAL DATA:  Shortness of breath. EXAM: CHEST  2 VIEW COMPARISON:  CT chest dated July 17, 2016. Chest x-ray dated April 01, 2016. FINDINGS: The heart  size and mediastinal contours are within normal limits. Normal pulmonary vascularity. Atherosclerotic calcification of the aortic arch. Bibasilar interstitial prominence, similar to prior study. No focal consolidation, pleural effusion, or pneumothorax. Slightly progressed height loss of the T7 vertebral body. Unchanged moderate T3 compression deformity and mild T11 compression deformity. IMPRESSION: 1.  No active cardiopulmonary disease. 2. Progressive height loss of the T7 vertebral body, now severe. Correlate with point tenderness. Electronically Signed   By: Obie Dredge M.D.   On: 06/27/2017 08:45    Positive ROS: All other systems have been reviewed and were otherwise negative with the exception of those mentioned in the HPI and as above.  Physical Exam: General:  Alert, no acute distress Psychiatric:  Patient is competent for consent with normal mood and affect   Cardiovascular:  No pedal edema Respiratory:  No wheezing, minimally labored breathing GI:  Abdomen is soft and non-tender Skin:  No lesions in the area of chief complaint Neurologic:  Sensation intact distally Lymphatic:  No axillary or cervical lymphadenopathy  Orthopedic Exam:  Orthopedic examination is limited to the thoracolumbar spine.  Skin inspection  of the back is unremarkable.  There is no swelling, erythema, ecchymosis, abrasions, or other abnormalities identified.  She has a slightly increased thoracic kyphosis.  She has no tenderness to palpation along the thoracic or lumbar spine.  She is neurovascularly intact to both lower extremities and feet.  X-rays:  Recent x-rays of her chest are available for review.  These films demonstrate slightly progressed height loss of the T7 vertebral body, as well as unchanged moderate T3 and mild T11 compression deformities.  Diffuse osteopenia is noted throughout the thoracic and lumbar spine.  No lytic lesions or other acute bony abnormalities are  identified.  Assessment: Increased T7 compression fracture of indeterminate age.  Plan: The treatment options have been discussed with the patient.  At this point, the patient feels that her symptoms are quite manageable and she does not wish to consider any type of additional procedure, specifically a kyphoplasty, especially given her pulmonary status.  I feel that it is reasonable to begin mobilizing the patient with physical therapy when she is stabilized medically.  If her symptoms do worsen, then I will be happy to reevaluate her to see if she has changed her mind about the kyphoplasty option.  She may receive appropriate pain medication.  Thank you for asked me to participate in the care of this most pleasant woman.  Please reconsult me if her symptoms worsen with mobilization.  Otherwise, the patient may follow-up with Dr. Rosita KeaMenz on an as necessary basis as she already is a patient of his.   Maryagnes AmosJ. Jeffrey Jaggar Benko, MD  Beeper #:  [phone]) [phone]  06/28/2017 1:39 PM

## 2017-06-28 NOTE — Clinical Social Work Note (Signed)
CSW received consult that patient is having difficulty with medications due to financial barriers. Please consult RNCM as this is the role of that department. CSW is signing off. Please consult should needs arise.  Argentina PonderKaren Martha Saivon Prowse, MSW, Theresia MajorsLCSWA [phone]

## 2017-06-29 ENCOUNTER — Inpatient Hospital Stay: Payer: PPO

## 2017-06-29 MED ORDER — KETOROLAC TROMETHAMINE 30 MG/ML IJ SOLN
30.0000 mg | Freq: Four times a day (QID) | INTRAMUSCULAR | Status: DC | PRN
  Administered 2017-06-29 (×2): 30 mg via INTRAVENOUS
  Filled 2017-06-29 (×2): qty 1

## 2017-06-29 MED ORDER — IPRATROPIUM-ALBUTEROL 0.5-2.5 (3) MG/3ML IN SOLN
3.0000 mL | Freq: Four times a day (QID) | RESPIRATORY_TRACT | Status: DC
  Administered 2017-06-29 – 2017-06-30 (×2): 3 mL via RESPIRATORY_TRACT
  Filled 2017-06-29 (×3): qty 3

## 2017-06-29 MED ORDER — BUDESONIDE 0.5 MG/2ML IN SUSP
0.5000 mg | Freq: Two times a day (BID) | RESPIRATORY_TRACT | Status: DC
  Administered 2017-06-29 – 2017-06-30 (×2): 0.5 mg via RESPIRATORY_TRACT
  Filled 2017-06-29 (×2): qty 2

## 2017-06-29 NOTE — Care Management Note (Signed)
Case Management Note  Patient Details  Name: Sarah Mckee MRN: [id number] Date of Birth: 12/04/1945  Subjective/Objective:                  RNCM met with patient to discuss transition of care. She is from home with her husband. She states she uses a cane when out and about. She has a walker but has not used it. She cannot walk long-distances.  She uses a ECV when she goes out to grocery store. She has used [facility] in the past but not certain she will need it at discharge. It is noted that she has problems paying for medications. She did not indicate this was a problem. She has chronic O2 also through [facility].   Action/Plan: Referral to [facility] with [facility] in the event she needs it.   Expected Discharge Date:                  Expected Discharge Plan:     In-House Referral:     Discharge planning Services  CM Consult  Post Acute Care Choice:  Home Health Choice offered to:  Patient  DME Arranged:    DME Agency:     HH Arranged:  PT [place]:  [place]  Status of Service:  In process, will continue to follow  If discussed at Long Length of Stay Meetings, dates discussed:    Additional Comments:  Marshell Garfinkel, RN 06/29/2017, 1:18 PM

## 2017-06-29 NOTE — Progress Notes (Signed)
Patient now considering kyphoplasty, will order thoracic MRI to evaluate acutity of T7 and if adjacent fractures present

## 2017-06-29 NOTE — Evaluation (Signed)
Physical Therapy Evaluation Patient Details Name: Sarah MedalJanet Mckee MRN: [id number] DOB: [birth year] Today's Date: 06/29/2017   History of Present Illness  Pt admitted COPD exacerbation with complaints of SOB symptoms and back pain. Also noted acute on chronic T7 compression fracture. History includes COPD and HTN. Pt on 2L of O2 at baseline. Of note, recent diagnosis of flu.  Clinical Impression  Pt is a pleasant 72 year old female who was admitted for COPD exacerbation. Pt performs bed mobility independently, transfers with mod I, and ambulation with supervision and RW. Pt demonstrates deficits with endurance/mobility. O2 sats decrease quickly with exertion, although improve with seated rest break. Educated on pursed lip breathing. Pt reports no pain in back. Educated on back precautions. Would benefit from skilled PT to address above deficits and promote optimal return to PLOF. Recommend transition to HHPT upon discharge from acute hospitalization.       Follow Up Recommendations Home health PT    Equipment Recommendations  None recommended by PT    Recommendations for Other Services       Precautions / Restrictions Precautions Precautions: Fall Restrictions Weight Bearing Restrictions: No      Mobility  Bed Mobility Overal bed mobility: Independent             General bed mobility comments: safe technique performed with log roll performed. HOB slightly elevated. Once seated at EOB, able to sit with upright posture  Transfers Overall transfer level: Needs assistance Equipment used: Rolling walker (2 wheeled) Transfers: Sit to/from Stand Sit to Stand: Modified independent (Device/Increase time)         General transfer comment: pushes from seated surface with safe technique. Upright posture noted  Ambulation/Gait Ambulation/Gait assistance: Supervision Ambulation Distance (Feet): 200 Feet Assistive device: Rolling walker (2 wheeled) Gait Pattern/deviations:  Step-through pattern     General Gait Details: ambulated around RN station with good speed. Does fatigue with increased distance. 2L of O2 donned for mobility with sats decreasing to 73% with exertion. With seated rest break, they improve to 91%.  Stairs            Wheelchair Mobility    Modified Rankin (Stroke Patients Only)       Balance Overall balance assessment: Modified Independent                                           Pertinent Vitals/Pain Pain Assessment: No/denies pain    Home Living Family/patient expects to be discharged to:: Private residence Living Arrangements: Spouse/significant other Available Help at Discharge: Family Type of Home: House Home Access: Stairs to enter Entrance Stairs-Rails: Can reach both Entrance Stairs-Number of Steps: 8 Home Layout: One level Home Equipment: Environmental consultantWalker - 2 wheels      Prior Function Level of Independence: Independent               Hand Dominance        Extremity/Trunk Assessment   Upper Extremity Assessment Upper Extremity Assessment: Overall WFL for tasks assessed    Lower Extremity Assessment Lower Extremity Assessment: Generalized weakness(B LE grossly 4+5)       Communication   Communication: No difficulties  Cognition Arousal/Alertness: Awake/alert Behavior During Therapy: WFL for tasks assessed/performed Overall Cognitive Status: Within Functional Limits for tasks assessed  General Comments      Exercises Other Exercises Other Exercises: ambulated to bathroom with urgency. Safe technique. Supervision for hygiene. Uses RW safely and maintained balance to use hand sanitizer.   Assessment/Plan    PT Assessment Patient needs continued PT services  PT Problem List Decreased activity tolerance;Decreased mobility;Cardiopulmonary status limiting activity       PT Treatment Interventions Gait training;Stair  training    PT Goals (Current goals can be found in the Care Plan section)  Acute Rehab PT Goals Patient Stated Goal: to get stronger PT Goal Formulation: With patient Time For Goal Achievement: 07/13/17 Potential to Achieve Goals: Good    Frequency Min 2X/week   Barriers to discharge        Co-evaluation               AM-PAC PT "6 Clicks" Daily Activity  Outcome Measure Difficulty turning over in bed (including adjusting bedclothes, sheets and blankets)?: None Difficulty moving from lying on back to sitting on the side of the bed? : None Difficulty sitting down on and standing up from a chair with arms (e.g., wheelchair, bedside commode, etc,.)?: None Help needed moving to and from a bed to chair (including a wheelchair)?: None Help needed walking in hospital room?: None Help needed climbing 3-5 steps with a railing? : A Little 6 Click Score: 23    End of Session Equipment Utilized During Treatment: Gait belt;Oxygen Activity Tolerance: Patient tolerated treatment well Patient left: in chair;with chair alarm set Nurse Communication: Mobility status PT Visit Diagnosis: Difficulty in walking, not elsewhere classified (R26.2)    Time: 1610-9604 PT Time Calculation (min) (ACUTE ONLY): 23 min   Charges:   PT Evaluation $PT Eval Low Complexity: 1 Low PT Treatments $Therapeutic Activity: 8-22 mins   PT G CodesElizabeth Palau, PT, DPT [phone]   Sarah Mckee 06/29/2017, 11:46 AM

## 2017-06-29 NOTE — Progress Notes (Signed)
[facility] - [place] [facility]   PATIENT NAME: Sarah Mckee    MR#:  [id number]  DATE OF BIRTH:  01-May-1946  SUBJECTIVE:   Patient here due to shortness of breath secondary to COPD exacerbation. Still having significant wheezing and bronchospasm and exertional dyspnea. Patient has a T7 compression fracture and is complaining of some back pain but improved since admission.  REVIEW OF SYSTEMS:    Review of Systems  Constitutional: Negative for chills and fever.  HENT: Negative for congestion and tinnitus.   Eyes: Negative for blurred vision and double vision.  Respiratory: Positive for cough and shortness of breath. Negative for wheezing.   Cardiovascular: Negative for chest pain, orthopnea and PND.  Gastrointestinal: Negative for abdominal pain, diarrhea, nausea and vomiting.  Genitourinary: Negative for dysuria and hematuria.  Musculoskeletal: Positive for back pain.  Neurological: Negative for dizziness, sensory change and focal weakness.  All other systems reviewed and are negative.   Nutrition: Heart Healthy Tolerating Diet: Yes Tolerating PT: Eval noted.   DRUG ALLERGIES:   Allergies  Allergen Reactions  . Codeine Shortness Of Breath  . Demerol [Meperidine] Shortness Of Breath  . Morphine And Related Shortness Of Breath  . Oxycodone Shortness Of Breath  . Alendronate Other (See Comments)    Other reaction(s): Other (See Comments) esophagitis  . Amoxicillin-Pot Clavulanate Other (See Comments)    Unknown Has patient had a PCN reaction causing immediate rash, facial/tongue/throat swelling, SOB or lightheadedness with hypotension: Unknown Has patient had a PCN reaction causing severe rash involving mucus membranes or skin necrosis: Unknown Has patient had a PCN reaction that required hospitalization: Unknown Has patient had a PCN reaction occurring within the last 10 years: Unknown If all of the above answers are "NO", then may proceed with  Cephalosporin use.   Marland Kitchen Risedronate Other (See Comments)    Other reaction(s): Other (See Comments) epigastric pain  . Shellfish Allergy Nausea And Vomiting    VITALS:  Blood pressure (!) 138/94, pulse (!) 101, temperature 98 F (36.7 C), temperature source Oral, resp. rate 20, height 5\' 2"  (1.575 m), weight 81.6 kg (180 lb), SpO2 98 %.  PHYSICAL EXAMINATION:   Physical Exam  GENERAL:  72 y.o.-year-old patient lying in bed in mild Resp. Distress.   EYES: Pupils equal, round, reactive to light and accommodation. No scleral icterus. Extraocular muscles intact.  HEENT: Head atraumatic, normocephalic. Oropharynx and nasopharynx clear.  NECK:  Supple, no jugular venous distention. No thyroid enlargement, no tenderness.  LUNGS: Poor respiratory effort and prolonged inspiratory and expiratory phase. Expiratory wheezing bilaterally, no rales, rhonchi. No use of accessory muscles. CARDIOVASCULAR: S1, S2 normal. No murmurs, rubs, or gallops.  ABDOMEN: Soft, nontender, nondistended. Bowel sounds present. No organomegaly or mass.  EXTREMITIES: No cyanosis, clubbing or edema b/l.    NEUROLOGIC: Cranial nerves II through XII are intact. No focal Motor or sensory deficits b/l.  Globally weak PSYCHIATRIC: The patient is alert and oriented x 3.  SKIN: No obvious rash, lesion, or ulcer.    LABORATORY PANEL:   CBC Recent Labs  Lab 06/28/17 0422  WBC 4.8  HGB 12.9  HCT 38.2  PLT 181   ------------------------------------------------------------------------------------------------------------------  Chemistries  Recent Labs  Lab 06/27/17 0814 06/28/17 0422  NA 140 139  K 3.9 4.0  CL 96* 96*  CO2 34* 32  GLUCOSE 136* 148*  BUN 18 19  CREATININE 0.63 0.65  CALCIUM 9.0 8.9  AST 21  --   ALT 20  --  ALKPHOS 67  --   BILITOT 0.9  --    ------------------------------------------------------------------------------------------------------------------  Cardiac Enzymes Recent Labs   Lab 06/27/17 0814  TROPONINI <0.03   ------------------------------------------------------------------------------------------------------------------  RADIOLOGY:  No results found.   ASSESSMENT AND PLAN:   72 year old female with past history of COPD, hypertension, pulmonary hypertension, osteoarthritis who presented to the hospital due to shortness of breath, back pain.  1. Acute on chronic respiratory failure with hypoxia-secondary to COPD exacerbation. -Continue O2 supplementation, continue IV steroids, scheduled DuoNeb's, Pulmicort nebs. -Wean O2 as tolerated.  2. COPD exacerbation-this is causing patient's worsening respiratory failure. -Patient failed outpatient treatment with oral prednisone and Zithromax. Continue IV steroids, scheduled DuoNeb's, Pulmicort nebs. Patient's chest x-ray on admission was negative for acute pathology. -Patient's guardian oxygen at home.  3. Back pain-this is secondary to a compression fracture. -Seen by orthopedics and continue supportive care with pain control. Did discussed with the patient about possible kyphoplasty but she presently is not interested in it. Continue physical therapy, pain control. -Continue Toradol for pain control.  4. Anxiety-continue Xanax.  5. Essential hypertension-continue triamterene/HCTZ.  6. GERD-continue Pepcid.  All the records are reviewed and case discussed with Care Management/Social Worker. Management plans discussed with the patient, family and they are in agreement.  CODE STATUS: Full code  DVT Prophylaxis: Heparin subcutaneous  TOTAL TIME TAKING CARE OF THIS PATIENT: 30 minutes.   POSSIBLE D/C IN 1-2 DAYS, DEPENDING ON CLINICAL CONDITION.   Houston SirenSAINANI,VIVEK J M.D on 06/29/2017 at 12:58 PM  Between 7am to 6pm - Pager - [phone]  After 6pm go to [email or web address] - Social research officer, governmentpassword EPAS [facility]  [facility] [facility]  Office  [phone]  CC: Primary care physician; Patrice ParadiseMcLaughlin,  Miriam K, MD

## 2017-06-29 NOTE — Progress Notes (Signed)
Subjective: Patient tolerated getting out of bed to use the bedside commode yesterday without any undue pain in her back.  She has no new complaints.   Objective: Vital signs in last 24 hours: Temp:  [97.9 F (36.6 C)-98.3 F (36.8 C)] 97.9 F (36.6 C) (02/11 0626) Pulse Rate:  [93-103] 93 (02/11 0626) Resp:  [18-20] 20 (02/11 0626) BP: (132-151)/(56-92) 151/74 (02/11 0626) SpO2:  [74 %-96 %] 95 % (02/11 0626) FiO2 (%):  [28 %] 28 % (02/10 2135)  Intake/Output from previous day: 02/10 0701 - 02/11 0700 In: 360 [P.O.:360] Out: 120 [Urine:120] Intake/Output this shift: No intake/output data recorded.  Recent Labs    06/27/17 0814 06/28/17 0422  HGB 14.2 12.9   Recent Labs    06/27/17 0814 06/28/17 0422  WBC 7.7 4.8  RBC 4.64 4.21  HCT 42.5 38.2  PLT 190 181   Recent Labs    06/27/17 0814 06/28/17 0422  NA 140 139  K 3.9 4.0  CL 96* 96*  CO2 34* 32  BUN 18 19  CREATININE 0.63 0.65  GLUCOSE 136* 148*  CALCIUM 9.0 8.9   No results for input(s): LABPT, INR in the last 72 hours.  Physical Exam: Unchanged versus yesterday.  X-rays:  Assessment: Acute on chronic T7 compression fracture, presently stable symptomatically.  Plan: At this point, the patient does not wish to consider any additional surgical treatment, specifically a kyphoplasty.  She may continue to be mobilized with therapy as symptoms permit, and receive appropriate pain medication as needed.  Thank you for asked me to participate in the care of this most delightful woman.  She has been instructed to contact the office if she has continued or worsening mid or lower back pain.   Excell SeltzerJohn J Poggi 06/29/2017, 8:01 AM

## 2017-06-30 MED ORDER — PREDNISONE 10 MG PO TABS: ORAL_TABLET | ORAL | 0 refills | Status: DC

## 2017-06-30 MED ORDER — HYDROMORPHONE HCL 2 MG PO TABS: 2.0000 mg | ORAL_TABLET | Freq: Four times a day (QID) | ORAL | 0 refills | Status: DC | PRN

## 2017-06-30 NOTE — Care Management (Addendum)
RNCM spoke with patient regarding medication assistance with Spiriva and Breo.  I have sent request to pharmacy extention [phone] to see if they can assist her.  Patient agrees to home health services. I have notified Barbara CowerJason with [facility] of this need. RN updated.

## 2017-06-30 NOTE — Discharge Summary (Signed)
[employer] -  at [facility]   PATIENT NAME: Sarah Mckee    MR#:  [id number]  DATE OF BIRTH:  13-Aug-1945  DATE OF ADMISSION:  06/27/2017 ADMITTING PHYSICIAN: [name], MD  DATE OF DISCHARGE: 06/30/2017  PRIMARY CARE PHYSICIAN: Patrice Paradise, MD    ADMISSION DIAGNOSIS:  COPD exacerbation (HCC) [J44.1] Closed wedge compression fracture of seventh thoracic vertebra, initial encounter (HCC) [S22.060A]  DISCHARGE DIAGNOSIS:  Active Problems:   COPD with acute exacerbation (HCC)   SECONDARY DIAGNOSIS:   Past Medical History:  Diagnosis Date  . Arthritis   . COPD (chronic obstructive pulmonary disease) (HCC)    2L 02 at baseline  . Hypertension   . Pulmonary HTN [facility])     HOSPITAL COURSE:   72 year old female with past history of COPD, hypertension, pulmonary hypertension, osteoarthritis who presented to the hospital due to shortness of breath, back pain.  1. Acute on chronic respiratory failure with hypoxia-secondary to COPD exacerbation. -Patient was treated with IV steroids, scheduled DuoNeb's and Pulmicort nebs. She has improved. She has no further wheezing and bronchospasm. She is being discharged home on oral prednisone taper, Breo, albuterol inhaler as needed. Patient is already on oxygen at home which she will continue.  2. COPD exacerbation-this was causing patient's worsening respiratory failure. -Patient failed outpatient treatment with oral prednisone and Zithromax. Patient was treated with IV steroids, scheduled DuoNeb's, Pulmicort nebs and has clinically improved. Her chest x-ray was negative for acute pneumonia. She is being discharged home on her maintenance inhalers, prednisone taper, DuoNeb nebs as needed. She will continue her chronic oxygen at 2 L at home.  3. Back pain-this was secondary to a compression fracture. - And had MRI of the back done which showed a T7 compression fracture but it was subacute/chronic.  Orthopedics was consulted and he did not recommend surgical intervention presently. Patient was discharged on oral pain control, and home health PT.   4. Anxiety- she will continue Xanax.  5. Essential hypertension- she will continue triamterene/HCTZ.  6. GERD- She will cont. Her Zantac.    DISCHARGE CONDITIONS:   Stable.   CONSULTS OBTAINED:    DRUG ALLERGIES:   Allergies  Allergen Reactions  . Codeine Shortness Of Breath  . Demerol [Meperidine] Shortness Of Breath  . Morphine And Related Shortness Of Breath  . Oxycodone Shortness Of Breath  . Alendronate Other (See Comments)    Other reaction(s): Other (See Comments) esophagitis  . Amoxicillin-Pot Clavulanate Other (See Comments)    Unknown Has patient had a PCN reaction causing immediate rash, facial/tongue/throat swelling, SOB or lightheadedness with hypotension: Unknown Has patient had a PCN reaction causing severe rash involving mucus membranes or skin necrosis: Unknown Has patient had a PCN reaction that required hospitalization: Unknown Has patient had a PCN reaction occurring within the last 10 years: Unknown If all of the above answers are "NO", then may proceed with Cephalosporin use.   Marland Kitchen Risedronate Other (See Comments)    Other reaction(s): Other (See Comments) epigastric pain  . Shellfish Allergy Nausea And Vomiting    DISCHARGE MEDICATIONS:   Allergies as of 06/30/2017      Reactions   Codeine Shortness Of Breath   Demerol [meperidine] Shortness Of Breath   Morphine And Related Shortness Of Breath   Oxycodone Shortness Of Breath   Alendronate Other (See Comments)   Other reaction(s): Other (See Comments) esophagitis   Amoxicillin-pot Clavulanate Other (See Comments)   Unknown Has patient had a PCN reaction  causing immediate rash, facial/tongue/throat swelling, SOB or lightheadedness with hypotension: Unknown Has patient had a PCN reaction causing severe rash involving mucus membranes or skin  necrosis: Unknown Has patient had a PCN reaction that required hospitalization: Unknown Has patient had a PCN reaction occurring within the last 10 years: Unknown If all of the above answers are "NO", then may proceed with Cephalosporin use.   Risedronate Other (See Comments)   Other reaction(s): Other (See Comments) epigastric pain   Shellfish Allergy Nausea And Vomiting      Medication List    TAKE these medications   acetaminophen 500 MG tablet Commonly known as:  TYLENOL Take 500-1,000 mg by mouth See admin instructions. Take 500 mg by mouth in the morning/afternoon and take 1000 mg by mouth in the evening   ALPRAZolam 0.25 MG tablet Commonly known as:  XANAX Take 0.25 mg by mouth 2 (two) times daily as needed for anxiety.   aluminum-magnesium hydroxide-simethicone 200-200-20 MG/5ML Susp Commonly known as:  MAALOX Take 30 mLs by mouth 4 (four) times daily -  before meals and at bedtime. What changed:    when to take this  reasons to take this   ASPERCREME LIDOCAINE EX Apply 1 application topically as needed (for hand/shoulder pain).   BREO ELLIPTA 200-25 MCG/INH Aepb Generic drug:  fluticasone furoate-vilanterol INHALE 1 PUFF BY MOUTH ONCE DAILY   furosemide 40 MG tablet Commonly known as:  LASIX Take 40 mg by mouth daily as needed for fluid.   HYDROmorphone 2 MG tablet Commonly known as:  DILAUDID Take 1 tablet (2 mg total) by mouth every 6 (six) hours as needed for moderate pain or severe pain.   montelukast 10 MG tablet Commonly known as:  SINGULAIR Take 10 mg by mouth daily.   OXYGEN Inhale into the lungs continuous.   potassium chloride SA 20 MEQ tablet Commonly known as:  K-DUR,KLOR-CON Take 20 mEq by mouth daily.   predniSONE 10 MG tablet Commonly known as:  DELTASONE Label  & dispense according to the schedule below. 5 Pills PO for 1 day then, 4 Pills PO for 1 day, 3 Pills PO for 1 day, 2 Pills PO for 1 day, 1 Pill PO for 1 days then STOP.    PROAIR HFA 108 (90 Base) MCG/ACT inhaler Generic drug:  albuterol Inhale 2 puffs into the lungs every 4 (four) hours as needed for wheezing or shortness of breath.   albuterol (2.5 MG/3ML) 0.083% nebulizer solution Commonly known as:  PROVENTIL Take 3 mLs (2.5 mg total) by nebulization every 6 (six) hours as needed for wheezing or shortness of breath.   ranitidine 150 MG capsule Commonly known as:  ZANTAC Take 1 capsule (150 mg total) by mouth 2 (two) times daily. What changed:  when to take this   SYSTANE OP Place 1 drop into both eyes daily.   Tiotropium Bromide Monohydrate 2.5 MCG/ACT Aers Commonly known as:  SPIRIVA RESPIMAT Inhale 2 puffs into the lungs daily.   triamterene-hydrochlorothiazide 37.5-25 MG tablet Commonly known as:  MAXZIDE-25 Take 1 tablet by mouth daily.         DISCHARGE INSTRUCTIONS:   DIET:  Cardiac diet  DISCHARGE CONDITION:  Stable  ACTIVITY:  Activity as tolerated  OXYGEN:  Home Oxygen: Yes.     Oxygen Delivery: 2 liters/min via Patient connected to nasal cannula oxygen  DISCHARGE LOCATION:  Home with Home Health PT, RN, Aide  If you experience worsening of your admission symptoms, develop shortness of breath,  life threatening emergency, suicidal or homicidal thoughts you must seek medical attention immediately by calling 911 or calling your MD immediately  if symptoms less severe.  You Must read complete instructions/literature along with all the possible adverse reactions/side effects for all the Medicines you take and that have been prescribed to you. Take any new Medicines after you have completely understood and accpet all the possible adverse reactions/side effects.   Please note  You were cared for by a hospitalist during your hospital stay. If you have any questions about your discharge medications or the care you received while you were in the hospital after you are discharged, you can call the unit and asked to speak with  the hospitalist on call if the hospitalist that took care of you is not available. Once you are discharged, your primary care physician will handle any further medical issues. Please note that NO REFILLS for any discharge medications will be authorized once you are discharged, as it is imperative that you return to your primary care physician (or establish a relationship with a primary care physician if you do not have one) for your aftercare needs so that they can reassess your need for medications and monitor your lab values.     Today   Shortness of breath, wheezing much improved. Back pain has improved. Will d/c home with Home Health today.   VITAL SIGNS:  Blood pressure (!) 151/84, pulse 96, temperature (!) 97.5 F (36.4 C), temperature source Oral, resp. rate 18, height 5\' 2"  (1.575 m), weight 81.6 kg (180 lb), SpO2 96 %.  I/O:    Intake/Output Summary (Last 24 hours) at 06/30/2017 1249 Last data filed at 06/30/2017 1024 Gross per 24 hour  Intake 600 ml  Output 1000 ml  Net -400 ml    PHYSICAL EXAMINATION:   GENERAL:  72 y.o.-year-old patient lying in bed in mild Resp. Distress.   EYES: Pupils equal, round, reactive to light and accommodation. No scleral icterus. Extraocular muscles intact.  HEENT: Head atraumatic, normocephalic. Oropharynx and nasopharynx clear.  NECK:  Supple, no jugular venous distention. No thyroid enlargement, no tenderness.  LUNGS: Poor respiratory effort and prolonged inspiratory and expiratory phase. Expiratory wheezing bilaterally, no rales, rhonchi. No use of accessory muscles. CARDIOVASCULAR: S1, S2 normal. No murmurs, rubs, or gallops.  ABDOMEN: Soft, nontender, nondistended. Bowel sounds present. No organomegaly or mass.  EXTREMITIES: No cyanosis, clubbing or edema b/l.    NEUROLOGIC: Cranial nerves II through XII are intact. No focal Motor or sensory deficits b/l.  Globally weak PSYCHIATRIC: The patient is alert and oriented x 3.  SKIN: No  obvious rash, lesion, or ulcer.    DATA REVIEW:   CBC Recent Labs  Lab 06/28/17 0422  WBC 4.8  HGB 12.9  HCT 38.2  PLT 181    Chemistries  Recent Labs  Lab 06/27/17 0814 06/28/17 0422  NA 140 139  K 3.9 4.0  CL 96* 96*  CO2 34* 32  GLUCOSE 136* 148*  BUN 18 19  CREATININE 0.63 0.65  CALCIUM 9.0 8.9  AST 21  --   ALT 20  --   ALKPHOS 67  --   BILITOT 0.9  --     Cardiac Enzymes Recent Labs  Lab 06/27/17 0814  TROPONINI <0.03    Microbiology Results  Results for orders placed or performed during the hospital encounter of 08/11/15  Rapid Influenza A&B Antigens ([facility] only)     Status: Abnormal   Collection Time: 08/11/15  9:26 AM  Result Value Ref Range Status   Influenza A ([facility]) POSITIVE (A) NEGATIVE Final   Influenza B ([facility]) NEGATIVE NEGATIVE Final  Blood culture (routine x 2)     Status: None   Collection Time: 08/11/15  9:29 AM  Result Value Ref Range Status   Specimen Description BLOOD LEFT WRIST  Final   Special Requests BOTTLES DRAWN AEROBIC AND ANAEROBIC  3CC  Final   Culture NO GROWTH 5 DAYS  Final   Report Status 08/16/2015 FINAL  Final  Blood culture (routine x 2)     Status: None   Collection Time: 08/11/15 11:10 AM  Result Value Ref Range Status   Specimen Description BLOOD LEFT HAND  Final   Special Requests BOTTLES DRAWN AEROBIC AND ANAEROBIC  1CC  Final   Culture NO GROWTH 5 DAYS  Final   Report Status 08/16/2015 FINAL  Final  MRSA PCR Screening     Status: None   Collection Time: 08/11/15  1:50 PM  Result Value Ref Range Status   MRSA by PCR NEGATIVE NEGATIVE Final    Comment:        The GeneXpert MRSA Assay (FDA approved for NASAL specimens only), is one component of a comprehensive MRSA colonization surveillance program. It is not intended to diagnose MRSA infection nor to guide or monitor treatment for MRSA infections.     RADIOLOGY:  Mr Thoracic Spine Wo Contrast  Result Date: 06/29/2017 CLINICAL DATA:  T7  compression fracture EXAM: MRI THORACIC SPINE WITHOUT CONTRAST TECHNIQUE: Multiplanar, multisequence MR imaging of the thoracic spine was performed. No intravenous contrast was administered. COMPARISON:  Chest radiograph 06/27/2017 Chest CT 07/17/2016 FINDINGS: Alignment: Mildly increased thoracic kyphosis at the T7 level. No static subluxation. Vertebrae: Compression deformity of the T7 vertebral body has worsened compared to 07/17/2016. There is minimal hyperintense T2-weighted signal within the marrow. Chronic compression deformities at T3, T11 and L1 are unchanged. No focal marrow lesions. No discitis-osteomyelitis. Cord:  Normal signal Paraspinal and other soft tissues: 3 mm left upper lobe pulmonary nodule, unchanged. Disc levels: No spinal canal or neural foraminal stenosis. IMPRESSION: 1. Progression of T7 compression deformity compared to 07/17/2016. There is minimal marrow edema and fracture is likely subacute or chronic. 2. Unchanged compression deformities at T3, T11 and L1. 3. No spinal canal or neural foraminal stenosis. Electronically Signed   By: Deatra Robinson M.D.   On: 06/29/2017 15:22      Management plans discussed with the patient, family and they are in agreement.  CODE STATUS:     Code Status Orders  (From admission, onward)        Start     Ordered   06/27/17 1303  Full code  Continuous     06/27/17 1302    Code Status History    Date Active Date Inactive Code Status Order ID Comments User Context   08/11/2015 11:30 08/16/2015 17:12 Full Code [id number]  [name], MD ED    Advance Directive Documentation     Most Recent Value  Type of Advance Directive  Healthcare Power of Attorney  Pre-existing out of facility DNR order (yellow form or pink MOST form)  No data  "MOST" Form in Place?  No data      TOTAL TIME TAKING CARE OF THIS PATIENT: 40 minutes.    Houston Siren M.D on 06/30/2017 at 12:49 PM  Between 7am to 6pm - Pager - [phone]  After 6pm go  to [email or web address] - password [email or web address]  [facility]  [facility] [employer] [place] [facility]  Office  [phone]  CC: Primary care physician; Patrice ParadiseMcLaughlin, Miriam K, MD

## 2017-06-30 NOTE — Progress Notes (Signed)
MRI shows compression fracture at T7 is chronic, no indication for kyphoplasty. Recommend continued PT for back pain.

## 2017-06-30 NOTE — Progress Notes (Signed)
Patient is discharge home in a stable condition, summary and f/u care given , verbalized understanding , left with husband,

## 2017-07-02 DIAGNOSIS — I10 Essential (primary) hypertension: Secondary | ICD-10-CM | POA: Diagnosis not present

## 2017-07-02 DIAGNOSIS — J9621 Acute and chronic respiratory failure with hypoxia: Secondary | ICD-10-CM | POA: Diagnosis not present

## 2017-07-02 DIAGNOSIS — Z7951 Long term (current) use of inhaled steroids: Secondary | ICD-10-CM | POA: Diagnosis not present

## 2017-07-02 DIAGNOSIS — M8008XD Age-related osteoporosis with current pathological fracture, vertebra(e), subsequent encounter for fracture with routine healing: Secondary | ICD-10-CM | POA: Diagnosis not present

## 2017-07-02 DIAGNOSIS — J441 Chronic obstructive pulmonary disease with (acute) exacerbation: Secondary | ICD-10-CM | POA: Diagnosis not present

## 2017-07-02 DIAGNOSIS — I272 Pulmonary hypertension, unspecified: Secondary | ICD-10-CM | POA: Diagnosis not present

## 2017-07-02 DIAGNOSIS — F419 Anxiety disorder, unspecified: Secondary | ICD-10-CM | POA: Diagnosis not present

## 2017-07-02 DIAGNOSIS — K219 Gastro-esophageal reflux disease without esophagitis: Secondary | ICD-10-CM | POA: Diagnosis not present

## 2017-07-02 DIAGNOSIS — Z87891 Personal history of nicotine dependence: Secondary | ICD-10-CM | POA: Diagnosis not present

## 2017-07-02 DIAGNOSIS — Z9981 Dependence on supplemental oxygen: Secondary | ICD-10-CM | POA: Diagnosis not present

## 2017-07-06 ENCOUNTER — Other Ambulatory Visit: Payer: Self-pay | Admitting: *Deleted

## 2017-07-06 NOTE — Patient Outreach (Signed)
[facility] Care Management  07/06/2017  Sarah MedalJanet Gatti 24-Oct-1945 [id number]  EMMI- General Discharge RED ON EMMI ALERT DAY#: 1 DATE: 07/03/17 RED ALERT: Know who to call about changes in condition? No  Outreach attempt # 1 to patient. HIPAA identifiers verified with patient. Patient confirmed not knowing who to contact for any changes in her condition. She stated, [facility] advised her to contact their office for any concerns/questions. RN CM advised patient to contact her Pulmonologist for any questions/concerns related to her lungs. RN CM provided patient with 24 hour nurse advice line number.   Patient was hospitalized from 06/27/17 to 06/30/17 for COPD exacerbation and closed wedge compression fracture of the 7th thoracic vertebrae. Patient verbalized being in constant pain after being discharged from the hospital. She was not clear on the cause of the pain. She stated, the pain medicine that was prescribed has been ineffective in relieving her pain. She discussed having an MRI in the hospital and being told she wasn't a candidate for "cementing in the spine".  Home health PT was arranged through the hospital for compression fractures, per EMR. Patient stated, she has been contacted by PT and services were initially scheduled to start to today. Patient postponed PT services to start on Wednesday due to her daughter's schedule. Patient acknowledged having a history of Osteoporosis. Patient reported, she is currently using a wheelchair and she attempts to use a rolling walker while in her home. RN CM asked patient about making an appointment to visit with an Orthopedic MD. She is not sure about at this time. She stated, a fracture usually heals in about 8 to 13 weeks. She stated, her feet are so "huge and swelling". She stated, she can't walk at this time. She can't get out of the house for a hospital follow-up MD appointment. Patient verbalized having a steep incline  outside of her home, which limits her ability mobility to travel in and out of her home. She is needing assistance with her ADL's. Her husband is unable to assist due to his medical conditions, per patient. She has a scheduled pulmonary appointment on 08/11/17.    Plan: RN CM advised patient to contact RNCM for any needs or concerns. RN CM advised patient to alert MD for any changes in conditions.  RN CM [facility] for further in assessment of care needs and management of chronic conditions.   Wynelle ClevelandJuanita Shermaine Brigham, RN, BSN, MHA/MSL, [facility] Telephonic Care Manager Coordinator [facility] Direct Phone: [phone] Toll Free: [phone] Fax: [phone]

## 2017-07-09 ENCOUNTER — Encounter: Payer: Self-pay | Admitting: *Deleted

## 2017-07-09 ENCOUNTER — Other Ambulatory Visit: Payer: Self-pay | Admitting: *Deleted

## 2017-07-09 NOTE — Patient Outreach (Signed)
07/09/2017   Successful telephone encounter to Sarah Mckee, 72 year old female- follow up on referral received 07/07/17 from [facility] telephonic RN CM for [facility] [employer] [employer]  Services- further assessment of care needs/management of chronic conditions.  [facility] tele[facility]onic RN CM followed up with pt on  EMMI general discharge and pt had a recent hospitalization February 9-12,2019 for COPD exacerbation, closed  Fracture of 7th thoracic vertebra.   Pt's history includes but not limited to acute  Respiratory failure, solitary pulmonary nodule, Arthritis.   Spoke with pt, HIPAA  Identifiers verified, discussed purpose of call- follow up on referral for Community  CM services, recent hospital discharge.  Pt reports on recent hospitalization, came  In because could not breath due to fracture of 7th vertebra.   Pt reports pain in  Back not bad, prescribed Ibuprofen and Dilaudid, did not take the Dilaudid last night, managing pain with just Ibuprofen now.  Pt reports on continuous 2 Liters [place], taking all of her medications post discharge instructions, did not want to review  Medications with RN CM at this time, went over them too much- [facility], [facility].   Pt reports to follow up with Lung MD 3/22 - already scheduled appointment.   RN CM discussed following up with PCP post discharge to which  Pt reports cannot get out of house to see MD at this time, has a incline outside her home, scheduled to see MD in April.   RN CM discussed with pt Mckee transition of care program- follow up 31 days (weekly calls, a home visit) benefit of her  Insurance/no cost to her to which pt agreed.   Plan:  As discussed with pt, plan to follow up again next week- initial home  Visit.                Barrier letter sent to Dr. Merlinda Mckee informing of [facility] involvement.    Sarah Alkenose M.   Sarah Turvey RN CCM [facility] Care Management  [phone]

## 2017-07-14 ENCOUNTER — Encounter: Payer: Self-pay | Admitting: *Deleted

## 2017-07-14 ENCOUNTER — Other Ambulatory Visit: Payer: Self-pay | Admitting: *Deleted

## 2017-07-14 NOTE — Patient Outreach (Signed)
[facility]) Care Management   07/14/2017  Sarah MedalJanet Mckee August 09, 1945 [id number]  Sarah MedalJanet Mckee is an 72 y.o. female  Subjective:  Pt reports continues to have pain across mid back, result of compression  Fracture 7th vertebra.  Pt reports she only takes the Dilaudid if needed, will use heat  Or cold for the pain.   Pt reports will get sob during the day when anxious,in pain, with  Exertion.  Pt [facility] to which RN CM suggested  Taking pain medication prior to PT session.    Pt reports currently it is  Too difficult to get out of her house to follow up with doctors (PCP, Orthopedic),has  An appointment to see Lung MD 3/26, hopeful pain will be better.        Objective:  Vitals:   07/14/17 1130  BP: 134/76  Pulse: 86  Resp: 20  SpO2: 99%     ROS  Physical Exam  Constitutional: She is oriented to person, place, and time. She appears well-developed and well-nourished.  Cardiovascular: Normal rate, regular rhythm and normal heart sounds.  Respiratory: Effort normal and breath sounds normal.  Pt on continuous 2 liters [place]   GI: Soft. Bowel sounds are normal.  Musculoskeletal: She exhibits edema.  +2 edema to bilateral lower extremities/top of feet.   Neurological: She is alert and oriented to person, place, and time.  Skin: Skin is warm and dry.  Psychiatric: She has a normal mood and affect. Her behavior is normal. Judgment and thought content normal.    Encounter Medications:  Patient was recently discharged from hospital and all medications have been reviewed. Outpatient Encounter Medications as of 07/14/2017  Medication Sig Note  . acetaminophen (TYLENOL) 500 MG tablet Take 500-1,000 mg by mouth See admin instructions. Take 500 mg by mouth in the morning/afternoon and take 1000 mg by mouth in the evening   . albuterol (PROVENTIL) (2.5 MG/3ML) 0.083% nebulizer solution Take 3 mLs (2.5 mg total) by nebulization every 6 (six) hours  as needed for wheezing or shortness of breath.   . ALPRAZolam (XANAX) 0.25 MG tablet Take 0.25 mg by mouth 2 (two) times daily as needed for anxiety.  06/27/2017: Patient reports taking one dose this AM  . aluminum-magnesium hydroxide-simethicone (MAALOX) 200-200-20 MG/5ML SUSP Take 30 mLs by mouth 4 (four) times daily -  before meals and at bedtime. (Patient taking differently: Take 30 mLs by mouth 4 (four) times daily as needed (for indegestion). )   . ASPERCREME LIDOCAINE EX Apply 1 application topically as needed (for hand/shoulder pain).   Marland Kitchen. BREO ELLIPTA 200-25 MCG/INH AEPB INHALE 1 PUFF BY MOUTH ONCE DAILY   . furosemide (LASIX) 40 MG tablet Take 40 mg by mouth daily as needed for fluid.   Marland Kitchen. HYDROmorphone (DILAUDID) 2 MG tablet Take 1 tablet (2 mg total) by mouth every 6 (six) hours as needed for moderate pain or severe pain.   . montelukast (SINGULAIR) 10 MG tablet Take 10 mg by mouth daily.   . OXYGEN Inhale into the lungs continuous.   Sarah Mckee. Polyethyl Glycol-Propyl Glycol (SYSTANE OP) Place 1 drop into both eyes daily.   . potassium chloride SA (K-DUR,KLOR-CON) 20 MEQ tablet Take 20 mEq by mouth daily.    . predniSONE (DELTASONE) 10 MG tablet Label  & dispense according to the schedule below. 5 Pills PO for 1 day then, 4 Pills PO for 1 day, 3 Pills PO for 1 day,  2 Pills PO for 1 day, 1 Pill PO for 1 days then STOP.   Marland Kitchen PROAIR HFA 108 (90 Base) MCG/ACT inhaler Inhale 2 puffs into the lungs every 4 (four) hours as needed for wheezing or shortness of breath.    . ranitidine (ZANTAC) 150 MG capsule Take 1 capsule (150 mg total) by mouth 2 (two) times daily. (Patient taking differently: Take 150 mg by mouth daily. )   . Tiotropium Bromide Monohydrate (SPIRIVA RESPIMAT) 2.5 MCG/ACT AERS Inhale 2 puffs into the lungs daily.   Marland Kitchen triamterene-hydrochlorothiazide (MAXZIDE-25) 37.5-25 MG tablet Take 1 tablet by mouth daily.    No facility-administered encounter medications on file as of 07/14/2017.      Functional Status:   In your present state of health, do you have any difficulty performing the following activities: 07/14/2017 06/27/2017  Hearing? N N  Vision? N N  Difficulty concentrating or making decisions? N Y  Walking or climbing stairs? Y Y  Dressing or bathing? N Y  Doing errands, shopping? Y N  Preparing Food and eating ? Y -  Using the Toilet? N -  In the past six months, have you accidently leaked urine? N -  Do you have problems with loss of bowel control? N -  Managing your Medications? N -  Managing your Finances? N -  Housekeeping or managing your Housekeeping? Y -  Some recent data might be hidden    Fall/Depression Screening:    Fall Risk  07/14/2017  Falls in the past year? No   PHQ 2/9 Scores 07/14/2017  PHQ - 2 Score 0    Assessment:  Pleasant 72 year old female, resides with spouse, very supportive.  RN CM following  Pt for Transition of care, recent hospitalization February 9-12,2019 for COPD exacerbation, closed  Fracture of 7th thoracic vertebra.  Pt's history includes but not limited to acute respiratory failure, Solitary pulmonary nodule, Arthritis, Osteoporosis.   COPD:  Lungs clear, no reported or observed sob.  Pt taking all respiratory medications as      Ordered.   Chronic pain:  +7 today in mid lower back, result of  fracture of 7th thoracic vertebra- closed.   Edema- +2 bilateral lower extremities/top of feet.  Compression stockings on.  Per pt edema         Chronic.     Plan:  As discussed with pt, to continue to follow for transition of care- follow up again next       Week telephonically.             Plan to send Dr. Merlinda Frederick 07/15/27 initial home visit encounter.   [facility] CM Care Plan Problem One     Most Recent Value  Care Plan Problem One  Risk for readmission related to recent hospitalization for COPD exacerbation, closed wedge compression fracture of 5th thoracic vertebra   Role Documenting the Problem One  Care Management  Coordinator  Care Plan for Problem One  Active  [facility] Long Term Goal   Pt would not readmit to the hospital within the next 31 days   [facility] Long Term Goal Start Date  07/09/17  Interventions for Problem One Long Term Goal  Initial home visit done, discussed COPD action plan   [facility] CM Short Term Goal #1   Pt would take all medications for the next 30 days   [facility] CM Short Term Goal #1 Start Date  07/09/17  Interventions for Short Term Goal #1  Medications review completed  with pt.   [facility] CM Short Term Goal #2   Pt would keep all MD appointments in the next 30 days   [facility] CM Short Term Goal #2 Start Date  07/09/17  Interventions for Short Term Goal #2  Encouraged pt to keep appointment with Lung MD- per pt to see 08/11/17      Shayne Alken.   Katheleen Stella RN CCM [facility] Care Management  [phone]

## 2017-07-17 DIAGNOSIS — I272 Pulmonary hypertension, unspecified: Secondary | ICD-10-CM | POA: Diagnosis not present

## 2017-07-17 DIAGNOSIS — J9621 Acute and chronic respiratory failure with hypoxia: Secondary | ICD-10-CM | POA: Diagnosis not present

## 2017-07-17 DIAGNOSIS — J441 Chronic obstructive pulmonary disease with (acute) exacerbation: Secondary | ICD-10-CM | POA: Diagnosis not present

## 2017-07-17 DIAGNOSIS — M8008XD Age-related osteoporosis with current pathological fracture, vertebra(e), subsequent encounter for fracture with routine healing: Secondary | ICD-10-CM | POA: Diagnosis not present

## 2017-07-20 DIAGNOSIS — J449 Chronic obstructive pulmonary disease, unspecified: Secondary | ICD-10-CM | POA: Diagnosis not present

## 2017-07-22 ENCOUNTER — Other Ambulatory Visit: Payer: Self-pay | Admitting: *Deleted

## 2017-07-22 NOTE — Patient Outreach (Signed)
Successful telephone encounter to Sarah Mckee, 72 year old female for transition of care/ongoing follow up on recent  Hospitalization February 9-12,2019 for COPD exacerbation, closed fracture of 7th thoracic vertebra.  [facility] 07/07/17 for Community CM services. Pt's history includes but not limited to acute  Respiratory failure,solitary pulmonary nodule, arthritis.  Spoke with pt, HIPAA identifiers verified.   Pt reports doing good,  Breathing the same, if talk too much gets bad.   Pt reports pain in back still hurts, just took Ibuprofen which does help, only  Takes Dilaudid if pain really back, last time took it was 3/4 at night.  Pt reports taking all of her medications, edema in legs  Still there,has a girl come to apply compression stockings, edema  down when get up in the morning, up during the day as  it is difficult to elevate legs.  Pt reports did lie down yesterday and swelling did go down.   [facility] scheduled to follow up next week.  RN CM reminded pt to take pain medication prior to PT session to which pt  agreed to do.    RN CM discussed with pt coworker Systems analystLaine RN CM covering for this RN CM will follow up next week Telephonically.  Plan:  As discussed with pt, coworker Systems analystLaine RN CM covering for this RN CM to follow up next week telephonically      (part of ongoing transition of care).   Shayne Alkenose M.   Kalsey Lull RN CCM [facility] Care [facility]  [phone]

## 2017-07-28 ENCOUNTER — Encounter: Payer: Self-pay | Admitting: *Deleted

## 2017-07-28 ENCOUNTER — Other Ambulatory Visit: Payer: Self-pay | Admitting: *Deleted

## 2017-07-28 NOTE — Patient Outreach (Signed)
[facility]) Care Management [facility] Community CM Telephone Outreach, Transition of Care day 23  07/28/2017  Sarah Mckee April 30, 1946 [id number]  Successful telephone outreach with Sarah Mckee, 72 year old female referred to [facility] Community CM for transition of care after recent hospitalization February 9-12,2019 for COPD exacerbation and closed fracture of 7th thoracic vertebra.  Patient has history including, but not limited to acute respiratory failure,solitary pulmonary nodule, and arthritis.  HIPAA/ identity verified with patient during phone call today.  Explained that I was contacting patient for primary [facility] RN CM Rose Pierzchala.   Today, patient reports that she "is doing much better," although she continues to experience chronic pain related to recent thoracic vertebral fracture.  Reports that she continues primarily using tylenol for pain; reports has not needed additional "stronger" medication "since last Thursday."  Patient sounds to be in no obvious/ apparent distress throughout today's phone call.  Patient further reports:  -- has all medications and takes as prescribed; denies questions/ concerns around current medications -- home health services "going fine;" states HH PT visted this week, HH RN scheduled to visit tomorrow -- "leg swelling a little better," but "still swollen." Reports elevating lower extremities whenever she can; reports can "now wear socks, which is helping." -- denies new/ recent falls; reports continues using walker for ambulation as indicated -- previously reported cough is "now gone;" states that she continues having sputum "mostly in the morning only;" reports sputum "is clearing up, is mostly white" in color -- using rescue inhaler regularly "every morning" but reports no other use of rescue medication throughout day; reports continues using nebulizer several times daily  Patient denies further issues, concerns, or problems today.  Encouraged  patient to promptly notify care providers should any concerns, problems, or issues arise, and patient verbalizes understanding and agreement.  Discussed with patient that I would update [facility] Community CM primary RN CM Rose on our conversation today, and that Rose would contact patient again next week for ongoing transition of care; patient verbalized understanding and agreement with this plan.  Plan:  Will update patient's primary [facility] RN CM of today's successful telephone outreach to patient  [facility] CM Care Plan Problem One     Most Recent Value  Care Plan Problem One  Risk for readmission related to recent hospitalization for COPD exacerbation, closed wedge compression fracture of 5th thoracic vertebra   Role Documenting the Problem One  Care Management Coordinator  Care Plan for Problem One  Active  [facility] Long Term Goal   Pt would not readmit to the hospital within the next 31 days   [facility] Long Term Goal Start Date  07/09/17  Interventions for Problem One Long Term Goal  Discussed with patient current clinical status,  progression with home health services,  use of home O2  [facility] CM Short Term Goal #1   Pt would take all medications for the next 30 days   [facility] CM Short Term Goal #1 Start Date  07/09/17  Interventions for Short Term Goal #1  Discussed with patient use of rescue medications with COPD, reinforced medication adherence and confirmed that patient has all medications and is taking as prescribed  [facility] CM Short Term Goal #2   Pt would keep all MD appointments in the next 30 days   [facility] CM Short Term Goal #2 Start Date  07/09/17  Interventions for Short Term Goal #2  Confirmed with patient that she is planning to attend all upcoming provider appointments  Caryl PinaLaine Mckinney Tousey, RN, BSN, [employer] Community Care Coordinator [facility] Care Management  [phone]

## 2017-08-07 ENCOUNTER — Other Ambulatory Visit: Payer: Self-pay | Admitting: *Deleted

## 2017-08-07 NOTE — Patient Outreach (Signed)
Successful telephone encounter to Sarah Mckee, 72 year old female for transition of care/ongoing follow up on recent hospitalization February 9-12,2019 for COPD  Exacerbation, closed fracture of 7th thoracic vertebra.  Spoke with pt, HIPAA  Identifiers verified.  Pt reports doing better, pain (thoracic vertebra fx) is lessened, taking Tylenol, has not used prescription pain medication since last week.  Pt reports  Edema in legs is down, keeping legs more elevated now.   Pt reports sob when  Stressing.thinking about all the things she should be doing but cannot.  Pt  Reports had a little thing happen to her on 3/17, woke up and could not breath [facility] by the time she came out was better- stress.  Pt reports she is Taking all of her medications, includes nebulizer treatments/rescue inhaler. Pt  Reports continues on 2 liters [place], purse lip breathing helps sob.  Pt reports  HH PT discharged her, to see PCP next month plus needs to make an  Appointment with Eye MD.   RN CM discussed with pt plan to follow up again  Next week- final transition of care call.   Plan:  As discussed, plan to follow up again next week telephonically.   Shayne Alkenose M.   Murline Weigel RN CCM [facility] Care [facility]  [phone]

## 2017-08-10 ENCOUNTER — Other Ambulatory Visit: Payer: Self-pay | Admitting: *Deleted

## 2017-08-10 NOTE — Patient Outreach (Signed)
08/10/2017   Final transition of care call:  Successful telephone encounter to Sarah Mckee, 72 year old female for final transition of care call/ongoing follow up on recent hospitalization February 9-12,2019 for COPD exacerbation,closed fracture of seventh thoracic Vertebra.   Spoke with pt, HIPAA identifiers verified.  Pt  Reports no changes since talking to RN CM last week, doing better/no sob.  Pt reports still has trouble with her back (7th vertebra fracture), takes Time to heal, still just taking Tylenol for the pain.   Pt reports edema in  Legs still down, elevating on pillows helps. Pt reports to see PCP next  Month, first time to see MD since discharge- leg muscles weak, been  Walking back and forth in her home to strengthen.  Pt reports taking all of her medications which includes nebulizer treatments.  RN CM  Discussed with pt today final transition of care call, to follow up again  Next month telephonically check on clinical status to which pt agreed.  Plan:  As discussed with pt, plan to follow up again next month  Telephonically.     Shayne Alkenose M.   Pierzchala RN CCM [facility] Care [facility]  [phone]

## 2017-08-12 ENCOUNTER — Ambulatory Visit: Payer: PPO | Admitting: Internal Medicine

## 2017-08-14 DIAGNOSIS — J449 Chronic obstructive pulmonary disease, unspecified: Secondary | ICD-10-CM | POA: Diagnosis not present

## 2017-08-20 DIAGNOSIS — J449 Chronic obstructive pulmonary disease, unspecified: Secondary | ICD-10-CM | POA: Diagnosis not present

## 2017-09-10 ENCOUNTER — Ambulatory Visit: Payer: Self-pay | Admitting: *Deleted

## 2017-09-10 ENCOUNTER — Other Ambulatory Visit: Payer: Self-pay | Admitting: *Deleted

## 2017-09-10 DIAGNOSIS — J449 Chronic obstructive pulmonary disease, unspecified: Secondary | ICD-10-CM | POA: Diagnosis not present

## 2017-09-10 DIAGNOSIS — F320 Major depressive disorder, single episode, mild: Secondary | ICD-10-CM | POA: Diagnosis not present

## 2017-09-10 DIAGNOSIS — S22060D Wedge compression fracture of T7-T8 vertebra, subsequent encounter for fracture with routine healing: Secondary | ICD-10-CM | POA: Diagnosis not present

## 2017-09-10 DIAGNOSIS — R609 Edema, unspecified: Secondary | ICD-10-CM | POA: Diagnosis not present

## 2017-09-10 DIAGNOSIS — R0902 Hypoxemia: Secondary | ICD-10-CM | POA: Diagnosis not present

## 2017-09-10 DIAGNOSIS — M8000XA Age-related osteoporosis with current pathological fracture, unspecified site, initial encounter for fracture: Secondary | ICD-10-CM | POA: Diagnosis not present

## 2017-09-10 DIAGNOSIS — I272 Pulmonary hypertension, unspecified: Secondary | ICD-10-CM | POA: Diagnosis not present

## 2017-09-10 DIAGNOSIS — Z0000 Encounter for general adult medical examination without abnormal findings: Secondary | ICD-10-CM | POA: Diagnosis not present

## 2017-09-10 NOTE — Patient Outreach (Signed)
[facility]) Care Management  09/10/2017  Shann MedalJanet Mule [birth year] [id number]  Unsuccessful telephone encounter to Shann MedalJanet Wholey, 72 year old female- follow up on Current clinical status as this RN CM followed pt for transition of care/recent  Hospitalization February 9-12,2019 for COPD exacerbation, closed fracture of  Seventh thoracic vertebra.   Transition of care program completed 08/10/17. HIPAA compliant voice message left with RN CM's contact information,request  To return call.    Plan:  If no response, unsuccessful outreach letter to be sent to pt and if no  Response in 10 business days to close case.   RN CM to make another  Outreach call in 3 business days.   Shayne Alkenose M.   Pierzchala RN CCM [facility] Care Management  [phone]

## 2017-09-15 ENCOUNTER — Ambulatory Visit: Payer: Self-pay | Admitting: *Deleted

## 2017-09-15 ENCOUNTER — Other Ambulatory Visit: Payer: Self-pay | Admitting: *Deleted

## 2017-09-15 NOTE — Patient Outreach (Signed)
[facility]) Care Management  09/15/2017  Yesmin Mutch [birth year] [id number]   Second unsuccessful telephone encounter to Shann Medal, 72 year old female- follow up on current  Clinical status as this RN CM followed pt for transition of care/recent hospitalization February 9-12, 2019 for COPD exacerbation, closed fracture of seventh thoracic vertebra. Transition of care  Program completed 08/10/17.   HIPAA compliant voice message left with RN CM's contact information, Request to return call.    Plan:  If no response, plan to follow up again telephonically in 4 business days, if no response case  To be closed on 10 th day of when unsuccessful outreach letter sent.    Shayne Alken.   Pierzchala RN CCM [facility] Care Management  [phone]

## 2017-09-19 DIAGNOSIS — J449 Chronic obstructive pulmonary disease, unspecified: Secondary | ICD-10-CM | POA: Diagnosis not present

## 2017-09-21 ENCOUNTER — Other Ambulatory Visit: Payer: Self-pay | Admitting: *Deleted

## 2017-09-21 NOTE — Patient Outreach (Signed)
[facility]) Care Management  09/21/2017  Ryhanna Dunsmore [birth year] [id number]   Successful telephone encounter to Shann Medal, 72 year old female - follow up on current clinical status.   This RN CM followed pt for transition of care/recent hospitalization February 9-12,2019 for COPD  Exacerbation, closed fracture of seventh thoracic vertebra.   Transition of care program completed 08/10/17. Spoke with pt, HIPAA identifiers verified.  Pt reports doing good, fracture to vertebra healed.  Pt reports  COPD same, no change to sob, continues on 2 Liters [place], nebulizer treatments as needed- helping.  Pt  Reports has been avoiding going outdoors when hot/pollen.    Pt reports still has edema in legs/wearing  Stockings/elevating legs/cutting down on sodium.   Pt reports on recent PCP visit on 09/10/17, received  A good report.   RN CM discussed with pt plan  to transfer her case to another [facility] nurse - follow up next  Month telephonically- check on her clinical status to which pt was agreeable.    Plan:  As discussed with pt, to transfer her to another [facility] nurse who will be follow up next month  Telephonically.     Shayne Alken.   Pierzchala RN CCM [facility] Care Management  [phone]

## 2017-09-22 DIAGNOSIS — R0600 Dyspnea, unspecified: Secondary | ICD-10-CM | POA: Diagnosis not present

## 2017-09-22 DIAGNOSIS — J449 Chronic obstructive pulmonary disease, unspecified: Secondary | ICD-10-CM | POA: Diagnosis not present

## 2017-09-22 DIAGNOSIS — Z515 Encounter for palliative care: Secondary | ICD-10-CM | POA: Diagnosis not present

## 2017-10-13 ENCOUNTER — Ambulatory Visit: Payer: Self-pay | Admitting: *Deleted

## 2017-10-20 ENCOUNTER — Other Ambulatory Visit: Payer: Self-pay | Admitting: Internal Medicine

## 2017-10-20 DIAGNOSIS — J449 Chronic obstructive pulmonary disease, unspecified: Secondary | ICD-10-CM | POA: Diagnosis not present

## 2017-10-27 ENCOUNTER — Encounter: Payer: Self-pay | Admitting: *Deleted

## 2017-10-27 ENCOUNTER — Other Ambulatory Visit: Payer: Self-pay | Admitting: *Deleted

## 2017-10-27 NOTE — Patient Outreach (Addendum)
[facility]) Care Management  10/27/2017  Sarah Mckee May 26, 1945 [id number]  Telephone assessment call Initial outreach to recently assigned patient from prior case manager as per report plan for next outreach.    72 year old female - follow up on current clinical status.   This RN CM followed pt for transition of care/recent hospitalization February 9-12,2019 for COPD  Exacerbation, closed fracture of seventh thoracic vertebra.     Unsuccessful outreach call to patient , no answer able to leave a HIPAA compliant message requesting a return call.  Returned call from patient, HIPAA verified. Patient dicussed that she is being followed by Palliative care of . She discussed having recent episode of back pain compressed fracture and she placed a  call to [facility] her Palliative care nurse and she was able to help with getting her needed pain medication her husband is picking up prescription on today.   Patient discussed she continues to wear her oxygen at 2 liters, using nebulizer treatments as needed with relief.  Patient discussed she continues to limit salt and monitor swelling in legs which is up and down.   Patient denies any other new concerns at this time, declined any further [facility] need for involvement at this time, no home visit needed , offered  our [facility] telephone health coach program for continued follow up patient declined stating too many people calling will get her confused, she has been pleased with Palliative follow up.  Ensured patient has [facility] contact number for future needs. Patient agreeable to case closure.   Plan Will close case, will send PCP case closure letter goal met.     Joylene Draft, RN, [place] Management Coordinator  [phone]- Mobile [phone]- Toll Free Main Office

## 2017-10-30 ENCOUNTER — Ambulatory Visit: Payer: PPO | Admitting: *Deleted

## 2017-11-02 ENCOUNTER — Ambulatory Visit: Payer: PPO | Admitting: Internal Medicine

## 2017-11-19 DIAGNOSIS — J449 Chronic obstructive pulmonary disease, unspecified: Secondary | ICD-10-CM | POA: Diagnosis not present

## 2017-12-20 DIAGNOSIS — J449 Chronic obstructive pulmonary disease, unspecified: Secondary | ICD-10-CM | POA: Diagnosis not present

## 2017-12-31 ENCOUNTER — Encounter: Payer: Self-pay | Admitting: Internal Medicine

## 2017-12-31 ENCOUNTER — Ambulatory Visit: Payer: PPO | Admitting: Internal Medicine

## 2017-12-31 VITALS — BP 162/86 | HR 94 | Ht 62.0 in | Wt 184.0 lb

## 2017-12-31 DIAGNOSIS — J449 Chronic obstructive pulmonary disease, unspecified: Secondary | ICD-10-CM

## 2017-12-31 DIAGNOSIS — J9611 Chronic respiratory failure with hypoxia: Secondary | ICD-10-CM | POA: Diagnosis not present

## 2017-12-31 MED ORDER — FLUTICASONE FUROATE-VILANTEROL 200-25 MCG/INH IN AEPB: 1.0000 | INHALATION_SPRAY | Freq: Every day | RESPIRATORY_TRACT | 5 refills | Status: DC

## 2017-12-31 NOTE — Progress Notes (Signed)
Sarah Mckee      MRN# [id number] Sarah MedalJanet Mckee 15-Mar-1946   CC: Chief Complaint  Patient presents with  . Follow-up    SOB w/activity: dry cough   HPI Patient has had multiple compression fractures of spine over last 6 months Severe debilitating resp disease  Chronic SOB/DOE On oxygen severe hypoxia with exertion-needs oxygen to survive Uses inhalers as prescribed No signs of infection at this time No signs of acute heart failure at this time Patient with morbid obesity and deconditioned state  Chronic EDEMA   Medication:    Current Outpatient Medications:  .  acetaminophen (TYLENOL) 500 MG tablet, Take 500-1,000 mg by mouth See admin instructions. Take 500 mg by mouth in the morning/afternoon and take 1000 mg by mouth in the evening, Disp: , Rfl:  .  albuterol (PROVENTIL) (2.5 MG/3ML) 0.083% nebulizer solution, Take 3 mLs (2.5 mg total) by nebulization every 6 (six) hours as needed for wheezing or shortness of breath., Disp: 360 mL, Rfl: 6 .  ALPRAZolam (XANAX) 0.25 MG tablet, Take 0.25 mg by mouth 2 (two) times daily as needed for anxiety. , Disp: , Rfl:  .  aluminum-magnesium hydroxide-simethicone (MAALOX) 200-200-20 MG/5ML SUSP, Take 30 mLs by mouth 4 (four) times daily -  before meals and at bedtime. (Patient taking differently: Take 30 mLs by mouth 4 (four) times daily as needed (for indegestion). ), Disp: 355 mL, Rfl: 0 .  ASPERCREME LIDOCAINE EX, Apply 1 application topically as needed (for hand/shoulder pain)., Disp: , Rfl:  .  aspirin 81 MG tablet, Take 81 mg by mouth daily., Disp: , Rfl:  .  BREO ELLIPTA 200-25 MCG/INH AEPB, INHALE 1 PUFF BY MOUTH ONCE DAILY, Disp: 60 each, Rfl: 5 .  budesonide (PULMICORT) 0.5 MG/2ML nebulizer solution, Inhale 2 mLs into the lungs 2 (two) times daily., Disp: , Rfl:  .  Cholecalciferol (VITAMIN D) 400 UNIT/ML LIQD, Take by mouth. Pt taking pill form,   2 tablets daily, Disp: , Rfl:  .  cyclobenzaprine  (FLEXERIL) 5 MG tablet, Take 5 mg by mouth 3 (three) times daily as needed., Disp: , Rfl:  .  furosemide (LASIX) 40 MG tablet, Take 40 mg by mouth daily as needed for fluid., Disp: , Rfl:  .  HYDROmorphone (DILAUDID) 2 MG tablet, Take 1 tablet (2 mg total) by mouth every 6 (six) hours as needed for moderate pain or severe pain., Disp: 15 tablet, Rfl: 0 .  montelukast (SINGULAIR) 10 MG tablet, Take 10 mg by mouth daily., Disp: , Rfl:  .  OXYGEN, Inhale into the lungs continuous., Disp: , Rfl:  .  Polyethyl Glycol-Propyl Glycol (SYSTANE OP), Place 1 drop into both eyes daily., Disp: , Rfl:  .  potassium chloride SA (K-DUR,KLOR-CON) 20 MEQ tablet, Take 20 mEq by mouth daily. , Disp: , Rfl:  .  predniSONE (DELTASONE) 10 MG tablet, Label  & dispense according to the schedule below. 5 Pills PO for 1 day then, 4 Pills PO for 1 day, 3 Pills PO for 1 day, 2 Pills PO for 1 day, 1 Pill PO for 1 days then STOP., Disp: 15 tablet, Rfl: 0 .  PROAIR HFA 108 (90 Base) MCG/ACT inhaler, Inhale 2 puffs into the lungs every 4 (four) hours as needed for wheezing or shortness of breath. , Disp: , Rfl:  .  ranitidine (ZANTAC) 150 MG capsule, Take 1 capsule (150 mg total) by mouth 2 (two) times daily. (Patient taking differently: Take 150 mg by  mouth daily. ), Disp: 28 capsule, Rfl: 0 .  SPIRIVA RESPIMAT 2.5 MCG/ACT AERS, INHALE 2 PUFFS INTO THE LUNGS BY DAILY, Disp: 4 g, Rfl: 3 .  triamterene-hydrochlorothiazide (MAXZIDE-25) 37.5-25 MG tablet, Take 1 tablet by mouth daily., Disp: , Rfl:     Review of Systems  Constitutional: Negative for chills, fever and malaise/fatigue.  Eyes: Negative for blurred vision.  Respiratory: Positive for shortness of breath. Negative for cough, sputum production and wheezing.   Cardiovascular: Negative for chest pain.  Gastrointestinal: Negative for heartburn, nausea and vomiting.  Genitourinary: Negative for dysuria.  Musculoskeletal: Negative for myalgias.  Neurological: Negative for  headaches.  Endo/Heme/Allergies: Does not bruise/bleed easily.      Allergies:  Codeine; Demerol [meperidine]; Morphine and related; Oxycodone; Alendronate; Amoxicillin-pot clavulanate; Risedronate; and Shellfish allergy  Physical Examination:  VS: BP (!) 162/86 (BP Location: Left Arm, Cuff Size: Normal)   Pulse 94   Ht 5\' 2"  (1.575 m)   Wt 184 lb (83.5 kg)   SpO2 95%   BMI 33.65 kg/m   General Appearance: No distress  HEENT: PERRLA, no ptosis, no other lesions noticed Pulmonary:normal breath sounds., diaphragmatic excursion normal.No wheezing, No rales   Cardiovascular:  Normal S1,S2.  No m/r/g.     Abdomen:Exam: Benign, Soft, non-tender, No masses  Skin:   warm, no rashes, no ecchymosis  Extremities: normal, no cyanosis, clubbing, warm with normal capillary refill.      Rad results:  CT chest 07/3016 I have Independently reviewed images of  CT chest    Interpretation: resolution of LLL nodule   6MWT -  Walked about 144meters and then desaturated to 71%, test stopped and patient placed on 2L O2.       Assessment and Plan:  72 year old female follow up for severe end stage COPD Gold Stage D with chronic hypoxic resp failure in the setting of morbid obesity and deconditioned state  1.Solitary pulmonary nodule-LLL-resolved 9mm subpleural nodule/scarring on the LLL. Review of CT Chest in 11/2014 showed a similar finding, probably 627mm-8mm. Most likely a scarring on imaging-but has  completely resolved.  2.COPD with hypoxia (HCC) END STAGE Classification is severe, stage IV. FEV1 24%, FEV1/FVC 35% Overall management with supplemental oxygen continuously, Spiriva, albuterol as needed -continue breo 200 and spiriva respimat 2.5  3.Chronic respiratory failure (HCC) On 2 L of supplemental oxygen continuously Patient benefiting and using oxygen therapy 2L [place] with exertion and at night Ambulating pulse oximetry on RA declined to 70%  4. Obesity -recommend significant  weight loss -recommend changing diet  5.Deconditioned state/debiltates state Activity as tolerated  Recommend Referral to Hospice  Patient satisfied with Plan of action and management. All questions answered Follow up in 6 months  Sarah Mckee Santiago Gladavid Antonino Nienhuis, M.D.  [facility]  Medical Director [facility] Medical Director [facility] Card[facility]

## 2017-12-31 NOTE — Patient Instructions (Signed)
Continue inhalers as prescribed Continue oxygen as prescribed  

## 2018-01-14 DIAGNOSIS — S22060D Wedge compression fracture of T7-T8 vertebra, subsequent encounter for fracture with routine healing: Secondary | ICD-10-CM | POA: Diagnosis not present

## 2018-01-14 DIAGNOSIS — M8000XA Age-related osteoporosis with current pathological fracture, unspecified site, initial encounter for fracture: Secondary | ICD-10-CM | POA: Diagnosis not present

## 2018-01-14 DIAGNOSIS — I10 Essential (primary) hypertension: Secondary | ICD-10-CM | POA: Diagnosis not present

## 2018-01-14 DIAGNOSIS — E782 Mixed hyperlipidemia: Secondary | ICD-10-CM | POA: Diagnosis not present

## 2018-01-14 DIAGNOSIS — J449 Chronic obstructive pulmonary disease, unspecified: Secondary | ICD-10-CM | POA: Diagnosis not present

## 2018-01-14 DIAGNOSIS — F3341 Major depressive disorder, recurrent, in partial remission: Secondary | ICD-10-CM | POA: Diagnosis not present

## 2018-01-14 DIAGNOSIS — R7309 Other abnormal glucose: Secondary | ICD-10-CM | POA: Diagnosis not present

## 2018-01-18 DIAGNOSIS — J449 Chronic obstructive pulmonary disease, unspecified: Secondary | ICD-10-CM | POA: Diagnosis not present

## 2018-01-20 DIAGNOSIS — J449 Chronic obstructive pulmonary disease, unspecified: Secondary | ICD-10-CM | POA: Diagnosis not present

## 2018-02-12 DIAGNOSIS — H2511 Age-related nuclear cataract, right eye: Secondary | ICD-10-CM | POA: Diagnosis not present

## 2018-02-19 DIAGNOSIS — J449 Chronic obstructive pulmonary disease, unspecified: Secondary | ICD-10-CM | POA: Diagnosis not present

## 2018-03-19 ENCOUNTER — Other Ambulatory Visit: Payer: Self-pay | Admitting: Internal Medicine

## 2018-03-19 MED ORDER — ALBUTEROL SULFATE (2.5 MG/3ML) 0.083% IN NEBU: INHALATION_SOLUTION | RESPIRATORY_TRACT | 5 refills | Status: AC

## 2018-03-22 DIAGNOSIS — J449 Chronic obstructive pulmonary disease, unspecified: Secondary | ICD-10-CM | POA: Diagnosis not present

## 2018-04-21 DIAGNOSIS — J449 Chronic obstructive pulmonary disease, unspecified: Secondary | ICD-10-CM | POA: Diagnosis not present

## 2018-05-22 DIAGNOSIS — J449 Chronic obstructive pulmonary disease, unspecified: Secondary | ICD-10-CM | POA: Diagnosis not present

## 2018-06-22 DIAGNOSIS — J449 Chronic obstructive pulmonary disease, unspecified: Secondary | ICD-10-CM | POA: Diagnosis not present

## 2018-06-30 ENCOUNTER — Ambulatory Visit: Payer: PPO | Admitting: Internal Medicine

## 2018-06-30 ENCOUNTER — Telehealth: Payer: Self-pay | Admitting: Internal Medicine

## 2018-06-30 ENCOUNTER — Encounter: Payer: Self-pay | Admitting: Internal Medicine

## 2018-06-30 VITALS — BP 138/80 | HR 81 | Ht 62.0 in | Wt 181.4 lb

## 2018-06-30 DIAGNOSIS — J440 Chronic obstructive pulmonary disease with acute lower respiratory infection: Secondary | ICD-10-CM | POA: Diagnosis not present

## 2018-06-30 DIAGNOSIS — J9611 Chronic respiratory failure with hypoxia: Secondary | ICD-10-CM | POA: Diagnosis not present

## 2018-06-30 DIAGNOSIS — Z23 Encounter for immunization: Secondary | ICD-10-CM | POA: Diagnosis not present

## 2018-06-30 DIAGNOSIS — J209 Acute bronchitis, unspecified: Secondary | ICD-10-CM

## 2018-06-30 MED ORDER — PREDNISONE 20 MG PO TABS: 40.0000 mg | ORAL_TABLET | Freq: Every day | ORAL | 1 refills | Status: DC

## 2018-06-30 NOTE — Progress Notes (Signed)
Sarah Mckee      MRN# [id number] Sarah Mckee 01-21-1946   CC: follow up COPD  HPI  Has multiple compression fractures of spine Severe debilitating resp disease due to COPD  Chronic SOB and DOE +wheezing Needs oxygen to survive  Patient can NOT use inhalers at this time due to very poor resp insufficieny  No signs of infection at this time No acute resp distress at this time       Medication:    Current Outpatient Medications:  .  acetaminophen (TYLENOL) 500 MG tablet, Take 500-1,000 mg by mouth See admin instructions. Take 500 mg by mouth in the morning/afternoon and take 1000 mg by mouth in the evening, Disp: , Rfl:  .  albuterol (PROVENTIL) (2.5 MG/3ML) 0.083% nebulizer solution, USE 1 VIAL IN NEBULIZER EVERY 6 HOURS AS NEEDED FOR WHEEZING OR SHORTNESS OF BREATH, Disp: 360 mL, Rfl: 5 .  ALPRAZolam (XANAX) 0.25 MG tablet, Take 0.25 mg by mouth 2 (two) times daily as needed for anxiety. , Disp: , Rfl:  .  aluminum-magnesium hydroxide-simethicone (MAALOX) 200-200-20 MG/5ML SUSP, Take 30 mLs by mouth 4 (four) times daily -  before meals and at bedtime. (Patient taking differently: Take 30 mLs by mouth 4 (four) times daily as needed (for indegestion). ), Disp: 355 mL, Rfl: 0 .  ASPERCREME LIDOCAINE EX, Apply 1 application topically as needed (for hand/shoulder pain)., Disp: , Rfl:  .  aspirin 81 MG tablet, Take 81 mg by mouth daily., Disp: , Rfl:  .  budesonide (PULMICORT) 0.5 MG/2ML nebulizer solution, Inhale 2 mLs into the lungs 2 (two) times daily., Disp: , Rfl:  .  Cholecalciferol (VITAMIN D) 400 UNIT/ML LIQD, Take by mouth. Pt taking pill form,   2 tablets daily, Disp: , Rfl:  .  cyclobenzaprine (FLEXERIL) 5 MG tablet, Take 5 mg by mouth 3 (three) times daily as needed., Disp: , Rfl:  .  fluticasone furoate-vilanterol (BREO ELLIPTA) 200-25 MCG/INH AEPB, Inhale 1 puff into the lungs daily., Disp: 60 each, Rfl: 5 .  furosemide (LASIX) 40 MG  tablet, Take 40 mg by mouth daily as needed for fluid., Disp: , Rfl:  .  HYDROmorphone (DILAUDID) 2 MG tablet, Take 1 tablet (2 mg total) by mouth every 6 (six) hours as needed for moderate pain or severe pain., Disp: 15 tablet, Rfl: 0 .  montelukast (SINGULAIR) 10 MG tablet, Take 10 mg by mouth daily., Disp: , Rfl:  .  OXYGEN, Inhale into the lungs continuous., Disp: , Rfl:  .  Polyethyl Glycol-Propyl Glycol (SYSTANE OP), Place 1 drop into both eyes daily., Disp: , Rfl:  .  potassium chloride SA (K-DUR,KLOR-CON) 20 MEQ tablet, Take 20 mEq by mouth daily. , Disp: , Rfl:  .  predniSONE (DELTASONE) 10 MG tablet, Label  & dispense according to the schedule below. 5 Pills PO for 1 day then, 4 Pills PO for 1 day, 3 Pills PO for 1 day, 2 Pills PO for 1 day, 1 Pill PO for 1 days then STOP., Disp: 15 tablet, Rfl: 0 .  PROAIR HFA 108 (90 Base) MCG/ACT inhaler, Inhale 2 puffs into the lungs every 4 (four) hours as needed for wheezing or shortness of breath. , Disp: , Rfl:  .  ranitidine (ZANTAC) 150 MG capsule, Take 1 capsule (150 mg total) by mouth 2 (two) times daily. (Patient taking differently: Take 150 mg by mouth daily. ), Disp: 28 capsule, Rfl: 0 .  SPIRIVA RESPIMAT 2.5 MCG/ACT AERS, INHALE  2 PUFFS INTO THE LUNGS BY DAILY, Disp: 4 g, Rfl: 3 .  triamterene-hydrochlorothiazide (MAXZIDE-25) 37.5-25 MG tablet, Take 1 tablet by mouth daily., Disp: , Rfl:     Review of Systems  Constitutional: Negative for chills, fever and malaise/fatigue.  Eyes: Negative for blurred vision.  Respiratory: Positive for shortness of breath. Negative for cough, sputum production and wheezing.   Cardiovascular: Negative for chest pain.  Gastrointestinal: Negative for heartburn, nausea and vomiting.  Genitourinary: Negative for dysuria.  Musculoskeletal: Negative for myalgias.  Neurological: Negative for headaches.  Endo/Heme/Allergies: Does not bruise/bleed easily.      Allergies:  Codeine; Demerol [meperidine];  Morphine and related; Oxycodone; Alendronate; Amoxicillin-pot clavulanate; Risedronate; and Shellfish allergy  Physical Examination:  VS: Ht 5\' 2"  (1.575 m)   Wt 181 lb 6.4 oz (82.3 kg)   BMI 33.18 kg/m   Physical Examination:   GENERAL:NAD, no fevers, chills, no weakness no fatigue HEAD: Normocephalic, atraumatic.  EYES: PERLA, EOMI No scleral icterus.  MOUTH: Moist mucosal membrane.  EAR, NOSE, THROAT: Clear without exudates. No external lesions.  NECK: Supple. No thyromegaly.  No JVD.  PULMONARY: CTA B/L +wheezing, rhonchi, crackles CARDIOVASCULAR: S1 and S2. Regular rate and rhythm. No murmurs GASTROINTESTINAL: Soft, nontender, nondistended. Positive bowel sounds.  MUSCULOSKELETAL: No swelling, clubbing, or edema.  NEUROLOGIC: No gross focal neurological deficits. 5/5 strength all extremities SKIN: No ulceration, lesions, rashes, or cyanosis.  PSYCHIATRIC: Insight, judgment intact. -depression -anxiety ALL OTHER ROS ARE NEGATIVE       Rad results:  CT chest 07/3016 I have Independently reviewed images of  CT chest    Interpretation: resolution of LLL nodule   -  Walked about and then desaturated to 71%, test stopped and patient placed on 2L O2.       Assessment and Plan:   Follow up visit for severe end stage COPD Gold Stage D with chronic hypoxic resp failure with morbid obesity and deconditioned state with h/o abnormal CT chest   Abnromal CT chest Pulmonary nodules have resolved with time  COPD-end stage Chronic SOB and DOE stable Continue inhalers as prescribed BREO and SPiriva are NO longer an option for patient  She will need NEBULIZED THERAPY from now on Will prescribed PULMICORT NEB BID and LABA NEB ADVISED-Please call you insurance company and obtain copy of your medication formulary   This will help your Pulmonologist to prescribe the most cost effective medications that your insurance company allows.   WHEEZING I have prescribed  prednisone 20 mg and I have instructed patient that if cough or wheezing gets worse, she can fill prescription.  FLU SHOT TO BE GIVEN TODAY  chronic hypoxic resp failure from COPD Needs oxygen to survive Needs oxygen with exertion and at night She uses and benefits from therapy   Obesity -recommend significant weight loss -recommend changing diet  Deconditioned state -Recommend increased daily activity and exercise     Patient  satisfied with Plan of action and management. All questions answered Follow up in 6 months  Tiena Manansala Santiago Glad, M.D.  [facility]  Medical Director [facility] Medical Director [facility] Cardio-Pulmonary Department

## 2018-06-30 NOTE — Patient Instructions (Addendum)
PULMICORT(BUDESONIDE) TREATMENT TWICE DAILY ALBUTEROL TREATMENT THREE TIMES DAILY   USE PREDNISONE IF WHEEZING GETS WORSE

## 2018-06-30 NOTE — Telephone Encounter (Signed)
rx corrected and resent. #20 with 1 refill on prednisone.

## 2018-06-30 NOTE — Telephone Encounter (Signed)
Message routed to [facility]. 

## 2018-07-03 ENCOUNTER — Other Ambulatory Visit: Payer: Self-pay | Admitting: Internal Medicine

## 2018-07-06 ENCOUNTER — Other Ambulatory Visit: Payer: Self-pay | Admitting: Internal Medicine

## 2018-07-06 NOTE — Telephone Encounter (Signed)
She can only take NEB therapy at this time

## 2018-07-07 ENCOUNTER — Other Ambulatory Visit: Payer: Self-pay | Admitting: Internal Medicine

## 2018-07-07 MED ORDER — TIOTROPIUM BROMIDE MONOHYDRATE 2.5 MCG/ACT IN AERS: INHALATION_SPRAY | RESPIRATORY_TRACT | 3 refills | Status: AC

## 2018-07-07 NOTE — Telephone Encounter (Signed)
Refill sent to patient pharmacy.

## 2018-07-07 NOTE — Telephone Encounter (Signed)
Has multiple compression fractures of spine Severe debilitating resp disease due to COPD  Pt states she can use Spiriva and is requesting a refill. She states she can take deep breaths in and feel neb therapy is wearing her out. Advise on refilling Spiriva.

## 2018-07-07 NOTE — Telephone Encounter (Signed)
Please refill Spiriva

## 2018-07-14 DIAGNOSIS — F320 Major depressive disorder, single episode, mild: Secondary | ICD-10-CM | POA: Diagnosis not present

## 2018-07-14 DIAGNOSIS — M810 Age-related osteoporosis without current pathological fracture: Secondary | ICD-10-CM | POA: Diagnosis not present

## 2018-07-14 DIAGNOSIS — Z0000 Encounter for general adult medical examination without abnormal findings: Secondary | ICD-10-CM | POA: Diagnosis not present

## 2018-07-14 DIAGNOSIS — J449 Chronic obstructive pulmonary disease, unspecified: Secondary | ICD-10-CM | POA: Diagnosis not present

## 2018-07-14 DIAGNOSIS — R609 Edema, unspecified: Secondary | ICD-10-CM | POA: Diagnosis not present

## 2018-07-14 DIAGNOSIS — I272 Pulmonary hypertension, unspecified: Secondary | ICD-10-CM | POA: Diagnosis not present

## 2018-07-14 DIAGNOSIS — I10 Essential (primary) hypertension: Secondary | ICD-10-CM | POA: Diagnosis not present

## 2018-07-21 DIAGNOSIS — J449 Chronic obstructive pulmonary disease, unspecified: Secondary | ICD-10-CM | POA: Diagnosis not present

## 2018-07-22 ENCOUNTER — Other Ambulatory Visit: Payer: Self-pay | Admitting: Internal Medicine

## 2018-07-22 DIAGNOSIS — J449 Chronic obstructive pulmonary disease, unspecified: Secondary | ICD-10-CM

## 2018-07-26 ENCOUNTER — Telehealth: Payer: Self-pay | Admitting: Internal Medicine

## 2018-07-26 ENCOUNTER — Other Ambulatory Visit: Payer: Self-pay | Admitting: *Deleted

## 2018-07-26 ENCOUNTER — Other Ambulatory Visit: Payer: Self-pay | Admitting: Internal Medicine

## 2018-07-26 DIAGNOSIS — J449 Chronic obstructive pulmonary disease, unspecified: Secondary | ICD-10-CM

## 2018-07-26 MED ORDER — FLUTICASONE FUROATE-VILANTEROL 200-25 MCG/INH IN AEPB: 1.0000 | INHALATION_SPRAY | Freq: Every day | RESPIRATORY_TRACT | 1 refills | Status: AC

## 2018-07-26 NOTE — Telephone Encounter (Signed)
Refill submitted. 

## 2018-07-26 NOTE — Telephone Encounter (Signed)
error 

## 2018-08-21 DIAGNOSIS — J449 Chronic obstructive pulmonary disease, unspecified: Secondary | ICD-10-CM | POA: Diagnosis not present

## 2018-08-31 ENCOUNTER — Telehealth: Payer: Self-pay | Admitting: Primary Care

## 2018-08-31 NOTE — Telephone Encounter (Signed)
T/c to patient to set up palliative medicine consultation visit. No answer, message left for f/u to schedule.

## 2018-09-20 DIAGNOSIS — J449 Chronic obstructive pulmonary disease, unspecified: Secondary | ICD-10-CM | POA: Diagnosis not present

## 2018-10-21 DIAGNOSIS — J449 Chronic obstructive pulmonary disease, unspecified: Secondary | ICD-10-CM | POA: Diagnosis not present

## 2018-10-29 ENCOUNTER — Other Ambulatory Visit: Payer: Self-pay

## 2018-10-29 ENCOUNTER — Other Ambulatory Visit: Payer: PPO | Admitting: Primary Care

## 2018-10-29 DIAGNOSIS — Z515 Encounter for palliative care: Secondary | ICD-10-CM

## 2018-10-29 NOTE — Progress Notes (Signed)
[facility] Palliative Care Consult Note Telephone: [phone]  Fax: [phone]  TELEHEALTH VISIT STATEMENT Due to the COVID-19 crisis, this visit was done via telemedicine from my office. It was initiated and consented to by this patient and/or family.  PATIENT NAME: Sarah Mckee DOB: 12/07/1945 MRN: [id number]  PRIMARY CARE PROVIDER:   Marinda Elk, [place]  REFERRING PROVIDER:  Marinda Elk, MD [address] [place],  [place] [address] [phone]  RESPONSIBLE PARTY:   Extended Emergency Contact Information Primary Emergency Contact: Leaton,Larry B Address: [place], [place] [address] [place] Phone: [phone] Relation: Spouse Secondary Emergency Contact: [place], [place] [address] [place] Phone: [phone] Relation: Daughter  Palliative Care was asked to follow patient by consultation request of Marinda Elk, MD. This is a follow up visit. Present on the call were patient, myself, pt husband and daughter.  ASSESSMENT AND RECOMMENDATIONS:   1. Goals of Care: Maximize quality of life and symptom management. Poor quality currently due to severe DOE.  2. Symptom Management:  DOE and SOB: Recommended to take albuterol nebulizer and budesonide in the am, then Spiriva, take albuterol nebulizer q 4 hrs during the day, take albuterol and budesonide at hs.  On Monday we will review efficacy and ability to self administer inhalers. Patient is currently not taking a LABA and intermittently and sporadically taking SABA. Endorses severe DOE.  In our initial interview, DOE and SOB were seen as paramount in QOL. We reviewed her current respiratory medications. She has ceased taking the [facility], saying she thought it didn't help due to powder in her mouth, takes Spiriva daily and is taking approx. 3 inhaled  albuterol treatments as well as prn albuterol inhaler use. She also has nebulized budesonide which she is taking 1-2 x/ day. She takes nebulizers sporadically due to DOE during and after treatment.   Using oxygen 2.5 l/ [place] with reported exercise intolerance but is able to conduct interview with minimal breathlessness. Pulse oximetry still registers 97% but patient is experiencing extreme breathlessness but falls to 70's if off oxygen and ambulating. Education provided as to nature of palliative goals e.g. comfort, and maximization of exercise tolerance.  Pain: Azithromycin 500 mg today, 250 mg x 4 days po. Called to [employer]. Endorses maxillary sinus pain and post nasal drainage x > 2 weeks. I will treat for sinusitis with antibiotic patient has used in the past with good effect and tolerated well. Allergies reviewed.  3. Family Supports: Daughter Ollen Gross is caregiver, husband also in home. Both pt and husband have many medical problems. Husband can assist with medications, warming up meals; Altha Harm does personal care and most IADLs  4. Cognitive / Functional decline: Intact. Alert and oriented x 3, good historian.  5. Advanced Care Directive: Did not discuss due to the lengthy nature of medication review and teaching. I will broach DNR and MOST directives on next visit.  6. Follow up Palliative Care Visit: Palliative care will continue to follow for goals of care clarification and symptom management. Return 3 days or prn.  I spent 75 minutes providing this consultation,  from 1230 to 1345. More than 50% of the time in this consultation was spent coordinating communication.   HISTORY OF PRESENT ILLNESS:  Sarah Mckee is a 73 y.o.  year old female with multiple medical problems including COPD, PH, OA, lymphedema. Palliative Care was asked to help address goals of care.   CODE STATUS: TBD  PPS: 40% HOSPICE ELIGIBILITY/DIAGNOSIS: TBD  PAST MEDICAL HISTORY:  Past Medical  History:  Diagnosis Date  . Arthritis   . COPD (chronic obstructive pulmonary disease) (HCC)    2L 02 at baseline  . Hypertension   . Lymphedema of left leg   . Lymphedema of right lower extremity   . Pulmonary HTN (HCC)     SOCIAL HX:  Social History   Tobacco Use  . Smoking status: Former Smoker    Quit date: 05/20/2007    Years since quitting: 11.4  . Smokeless tobacco: Never Used  Substance Use Topics  . Alcohol use: No    ALLERGIES:  Allergies  Allergen Reactions  . Codeine Shortness Of Breath  . Demerol [Meperidine] Shortness Of Breath  . Morphine And Related Shortness Of Breath  . Oxycodone Shortness Of Breath  . Alendronate Other (See Comments)    Other reaction(s): Other (See Comments) esophagitis  . Amoxicillin-Pot Clavulanate Other (See Comments)    Unknown Has patient had a PCN reaction causing immediate rash, facial/tongue/throat swelling, SOB or lightheadedness with hypotension: Unknown Has patient had a PCN reaction causing severe rash involving mucus membranes or skin necrosis: Unknown Has patient had a PCN reaction that required hospitalization: Unknown Has patient had a PCN reaction occurring within the last 10 years: Unknown If all of the above answers are "NO", then may proceed with Cephalosporin use.   Marland Kitchen. Risedronate Other (See Comments)    Other reaction(s): Other (See Comments) epigastric pain  . Shellfish Allergy Nausea And Vomiting     PERTINENT MEDICATIONS:  Outpatient Encounter Medications as of 10/29/2018  Medication Sig  . acetaminophen (TYLENOL) 500 MG tablet Take 500-1,000 mg by mouth See admin instructions. Take 500 mg by mouth in the morning/afternoon and take 1000 mg by mouth in the evening  . albuterol (PROVENTIL) (2.5 MG/3ML) 0.083% nebulizer solution USE 1 VIAL IN NEBULIZER EVERY 6 HOURS AS NEEDED FOR WHEEZING OR SHORTNESS OF BREATH  . ALPRAZolam (XANAX) 0.25 MG tablet Take 0.25 mg by mouth 2 (two) times daily as needed for anxiety.    Marland Kitchen. aluminum-magnesium hydroxide-simethicone (MAALOX) 200-200-20 MG/5ML SUSP Take 30 mLs by mouth 4 (four) times daily -  before meals and at bedtime. (Patient taking differently: Take 30 mLs by mouth 4 (four) times daily as needed (for indegestion). )  . ASPERCREME LIDOCAINE EX Apply 1 application topically as needed (for hand/shoulder pain).  Marland Kitchen. aspirin 81 MG tablet Take 81 mg by mouth daily.  . budesonide (PULMICORT) 0.5 MG/2ML nebulizer solution Inhale 2 mLs into the lungs 2 (two) times daily.  . Cholecalciferol (VITAMIN D) 400 UNIT/ML LIQD Take by mouth. Pt taking pill form,   2 tablets daily  . cyclobenzaprine (FLEXERIL) 5 MG tablet Take 5 mg by mouth 3 (three) times daily as needed.  . fluticasone furoate-vilanterol (BREO ELLIPTA) 200-25 MCG/INH AEPB Inhale 1 puff into the lungs daily.  . furosemide (LASIX) 40 MG tablet Take 40 mg by mouth daily as needed for fluid.  Marland Kitchen. HYDROmorphone (DILAUDID) 2 MG tablet Take 1 tablet (2 mg total) by mouth every 6 (six) hours as needed for moderate pain or severe pain.  . montelukast (SINGULAIR) 10 MG tablet Take 10 mg by mouth daily.  . OXYGEN Inhale into the lungs continuous.  Bertram Gala. Polyethyl Glycol-Propyl Glycol (SYSTANE OP) Place 1 drop  into both eyes daily.  . potassium chloride SA (K-DUR,KLOR-CON) 20 MEQ tablet Take 20 mEq by mouth daily.   . predniSONE (DELTASONE) 10 MG tablet Label  & dispense according to the schedule below. 5 Pills PO for 1 day then, 4 Pills PO for 1 day, 3 Pills PO for 1 day, 2 Pills PO for 1 day, 1 Pill PO for 1 days then STOP.  Marland Kitchen. predniSONE (DELTASONE) 20 MG tablet Take 2 tablets (40 mg total) by mouth daily with breakfast. 10 days  . PROAIR HFA 108 (90 Base) MCG/ACT inhaler Inhale 2 puffs into the lungs every 4 (four) hours as needed for wheezing or shortness of breath.   . Tiotropium Bromide Monohydrate (SPIRIVA RESPIMAT) 2.5 MCG/ACT AERS INHALE 2 PUFFS INTO THE LUNGS BY DAILY  . triamterene-hydrochlorothiazide (MAXZIDE-25) 37.5-25  MG tablet Take 1 tablet by mouth daily.   No facility-administered encounter medications on file as of 10/29/2018.     PHYSICAL EXAM/ROS:   Current and past weights: 181 lb in 2/ 2020. 184 lb in 12/2017. General: NAD, frail appearing, obese Cardiovascular: no chest pain reported, no edema reported Pulmonary: + productive cough, endorses DOE and SOB at rest Abdomen: appetite fair, endorses constipation, continent of bowel GU: denies dysuria, incontinent of urine on occasion MSK:  no joint deformities, endorses weakness, deconditioning Skin: no rashes or wounds reported Neurological: Weakness, good historian, A and O x 3  Marijo FileKathryn M Bonnie Roig DNP, AGPCNP-BC

## 2018-11-01 ENCOUNTER — Other Ambulatory Visit: Payer: PPO | Admitting: Primary Care

## 2018-11-01 ENCOUNTER — Other Ambulatory Visit: Payer: Self-pay

## 2018-11-01 DIAGNOSIS — Z515 Encounter for palliative care: Secondary | ICD-10-CM | POA: Diagnosis not present

## 2018-11-01 NOTE — Progress Notes (Signed)
[facility] Palliative Care Consult Note Telephone: [phone]  Fax: [phone]  TELEHEALTH VISIT STATEMENT Due to the COVID-19 crisis, this visit was done via telemedicine from my office. It was initiated and consented to by this patient and/or family.  PATIENT NAME: Sarah Mckee DOB: February 02, 1946 MRN: [id number]  PRIMARY CARE PROVIDER:   Marinda Elk, [place]  REFERRING PROVIDER:  Marinda Elk, MD [address],  [place] [address] [phone]  RESPONSIBLE PARTY:   Extended Emergency Contact Information Primary Emergency Contact: Dauphinais,Larry B Address: [place], [place] [address] [place] Phone: [phone] Relation: Spouse Secondary Emergency Contact: [place], [place] [address] [place] Phone: [phone] Relation: Daughter  Palliative Care was asked to follow this patient by consultation request of Marinda Elk, MD. This is a follow up visit.  ASSESSMENT AND RECOMMENDATIONS:   1. Goals of Care: Maximize quality of life and symptom management.  2. Symptom Management:   Dyspnea: Is taking  albuterol and then budesonide nebulized in tandem which is working well. When asked if breathing was improved she states it is some improved. Still does not have nebulized ipratropium. Uses spiriva however well with spacer and post albuterol neb Rx. Has seen  decreasing pro air use.  Reiterated she could use another albuterol neb if needed.  3. Family /Caregiver/Community Supports:  Lives with husband, daughter also helps with alds and iadls.  4. Cognitive / Functional decline: Alert, oriented x 3. Some difficulty following all the medication names and preparations in discussing nebulized vs inhaler medication. Will continue to clarify regimen for improved respiratory symptom management.  5. Advanced Care  Directive: Will discuss on next visit in 1 week. Has no ACP on file.  6. Follow up Palliative Care Visit: Palliative care will continue to follow for goals of care clarification and symptom management. Return 1 week or prn.  I spent 25 minutes providing this consultation,  from 1500 to 1525. More than 50% of the time in this consultation was spent coordinating communication.   HISTORY OF PRESENT ILLNESS:  Sarah Mckee is a 73 y.o. year old female with multiple medical problems including COPD, PH, OA, lymphedema. Palliative Care was asked to help address goals of care.   CODE STATUS: TDB  PPS: 40% HOSPICE ELIGIBILITY/DIAGNOSIS: TBD  PAST MEDICAL HISTORY:  Past Medical History:  Diagnosis Date  . Arthritis   . COPD (chronic obstructive pulmonary disease) (HCC)    2L 02 at baseline  . Hypertension   . Lymphedema of left leg   . Lymphedema of right lower extremity   . Pulmonary HTN ([place])     SOCIAL HX:  Social History   Tobacco Use  . Smoking status: Former Smoker    Quit date: 05/20/2007    Years since quitting: 11.4  . Smokeless tobacco: Never Used  Substance Use Topics  . Alcohol use: No    ALLERGIES:  Allergies  Allergen Reactions  . Codeine Shortness Of Breath  . Demerol [Meperidine] Shortness Of Breath  . Morphine And Related Shortness Of Breath  . Oxycodone Shortness Of Breath  . Alendronate Other (See Comments)    Other reaction(s): Other (See Comments) esophagitis  . Amoxicillin-Pot Clavulanate Other (See Comments)    Unknown Has patient had a PCN reaction causing immediate rash, facial/tongue/throat swelling, SOB  or lightheadedness with hypotension: Unknown Has patient had a PCN reaction causing severe rash involving mucus membranes or skin necrosis: Unknown Has patient had a PCN reaction that required hospitalization: Unknown Has patient had a PCN reaction occurring within the last 10 years: Unknown If all of the above answers are "NO", then may proceed with  Cephalosporin use.   Marland Kitchen. Risedronate Other (See Comments)    Other reaction(s): Other (See Comments) epigastric pain  . Shellfish Allergy Nausea And Vomiting     PERTINENT MEDICATIONS:  Outpatient Encounter Medications as of 11/01/2018  Medication Sig  . acetaminophen (TYLENOL) 500 MG tablet Take 500-1,000 mg by mouth See admin instructions. Take 500 mg by mouth in the morning/afternoon and take 1000 mg by mouth in the evening  . albuterol (PROVENTIL) (2.5 MG/3ML) 0.083% nebulizer solution USE 1 VIAL IN NEBULIZER EVERY 6 HOURS AS NEEDED FOR WHEEZING OR SHORTNESS OF BREATH  . ALPRAZolam (XANAX) 0.25 MG tablet Take 0.25 mg by mouth 2 (two) times daily as needed for anxiety.   Marland Kitchen. aluminum-magnesium hydroxide-simethicone (MAALOX) 200-200-20 MG/5ML SUSP Take 30 mLs by mouth 4 (four) times daily -  before meals and at bedtime. (Patient taking differently: Take 30 mLs by mouth 4 (four) times daily as needed (for indegestion). )  . ASPERCREME LIDOCAINE EX Apply 1 application topically as needed (for hand/shoulder pain).  Marland Kitchen. aspirin 81 MG tablet Take 81 mg by mouth daily.  . budesonide (PULMICORT) 0.5 MG/2ML nebulizer solution Inhale 2 mLs into the lungs 2 (two) times daily.  . Cholecalciferol (VITAMIN D) 400 UNIT/ML LIQD Take by mouth. Pt taking pill form,   2 tablets daily  . cyclobenzaprine (FLEXERIL) 5 MG tablet Take 5 mg by mouth 3 (three) times daily as needed.  . fluticasone furoate-vilanterol (BREO ELLIPTA) 200-25 MCG/INH AEPB Inhale 1 puff into the lungs daily.  . furosemide (LASIX) 40 MG tablet Take 40 mg by mouth daily as needed for fluid.  Marland Kitchen. HYDROmorphone (DILAUDID) 2 MG tablet Take 1 tablet (2 mg total) by mouth every 6 (six) hours as needed for moderate pain or severe pain.  . montelukast (SINGULAIR) 10 MG tablet Take 10 mg by mouth daily.  . OXYGEN Inhale into the lungs continuous.  Bertram Gala. Polyethyl Glycol-Propyl Glycol (SYSTANE OP) Place 1 drop into both eyes daily.  . potassium chloride SA  (K-DUR,KLOR-CON) 20 MEQ tablet Take 20 mEq by mouth daily.   . predniSONE (DELTASONE) 10 MG tablet Label  & dispense according to the schedule below. 5 Pills PO for 1 day then, 4 Pills PO for 1 day, 3 Pills PO for 1 day, 2 Pills PO for 1 day, 1 Pill PO for 1 days then STOP.  Marland Kitchen. predniSONE (DELTASONE) 20 MG tablet Take 2 tablets (40 mg total) by mouth daily with breakfast. 10 days  . PROAIR HFA 108 (90 Base) MCG/ACT inhaler Inhale 2 puffs into the lungs every 4 (four) hours as needed for wheezing or shortness of breath.   . Tiotropium Bromide Monohydrate (SPIRIVA RESPIMAT) 2.5 MCG/ACT AERS INHALE 2 PUFFS INTO THE LUNGS BY DAILY  . triamterene-hydrochlorothiazide (MAXZIDE-25) 37.5-25 MG tablet Take 1 tablet by mouth daily.   No facility-administered encounter medications on file as of 11/01/2018.     PHYSICAL EXAM/ROS:   Current and past weights: none up to date, 2/202 was 181 lb.  General: NAD, frail ,   HEENT Much better with abx. Decreased pressure and more energy. Cardiovascular: no chest pain reported, no edema,  Pulmonary: improved cough, no  increased SOB or DOE.  Abdomen: appetite fair, endorses constipation at times MSK:  no joint deformities, ambulates with help. Skin: no rashes or wounds reported Neurological: Weakness, no new deficits.  Marijo FileKathryn M Javarian Jakubiak DNP, MPH, AGPCNP-BC

## 2018-11-04 ENCOUNTER — Other Ambulatory Visit: Payer: PPO | Admitting: Primary Care

## 2018-11-04 DIAGNOSIS — Z515 Encounter for palliative care: Secondary | ICD-10-CM

## 2018-11-05 ENCOUNTER — Other Ambulatory Visit: Payer: Self-pay

## 2018-11-05 NOTE — Progress Notes (Signed)
[facility] Consult Note Telephone: [phone]  Fax: [phone]  TELEHEALTH VISIT STATEMENT Due to the COVID-19 crisis, this visit was done via telemedicine from my office. It was initiated and consented to by this patient and/or family.  PATIENT NAME: Sarah Mckee DOB: November 24, 1945 MRN: [id number]  PRIMARY CARE PROVIDER:   Marinda Elk, [place]  REFERRING PROVIDER:  Marinda Elk, MD [address],  [place] [address] [phone]  RESPONSIBLE PARTY:   Extended Emergency Contact Information Primary Emergency Contact: Samaras,Larry B Address: [place], [place] [address] [place] Phone: [phone] Relation: Spouse Secondary Emergency Contact: [place], [place] [address] [place] Phone: [phone] Relation: Daughter  Palliative Care was asked to follow this patient by consultation request of Marinda Elk, MD. This is a follow up visit initiated by patient on walk in basis.   ASSESSMENT AND RECOMMENDATIONS:   1. Goals of Care: Maximize quality of life and symptom management.  2. Symptom Management:   Dyspnea: States better on antibiotic which was started on 10/30/2018 but still has SOB. Covid symptoms reviewed e.g. new / worse cough, fever, increased SOB, chills, no known exposure. All questions answered in the negative.   States she would like prednisone  pack as her usual Rx. All allergies reviewed with patient. I e-scribed a prednisone burst of 40 mg x 5 days, with clear instructions to contact PCP for any follow up and especially if she does not improve. Education provided RE current recommendation for burst vs taper, with no evidence that the taper has a better outcome. She agreed to the burst and she voiced understanding at follow up plan.  3. Family /Caregiver/Community Supports:   Lives with husband, daughter also helps with alds and iadls.  4. Cognitive / Functional decline: Alert and oriented x 3.  5. Advanced Care Directive: Will continue to discuss at usual follow up appt.   6. Follow up Palliative Care Visit: Palliative care will continue to follow for goals of care clarification and symptom management. Return 4-6 weeks or prn.  I spent 25 minutes providing this consultation,  from 1200 to 1225. More than 50% of the time in this consultation was spent coordinating communication.   HISTORY OF PRESENT ILLNESS:  Sarah Mckee is a 73 y.o. year old female with multiple medical problems including COPD, PH, OA, lymphedema. Palliative Care was asked to help address goals of care.   CODE STATUS:  TBD  PPS: 40% HOSPICE ELIGIBILITY/DIAGNOSIS: TBD  PAST MEDICAL HISTORY:  Past Medical History:  Diagnosis Date  . Arthritis   . COPD (chronic obstructive pulmonary disease) (HCC)    2L 02 at baseline  . Hypertension   . Lymphedema of left leg   . Lymphedema of right lower extremity   . Pulmonary HTN ([place])     SOCIAL HX:  Social History   Tobacco Use  . Smoking status: Former Smoker    Quit date: 05/20/2007    Years since quitting: 11.4  . Smokeless tobacco: Never Used  Substance Use Topics  . Alcohol use: No    ALLERGIES:  Allergies  Allergen Reactions  . Codeine Shortness Of Breath  . Demerol [Meperidine] Shortness Of Breath  . Morphine And Related Shortness Of Breath  . Oxycodone Shortness Of Breath  . Alendronate Other (  See Comments)    Other reaction(s): Other (See Comments) esophagitis  . Amoxicillin-Pot Clavulanate Other (See Comments)    Unknown Has patient had a PCN reaction causing immediate rash, facial/tongue/throat swelling, SOB or lightheadedness with hypotension: Unknown Has patient had a PCN reaction causing severe rash involving mucus membranes or skin necrosis: Unknown Has patient had a PCN reaction that required hospitalization:  Unknown Has patient had a PCN reaction occurring within the last 10 years: Unknown If all of the above answers are "NO", then may proceed with Cephalosporin use.   Marland Kitchen. Risedronate Other (See Comments)    Other reaction(s): Other (See Comments) epigastric pain  . Shellfish Allergy Nausea And Vomiting     PERTINENT MEDICATIONS:  Outpatient Encounter Medications as of 11/04/2018  Medication Sig  . acetaminophen (TYLENOL) 500 MG tablet Take 500-1,000 mg by mouth See admin instructions. Take 500 mg by mouth in the morning/afternoon and take 1000 mg by mouth in the evening  . albuterol (PROVENTIL) (2.5 MG/3ML) 0.083% nebulizer solution USE 1 VIAL IN NEBULIZER EVERY 6 HOURS AS NEEDED FOR WHEEZING OR SHORTNESS OF BREATH  . ALPRAZolam (XANAX) 0.25 MG tablet Take 0.25 mg by mouth 2 (two) times daily as needed for anxiety.   Marland Kitchen. aluminum-magnesium hydroxide-simethicone (MAALOX) 200-200-20 MG/5ML SUSP Take 30 mLs by mouth 4 (four) times daily -  before meals and at bedtime. (Patient taking differently: Take 30 mLs by mouth 4 (four) times daily as needed (for indegestion). )  . ASPERCREME LIDOCAINE EX Apply 1 application topically as needed (for hand/shoulder pain).  Marland Kitchen. aspirin 81 MG tablet Take 81 mg by mouth daily.  . budesonide (PULMICORT) 0.5 MG/2ML nebulizer solution Inhale 2 mLs into the lungs 2 (two) times daily.  . Cholecalciferol (VITAMIN D) 400 UNIT/ML LIQD Take by mouth. Pt taking pill form,   2 tablets daily  . cyclobenzaprine (FLEXERIL) 5 MG tablet Take 5 mg by mouth 3 (three) times daily as needed.  . fluticasone furoate-vilanterol (BREO ELLIPTA) 200-25 MCG/INH AEPB Inhale 1 puff into the lungs daily.  . furosemide (LASIX) 40 MG tablet Take 40 mg by mouth daily as needed for fluid.  Marland Kitchen. HYDROmorphone (DILAUDID) 2 MG tablet Take 1 tablet (2 mg total) by mouth every 6 (six) hours as needed for moderate pain or severe pain.  . montelukast (SINGULAIR) 10 MG tablet Take 10 mg by mouth daily.  . OXYGEN  Inhale into the lungs continuous.  Bertram Gala. Polyethyl Glycol-Propyl Glycol (SYSTANE OP) Place 1 drop into both eyes daily.  . potassium chloride SA (K-DUR,KLOR-CON) 20 MEQ tablet Take 20 mEq by mouth daily.   . predniSONE (DELTASONE) 10 MG tablet Label  & dispense according to the schedule below. 5 Pills PO for 1 day then, 4 Pills PO for 1 day, 3 Pills PO for 1 day, 2 Pills PO for 1 day, 1 Pill PO for 1 days then STOP.  Marland Kitchen. predniSONE (DELTASONE) 20 MG tablet Take 2 tablets (40 mg total) by mouth daily with breakfast. 10 days  . PROAIR HFA 108 (90 Base) MCG/ACT inhaler Inhale 2 puffs into the lungs every 4 (four) hours as needed for wheezing or shortness of breath.   . Tiotropium Bromide Monohydrate (SPIRIVA RESPIMAT) 2.5 MCG/ACT AERS INHALE 2 PUFFS INTO THE LUNGS BY DAILY  . triamterene-hydrochlorothiazide (MAXZIDE-25) 37.5-25 MG tablet Take 1 tablet by mouth daily.   No facility-administered encounter medications on file as of 11/04/2018.     PHYSICAL EXAM/ROS:   Current and past weights: 178 per report  General: NAD, frail , WNWD Cardiovascular: no chest pain reported, no edema,  Pulmonary: + cough, no increased SOB, Covid questions screening negative. Abdomen: appetite fair, endorses constipation, continent of bowel GU: denies dysuria, continent of urine with timed void MSK:  Ambulates with difficulty Skin: no rashes or wounds reported Neurological: Weakness, no knew deficits  Marijo FileKathryn M Shameek Nyquist DNP, AGPCNP-BC

## 2018-11-17 ENCOUNTER — Other Ambulatory Visit: Payer: Self-pay

## 2018-11-17 ENCOUNTER — Emergency Department: Payer: PPO

## 2018-11-17 ENCOUNTER — Inpatient Hospital Stay
Admission: EM | Admit: 2018-11-17 | Discharge: 2018-11-26 | DRG: 956 | Disposition: A | Discharge status: Home Health | Payer: PPO | Attending: Internal Medicine | Admitting: Internal Medicine

## 2018-11-17 DIAGNOSIS — Z20828 Contact with and (suspected) exposure to other viral communicable diseases: Secondary | ICD-10-CM | POA: Diagnosis not present

## 2018-11-17 DIAGNOSIS — M879 Osteonecrosis, unspecified: Secondary | ICD-10-CM | POA: Diagnosis present

## 2018-11-17 DIAGNOSIS — I890 Lymphedema, not elsewhere classified: Secondary | ICD-10-CM | POA: Diagnosis present

## 2018-11-17 DIAGNOSIS — Z7982 Long term (current) use of aspirin: Secondary | ICD-10-CM | POA: Diagnosis not present

## 2018-11-17 DIAGNOSIS — Z9981 Dependence on supplemental oxygen: Secondary | ICD-10-CM

## 2018-11-17 DIAGNOSIS — Z7401 Bed confinement status: Secondary | ICD-10-CM | POA: Diagnosis not present

## 2018-11-17 DIAGNOSIS — J9610 Chronic respiratory failure, unspecified whether with hypoxia or hypercapnia: Secondary | ICD-10-CM | POA: Diagnosis present

## 2018-11-17 DIAGNOSIS — Z1159 Encounter for screening for other viral diseases: Secondary | ICD-10-CM | POA: Diagnosis not present

## 2018-11-17 DIAGNOSIS — I959 Hypotension, unspecified: Secondary | ICD-10-CM | POA: Diagnosis not present

## 2018-11-17 DIAGNOSIS — S72141A Displaced intertrochanteric fracture of right femur, initial encounter for closed fracture: Principal | ICD-10-CM | POA: Diagnosis present

## 2018-11-17 DIAGNOSIS — W19XXXD Unspecified fall, subsequent encounter: Secondary | ICD-10-CM | POA: Diagnosis not present

## 2018-11-17 DIAGNOSIS — M25551 Pain in right hip: Secondary | ICD-10-CM | POA: Diagnosis present

## 2018-11-17 DIAGNOSIS — M549 Dorsalgia, unspecified: Secondary | ICD-10-CM

## 2018-11-17 DIAGNOSIS — Z7951 Long term (current) use of inhaled steroids: Secondary | ICD-10-CM

## 2018-11-17 DIAGNOSIS — M810 Age-related osteoporosis without current pathological fracture: Secondary | ICD-10-CM | POA: Diagnosis present

## 2018-11-17 DIAGNOSIS — W010XXA Fall on same level from slipping, tripping and stumbling without subsequent striking against object, initial encounter: Secondary | ICD-10-CM | POA: Diagnosis present

## 2018-11-17 DIAGNOSIS — S32591A Other specified fracture of right pubis, initial encounter for closed fracture: Secondary | ICD-10-CM | POA: Diagnosis present

## 2018-11-17 DIAGNOSIS — M2550 Pain in unspecified joint: Secondary | ICD-10-CM | POA: Diagnosis not present

## 2018-11-17 DIAGNOSIS — M87059 Idiopathic aseptic necrosis of unspecified femur: Secondary | ICD-10-CM

## 2018-11-17 DIAGNOSIS — M6281 Muscle weakness (generalized): Secondary | ICD-10-CM | POA: Diagnosis not present

## 2018-11-17 DIAGNOSIS — Z8781 Personal history of (healed) traumatic fracture: Secondary | ICD-10-CM

## 2018-11-17 DIAGNOSIS — R52 Pain, unspecified: Secondary | ICD-10-CM | POA: Diagnosis not present

## 2018-11-17 DIAGNOSIS — J439 Emphysema, unspecified: Secondary | ICD-10-CM | POA: Diagnosis present

## 2018-11-17 DIAGNOSIS — Z885 Allergy status to narcotic agent status: Secondary | ICD-10-CM

## 2018-11-17 DIAGNOSIS — F329 Major depressive disorder, single episode, unspecified: Secondary | ICD-10-CM | POA: Diagnosis present

## 2018-11-17 DIAGNOSIS — R0902 Hypoxemia: Secondary | ICD-10-CM | POA: Diagnosis not present

## 2018-11-17 DIAGNOSIS — S299XXA Unspecified injury of thorax, initial encounter: Secondary | ICD-10-CM | POA: Diagnosis not present

## 2018-11-17 DIAGNOSIS — Z91013 Allergy to seafood: Secondary | ICD-10-CM | POA: Diagnosis not present

## 2018-11-17 DIAGNOSIS — S32040A Wedge compression fracture of fourth lumbar vertebra, initial encounter for closed fracture: Secondary | ICD-10-CM | POA: Diagnosis not present

## 2018-11-17 DIAGNOSIS — D539 Nutritional anemia, unspecified: Secondary | ICD-10-CM | POA: Diagnosis present

## 2018-11-17 DIAGNOSIS — S32030A Wedge compression fracture of third lumbar vertebra, initial encounter for closed fracture: Secondary | ICD-10-CM | POA: Diagnosis present

## 2018-11-17 DIAGNOSIS — Z880 Allergy status to penicillin: Secondary | ICD-10-CM

## 2018-11-17 DIAGNOSIS — I10 Essential (primary) hypertension: Secondary | ICD-10-CM | POA: Diagnosis present

## 2018-11-17 DIAGNOSIS — S32010A Wedge compression fracture of first lumbar vertebra, initial encounter for closed fracture: Secondary | ICD-10-CM | POA: Diagnosis not present

## 2018-11-17 DIAGNOSIS — D7589 Other specified diseases of blood and blood-forming organs: Secondary | ICD-10-CM | POA: Diagnosis present

## 2018-11-17 DIAGNOSIS — F3289 Other specified depressive episodes: Secondary | ICD-10-CM | POA: Diagnosis not present

## 2018-11-17 DIAGNOSIS — F064 Anxiety disorder due to known physiological condition: Secondary | ICD-10-CM | POA: Diagnosis not present

## 2018-11-17 DIAGNOSIS — M1990 Unspecified osteoarthritis, unspecified site: Secondary | ICD-10-CM | POA: Diagnosis present

## 2018-11-17 DIAGNOSIS — S72001A Fracture of unspecified part of neck of right femur, initial encounter for closed fracture: Secondary | ICD-10-CM | POA: Diagnosis present

## 2018-11-17 DIAGNOSIS — Z419 Encounter for procedure for purposes other than remedying health state, unspecified: Secondary | ICD-10-CM

## 2018-11-17 DIAGNOSIS — D696 Thrombocytopenia, unspecified: Secondary | ICD-10-CM | POA: Diagnosis not present

## 2018-11-17 DIAGNOSIS — J441 Chronic obstructive pulmonary disease with (acute) exacerbation: Secondary | ICD-10-CM | POA: Diagnosis not present

## 2018-11-17 DIAGNOSIS — S32020A Wedge compression fracture of second lumbar vertebra, initial encounter for closed fracture: Secondary | ICD-10-CM | POA: Diagnosis not present

## 2018-11-17 DIAGNOSIS — Z87891 Personal history of nicotine dependence: Secondary | ICD-10-CM | POA: Diagnosis not present

## 2018-11-17 DIAGNOSIS — Y92008 Other place in unspecified non-institutional (private) residence as the place of occurrence of the external cause: Secondary | ICD-10-CM

## 2018-11-17 DIAGNOSIS — W19XXXA Unspecified fall, initial encounter: Secondary | ICD-10-CM

## 2018-11-17 DIAGNOSIS — I272 Pulmonary hypertension, unspecified: Secondary | ICD-10-CM | POA: Diagnosis present

## 2018-11-17 DIAGNOSIS — S72141D Displaced intertrochanteric fracture of right femur, subsequent encounter for closed fracture with routine healing: Secondary | ICD-10-CM | POA: Diagnosis not present

## 2018-11-17 DIAGNOSIS — M545 Low back pain: Secondary | ICD-10-CM | POA: Diagnosis not present

## 2018-11-17 DIAGNOSIS — M87051 Idiopathic aseptic necrosis of right femur: Secondary | ICD-10-CM | POA: Diagnosis not present

## 2018-11-17 DIAGNOSIS — Z4789 Encounter for other orthopedic aftercare: Secondary | ICD-10-CM | POA: Diagnosis not present

## 2018-11-17 DIAGNOSIS — B379 Candidiasis, unspecified: Secondary | ICD-10-CM | POA: Diagnosis not present

## 2018-11-17 DIAGNOSIS — M4856XA Collapsed vertebra, not elsewhere classified, lumbar region, initial encounter for fracture: Secondary | ICD-10-CM | POA: Diagnosis not present

## 2018-11-17 DIAGNOSIS — J449 Chronic obstructive pulmonary disease, unspecified: Secondary | ICD-10-CM

## 2018-11-17 DIAGNOSIS — Z8249 Family history of ischemic heart disease and other diseases of the circulatory system: Secondary | ICD-10-CM

## 2018-11-17 DIAGNOSIS — F419 Anxiety disorder, unspecified: Secondary | ICD-10-CM | POA: Diagnosis present

## 2018-11-17 DIAGNOSIS — Z888 Allergy status to other drugs, medicaments and biological substances status: Secondary | ICD-10-CM

## 2018-11-17 DIAGNOSIS — E876 Hypokalemia: Secondary | ICD-10-CM | POA: Diagnosis not present

## 2018-11-17 DIAGNOSIS — Z01810 Encounter for preprocedural cardiovascular examination: Secondary | ICD-10-CM | POA: Diagnosis not present

## 2018-11-17 DIAGNOSIS — E569 Vitamin deficiency, unspecified: Secondary | ICD-10-CM | POA: Diagnosis not present

## 2018-11-17 DIAGNOSIS — Z79899 Other long term (current) drug therapy: Secondary | ICD-10-CM

## 2018-11-17 DIAGNOSIS — D559 Anemia due to enzyme disorder, unspecified: Secondary | ICD-10-CM | POA: Diagnosis not present

## 2018-11-17 DIAGNOSIS — H04123 Dry eye syndrome of bilateral lacrimal glands: Secondary | ICD-10-CM | POA: Diagnosis not present

## 2018-11-17 DIAGNOSIS — M8008XD Age-related osteoporosis with current pathological fracture, vertebra(e), subsequent encounter for fracture with routine healing: Secondary | ICD-10-CM | POA: Diagnosis not present

## 2018-11-17 LAB — SAMPLE TO BLOOD BANK

## 2018-11-17 LAB — COMPREHENSIVE METABOLIC PANEL
ALT: 36 U/L (ref 0–44)
AST: 28 U/L (ref 15–41)
Albumin: 3.5 g/dL (ref 3.5–5.0)
Alkaline Phosphatase: 83 U/L (ref 38–126)
Anion gap: 7 (ref 5–15)
BUN: 16 mg/dL (ref 8–23)
CO2: 39 mmol/L — ABNORMAL HIGH (ref 22–32)
Calcium: 9 mg/dL (ref 8.9–10.3)
Chloride: 95 mmol/L — ABNORMAL LOW (ref 98–111)
Creatinine, Ser: 0.58 mg/dL (ref 0.44–1.00)
GFR calc Af Amer: >60 mL/min (ref >60)
GFR calc non Af Amer: >60 mL/min (ref >60)
Glucose, Bld: 124 mg/dL — ABNORMAL HIGH (ref 70–99)
Potassium: 3.9 mmol/L (ref 3.5–5.1)
Sodium: 141 mmol/L (ref 135–145)
Total Bilirubin: 0.7 mg/dL (ref 0.3–1.2)
Total Protein: 6.7 g/dL (ref 6.5–8.1)

## 2018-11-17 LAB — CBC WITH DIFFERENTIAL/PLATELET
Abs Immature Granulocytes: 0.23 10*3/uL — ABNORMAL HIGH (ref 0.00–0.07)
Basophils Absolute: 0 10*3/uL (ref 0.0–0.1)
Basophils Relative: 0 %
Eosinophils Absolute: 0.1 10*3/uL (ref 0.0–0.5)
Eosinophils Relative: 2 %
HCT: 40.4 % (ref 36.0–46.0)
Hemoglobin: 12.4 g/dL (ref 12.0–15.0)
Immature Granulocytes: 3 %
Lymphocytes Relative: 26 %
Lymphs Abs: 2.4 10*3/uL (ref 0.7–4.0)
MCH: 31 pg (ref 26.0–34.0)
MCHC: 30.7 g/dL (ref 30.0–36.0)
MCV: 101 fL — ABNORMAL HIGH (ref 80.0–100.0)
Monocytes Absolute: 0.8 10*3/uL (ref 0.1–1.0)
Monocytes Relative: 9 %
Neutro Abs: 5.6 10*3/uL (ref 1.7–7.7)
Neutrophils Relative %: 60 %
Platelets: 161 10*3/uL (ref 150–400)
RBC: 4 MIL/uL (ref 3.87–5.11)
RDW: 13.1 % (ref 11.5–15.5)
WBC: 9.2 10*3/uL (ref 4.0–10.5)
nRBC: 0 % (ref 0.0–0.2)

## 2018-11-17 LAB — PROTIME-INR
INR: 0.9 (ref 0.8–1.2)
Prothrombin Time: 11.6 seconds (ref 11.4–15.2)

## 2018-11-17 LAB — APTT: aPTT: 29 seconds (ref 24–36)

## 2018-11-17 MED ORDER — SODIUM CHLORIDE 0.9 % IV SOLN
INTRAVENOUS | Status: DC
  Administered 2018-11-18: 01:00:00 via INTRAVENOUS

## 2018-11-17 MED ORDER — MORPHINE SULFATE (PF) 4 MG/ML IV SOLN
4.0000 mg | INTRAVENOUS | Status: AC | PRN
  Administered 2018-11-17 – 2018-11-18 (×2): 4 mg via INTRAVENOUS
  Filled 2018-11-17 (×2): qty 1

## 2018-11-17 MED ORDER — ONDANSETRON HCL 4 MG/2ML IJ SOLN
4.0000 mg | Freq: Once | INTRAMUSCULAR | Status: AC
  Administered 2018-11-17: 4 mg via INTRAVENOUS
  Filled 2018-11-17: qty 2

## 2018-11-17 NOTE — ED Triage Notes (Signed)
Reports walking in living room, has mechanical fall, c/o of pain to left hip. Presents with rotation and shortening to left leg.

## 2018-11-17 NOTE — ED Provider Notes (Signed)
[facility] Emergency Department Provider Note  ____________________________________________   First MD Initiated Contact with Patient 11/17/18 2257     (approximate)  I have reviewed the triage vital signs and the nursing notes.   HISTORY  Chief Complaint Fall and Hip Pain    HPI Sarah Mckee is a 73 y.o. female with medical history as listed below which notably includes chronic COPD on 2 to 3 L of oxygen 24 hours a day as well as a history of chronic lymphedema in bilateral lower extremities.  She presents by EMS for evaluation of acute onset severe pain in the right hip after a mechanical fall at home.  She reports that [facility] and was on her way through the living room when she slipped and fell and landed on the right hip.  The pain is constant but also comes in spasms and is sharp and aching.  She has never had a hip fracture before but she did have a pelvic fracture due to mechanical fall about 5 years ago that required rehab.  She did not strike her head, she did not lose consciousness, she denies headache or neck pain.  She did not sustain any injuries to her arms or her left leg.  She has no numbness nor tingling in the right leg, below her toes.  She was at her baseline earlier today and says that she "felt fine" earlier today prior to the fall that occurred just prior to arrival.  Moving the right leg makes the pain worse and nothing in particular other than holding still makes it better but the pain still comes in waves.        Past Medical History:  Diagnosis Date  . Arthritis   . COPD (chronic obstructive pulmonary disease) (HCC)    2L 02 at baseline  . Hypertension   . Lymphedema of left leg   . Lymphedema of right lower extremity   . Pulmonary HTN [facility](HCC)     Patient Active Problem List   Diagnosis Date Noted  . COPD with acute exacerbation (HCC) 06/27/2017  . Solitary pulmonary nodule-LLL 04/17/2016  . COPD  exacerbation (HCC) 11/06/2015  . Chronic respiratory failure (HCC) 10/03/2015  . COPD with hypoxia (HCC) 10/03/2015  . Acute respiratory failure (HCC) 08/11/2015    Past Surgical History:  Procedure Laterality Date  . CATARACT EXTRACTION W/PHACO Left 04/28/2017   Procedure: CATARACT EXTRACTION PHACO AND INTRAOCULAR LENS PLACEMENT (IOC);  Surgeon: Galen ManilaPorfilio, William, MD;  Location: [facility] ORS;  Service: Ophthalmology;  Laterality: Left;  US 01:00.3 AP% 19.7 CDE 11.88 Fluid Pack lot # [id number] H  . none    . PARS PLANA VITRECTOMY W/ REPAIR OF MACULAR HOLE    . TUBAL LIGATION      Prior to Admission medications   Medication Sig Start Date End Date Taking? Authorizing Provider  acetaminophen (TYLENOL) 500 MG tablet Take 500-1,000 mg by mouth See admin instructions. Take 500 mg by mouth in the morning/afternoon and take 1000 mg by mouth in the evening   Yes [provider]  albuterol (PROVENTIL) (2.5 MG/3ML) 0.083% nebulizer solution USE 1 VIAL IN NEBULIZER EVERY 6 HOURS AS NEEDED FOR WHEEZING OR SHORTNESS OF BREATH 03/19/18  Yes Kasa, Wallis BambergKurian, MD  ALPRAZolam Prudy Feeler(XANAX) 0.25 MG tablet Take 0.25 mg by mouth 2 (two) times daily as needed for anxiety.  07/18/15  Yes [provider]  aluminum-magnesium hydroxide-simethicone (MAALOX) 200-200-20 MG/5ML SUSP Take 30 mLs by mouth  4 (four) times daily -  before meals and at bedtime. Patient taking differently: Take 30 mLs by mouth 4 (four) times daily as needed (for indegestion).  04/01/16  Yes Sharman CheekStafford, Phillip, MD  ASPERCREME LIDOCAINE EX Apply 1 application topically as needed (for hand/shoulder pain).   Yes [provider]  aspirin 81 MG tablet Take 81 mg by mouth daily.   Yes [provider]  budesonide (PULMICORT) 0.5 MG/2ML nebulizer solution Inhale 2 mLs into the lungs 2 (two) times daily. 12/29/17 12/29/18 Yes [provider]  Cholecalciferol (VITAMIN D) 400 UNIT/ML LIQD Take by mouth. Pt taking pill form,   2  tablets daily   Yes [provider]  fluticasone furoate-vilanterol (BREO ELLIPTA) 200-25 MCG/INH AEPB Inhale 1 puff into the lungs daily. 07/26/18  Yes Erin FullingKasa, Kurian, MD  furosemide (LASIX) 40 MG tablet Take 40 mg by mouth daily as needed for fluid.   Yes [provider]  HYDROmorphone (DILAUDID) 2 MG tablet Take 1 tablet (2 mg total) by mouth every 6 (six) hours as needed for moderate pain or severe pain. 06/30/17  Yes Houston SirenSainani, Vivek J, MD  nystatin cream (MYCOSTATIN) Apply 1 application topically 2 (two) times daily. 01/14/18 01/14/19 Yes [provider]  OXYGEN Inhale into the lungs continuous.   Yes [provider]  Polyethyl Glycol-Propyl Glycol (SYSTANE OP) Place 1 drop into both eyes daily.   Yes [provider]  potassium chloride SA (K-DUR,KLOR-CON) 20 MEQ tablet Take 20 mEq by mouth daily.    Yes [provider]  PROAIR HFA 108 [phone](90 Base) MCG/ACT inhaler Inhale 2 puffs into the lungs every 4 (four) hours as needed for wheezing or shortness of breath.  06/13/15  Yes [provider]  sertraline (ZOLOFT) 25 MG tablet Take 50 mg by mouth daily. 01/14/18 01/14/19 Yes [provider]  Tiotropium Bromide Monohydrate (SPIRIVA RESPIMAT) 2.5 MCG/ACT AERS INHALE 2 PUFFS INTO THE LUNGS BY DAILY 07/07/18  Yes Kasa, Wallis BambergKurian, MD  triamterene-hydrochlorothiazide (MAXZIDE-25) 37.5-25 MG tablet Take 1 tablet by mouth daily. 08/06/15  Yes [provider]    Allergies Codeine, Demerol [meperidine], Morphine and related, Oxycodone, Alendronate, Amoxicillin-pot clavulanate, Risedronate, and Shellfish allergy  Family History  Problem Relation Age of Onset  . Heart attack Mother   . Diabetes Mother   . Cancer Father   . Thyroid disease Father   . Diabetes Sister   . Diabetes Brother     Social History Social History   Tobacco Use  . Smoking status: Former Smoker    Quit date: 05/20/2007    Years since quitting: 11.5  . Smokeless  tobacco: Never Used  Substance Use Topics  . Alcohol use: No  . Drug use: No    Review of Systems Constitutional: No fever/chills Eyes: No visual changes. ENT: No sore throat. Cardiovascular: Denies chest pain. Respiratory: Denies acute shortness of breath. Gastrointestinal: No abdominal pain.  No nausea, no vomiting.  No diarrhea.  No constipation. Genitourinary: Negative for dysuria. Musculoskeletal: Severe pain in right hip as described above.  Negative for neck pain.  Negative for back pain. Integumentary: Negative for rash. Neurological: Negative for headaches, focal weakness or numbness.   ____________________________________________   PHYSICAL EXAM:  VITAL SIGNS: ED Triage Vitals  Enc Vitals Group     BP 11/17/18 2230 131/75     Pulse Rate 11/17/18 2230 94     Resp 11/17/18 2230 (!) 24     Temp 11/17/18 2230 97.8 F (36.6 C)  Temp Source 11/17/18 2230 Oral     SpO2 11/17/18 2230 96 %     Weight 11/17/18 2231 83 kg (183 lb)     Height 11/17/18 2231 1.575 m (5\' 2" )     Head Circumference --      Peak Flow --      Pain Score 11/17/18 2231 0     Pain Loc --      Pain Edu? --      Excl. in GC? --     Constitutional: Appears to be in pain but is oriented and alert. Eyes: Conjunctivae are normal.  Head: Atraumatic. Nose: No congestion/rhinnorhea. Mouth/Throat: Mucous membranes are moist. Neck: No stridor.  No meningeal signs.   Cardiovascular: Normal rate, regular rhythm. Good peripheral circulation. Grossly normal heart sounds. Respiratory: Normal respiratory effort.  No retractions. No audible wheezing. Gastrointestinal: Soft and nontender. No distention.  Musculoskeletal: Chronic bilateral lymphedema in lower extremities.  Warm and well-perfused.  Tenderness to palpation of the right hip/proximal femur and severe pain with any attempt at passive movement of the extremity.  The right leg is shortened and externally rotated.  Neurovascularly intact.  Neurologic:  Normal speech and language. No gross focal neurologic deficits are appreciated.  Skin:  Skin is warm, dry and intact. No rash noted. Psychiatric: Mood and affect are normal. Speech and behavior are normal.  ____________________________________________   LABS (all labs ordered are listed, but only abnormal results are displayed)  Labs Reviewed  COMPREHENSIVE METABOLIC PANEL - Abnormal; Notable for the following components:      Result Value   Chloride 95 (*)    CO2 39 (*)    Glucose, Bld 124 (*)    All other components within normal limits  CBC WITH DIFFERENTIAL/PLATELET - Abnormal; Notable for the following components:   MCV 101.0 (*)    Abs Immature Granulocytes 0.23 (*)    All other components within normal limits  URINALYSIS, ROUTINE W REFLEX MICROSCOPIC - Abnormal; Notable for the following components:   Color, Urine YELLOW (*)    APPearance CLEAR (*)    All other components within normal limits  SARS CORONAVIRUS 2 (HOSPITAL ORDER, PERFORMED IN [facility])  APTT  PROTIME-INR  SAMPLE TO BLOOD BANK  TYPE AND SCREEN   ____________________________________________  EKG  ED ECG REPORT I, Loleta Roseory Tashiba Timoney, the attending physician, personally viewed and interpreted this ECG.  Date: 11/17/2018 EKG Time: 23:51 Rate: 92 Rhythm: normal sinus rhythm with multiple PVCs QRS Axis: normal Intervals: normal with RBBB ST/T Wave abnormalities: Non-specific ST segment / T-wave changes, but no clear evidence of acute ischemia. Narrative Interpretation: no definitive evidence of acute ischemia; does not meet STEMI criteria.   ____________________________________________  RADIOLOGY I, Loleta Roseory Noach Calvillo, personally viewed and evaluated these images (plain radiographs) as part of my medical decision making, as well as reviewing the written report by the radiologist.  ED MD interpretation: Right-sided comminuted intertrochanteric fracture. No acute abnormalities on  CXR.  Official radiology report(s): Dg Chest Port 1 View  Result Date: 11/17/2018 CLINICAL DATA:  73 year old female status post fall.  Hip fracture. EXAM: PORTABLE CHEST 1 VIEW COMPARISON:  Chest radiographs 06/27/2017 and earlier. FINDINGS: AP supine view at 2322 hours. Chronic large lung volumes with emphysema demonstrated on 2018 chest CT. No pneumothorax, pulmonary edema, pleural effusion or acute pulmonary opacity. Normal cardiac size and mediastinal contours. Visualized tracheal air column is within normal limits. Negative visible bowel gas pattern. IMPRESSION: Emphysema (ICD10-J43.9). No acute cardiopulmonary abnormality. Electronically  Signed   By: Genevie Ann M.D.   On: 11/17/2018 23:40   Dg Hip Unilat W Or Wo Pelvis 2-3 Views Right  Result Date: 11/17/2018 CLINICAL DATA:  73 year old female status post fall. Hip pain. EXAM: DG HIP (WITH OR WITHOUT PELVIS) 2-3V RIGHT COMPARISON:  CT Abdomen and Pelvis 04/01/2016. FINDINGS: Chronic bilateral femoral head AVN as seen on the comparison CT. There is a comminuted acute intertrochanteric fracture of the right femur with varus impaction. The right femoral head remains normally located. The proximal left femur appears grossly intact. Chronic right inferior pubic ramus fracture. No acute pelvis fracture identified. Negative visible bowel gas pattern. IMPRESSION: 1. Comminuted acute intertrochanteric fracture of the right femur with varus impaction. 2. Chronic bilateral femoral head AVN. Chronic right inferior pubic ramus fracture. Electronically Signed   By: Genevie Ann M.D.   On: 11/17/2018 23:42    ____________________________________________   PROCEDURES   Procedure(s) performed (including Critical Care):  Procedures   ____________________________________________   INITIAL IMPRESSION / MDM / [place] / ED COURSE  As part of my medical decision making, I reviewed the following data within the [place] notes  reviewed and incorporated, Labs reviewed , EKG interpreted , Old chart reviewed, Radiograph reviewed , Discussed with orthopedic surgeon (Dr. Rudene Christians), Discussed with admitting physician , Notes from prior ED visits and [place] Controlled Substance Database   *Sarah Mckee was evaluated in Emergency Department on 11/18/2018 for the symptoms described in the history of present illness. She was evaluated in the context of the global COVID-19 pandemic, which necessitated consideration that the patient might be at risk for infection with the SARS-CoV-2 virus that causes COVID-19. Institutional protocols and algorithms that pertain to the evaluation of patients at risk for COVID-19 are in a state of rapid change based on information released by regulatory bodies including the CDC and federal and state organizations. These policies and algorithms were followed during the patient's care in the ED.  Some ED evaluations and interventions may be delayed as a result of limited staffing during the pandemic.*      Differential diagnosis includes, but is not limited to, hip fracture (likely intertrochanteric femur fracture), hip dislocation, pelvic fracture.  There is no evidence of any other traumatic injuries at this time and no evidence of any injuries or trauma to her head or neck.  He is alert and oriented and in severe pain from the right hip.  She has a number of allergies listed to essentially every narcotic pain medication but she is in pain and tolerated fentanyl 100 mcg IV by EMS prior to arrival.  At this point I think she needs something longer acting and we will monitor carefully but I am giving her morphine 4 mg IV for improved pain control prior to the radiographs that she requires.  Based on her physical exam I am certain enough that this is a fracture that I am proceeding with a preoperative work-up including a rapid COVID-19 test, chest x-ray, EKG, and essential lab work including coagulation studies.  I have  asked the patient during n.p.o. for now.  I started normal saline infusion at 125 mL an hour.  No evidence of infectious process at this time nor a need for anything other than standard precautions.  Work-up is pending but anticipate consultation with orthopedics and hospitalist admission.  The patient has a chronic oxygen requirement of 2 to 3 L and she is currently satting about 94 to 95%  on 3 L which we will maintain.  Clinical Course as of Nov 17 141  Wed Nov 17, 2018  2346 Comminuted right intertrochanteric femur fracture with chronic AVN bilaterally and chronic pelvic fracture as previously described.  I am paging Dr. Rosita Kea to discuss and then will discuss with the hospitalist.  DG Hip Unilat W or Wo Pelvis 2-3 Views Right [CF]  2347 Lab work is all within normal limits other than a mild elevation of CO2 on a comprehensive metabolic panel.  No leukocytosis, no abnormal coagulation studies.  COVID test is pending.   [CF]  2350 Discussed case by phone with Dr. Rosita Kea and sent him message through [facility] in basket with the patient information.  I will now contact the hospitalist for admission.  Patient has been updated.   [CF]  Thu Nov 18, 2018  0003 Send [facility] secure chat message to hospitalist team.   [CF]  0008 Janeann Merl with hospitalist team acknowledged admission.  I ordered PRN morphine for the patient.   [CF]  1610 Informed hospitalists by secure chat that the covid-19 swab is negative  SARS Coronavirus 2: NEGATIVE [CF]    Clinical Course User Index [CF] Loleta Rose, MD     ____________________________________________  FINAL CLINICAL IMPRESSION(S) / ED DIAGNOSES  Final diagnoses:  Closed comminuted intertrochanteric fracture of proximal end of right femur, initial encounter (HCC)  Fall, initial encounter  COPD, severe (HCC)  Avascular necrosis of femur, unspecified laterality (HCC)  History of pelvic fracture     MEDICATIONS GIVEN DURING THIS VISIT:  Medications  0.9 %   sodium chloride infusion ( Intravenous New Bag/Given 11/18/18 0108)  morphine 4 MG/ML injection 4 mg (4 mg Intravenous Given 11/18/18 0103)  ondansetron (ZOFRAN) injection 4 mg (4 mg Intravenous Given 11/17/18 2313)     ED Discharge Orders    None       Note:  This document was prepared using Dragon voice recognition software and may include unintentional dictation errors.   Loleta Rose, MD 11/18/18 [id number]

## 2018-11-17 NOTE — ED Notes (Signed)
Awaiting md eval, requesting pain meds.

## 2018-11-18 ENCOUNTER — Inpatient Hospital Stay: Payer: PPO | Admitting: Anesthesiology

## 2018-11-18 ENCOUNTER — Encounter
Admission: EM | Disposition: A | Discharge status: Home Health | Payer: Self-pay | Source: Home / Self Care | Attending: Nurse Practitioner

## 2018-11-18 ENCOUNTER — Encounter: Payer: Self-pay | Admitting: Anesthesiology

## 2018-11-18 ENCOUNTER — Inpatient Hospital Stay: Payer: PPO

## 2018-11-18 DIAGNOSIS — F419 Anxiety disorder, unspecified: Secondary | ICD-10-CM | POA: Diagnosis present

## 2018-11-18 DIAGNOSIS — S72001A Fracture of unspecified part of neck of right femur, initial encounter for closed fracture: Secondary | ICD-10-CM | POA: Diagnosis present

## 2018-11-18 DIAGNOSIS — I272 Pulmonary hypertension, unspecified: Secondary | ICD-10-CM | POA: Diagnosis present

## 2018-11-18 DIAGNOSIS — J9610 Chronic respiratory failure, unspecified whether with hypoxia or hypercapnia: Secondary | ICD-10-CM | POA: Diagnosis present

## 2018-11-18 DIAGNOSIS — M810 Age-related osteoporosis without current pathological fracture: Secondary | ICD-10-CM | POA: Diagnosis present

## 2018-11-18 DIAGNOSIS — Z7982 Long term (current) use of aspirin: Secondary | ICD-10-CM | POA: Diagnosis not present

## 2018-11-18 DIAGNOSIS — M879 Osteonecrosis, unspecified: Secondary | ICD-10-CM | POA: Diagnosis present

## 2018-11-18 DIAGNOSIS — Z8249 Family history of ischemic heart disease and other diseases of the circulatory system: Secondary | ICD-10-CM | POA: Diagnosis not present

## 2018-11-18 DIAGNOSIS — Z91013 Allergy to seafood: Secondary | ICD-10-CM | POA: Diagnosis not present

## 2018-11-18 DIAGNOSIS — J439 Emphysema, unspecified: Secondary | ICD-10-CM | POA: Diagnosis present

## 2018-11-18 DIAGNOSIS — Y92008 Other place in unspecified non-institutional (private) residence as the place of occurrence of the external cause: Secondary | ICD-10-CM | POA: Diagnosis not present

## 2018-11-18 DIAGNOSIS — I10 Essential (primary) hypertension: Secondary | ICD-10-CM | POA: Diagnosis present

## 2018-11-18 DIAGNOSIS — S72141A Displaced intertrochanteric fracture of right femur, initial encounter for closed fracture: Secondary | ICD-10-CM | POA: Diagnosis present

## 2018-11-18 DIAGNOSIS — M25551 Pain in right hip: Secondary | ICD-10-CM | POA: Diagnosis present

## 2018-11-18 DIAGNOSIS — S32030A Wedge compression fracture of third lumbar vertebra, initial encounter for closed fracture: Secondary | ICD-10-CM | POA: Diagnosis present

## 2018-11-18 DIAGNOSIS — D7589 Other specified diseases of blood and blood-forming organs: Secondary | ICD-10-CM | POA: Diagnosis present

## 2018-11-18 DIAGNOSIS — M1990 Unspecified osteoarthritis, unspecified site: Secondary | ICD-10-CM | POA: Diagnosis present

## 2018-11-18 DIAGNOSIS — Z1159 Encounter for screening for other viral diseases: Secondary | ICD-10-CM | POA: Diagnosis not present

## 2018-11-18 DIAGNOSIS — D539 Nutritional anemia, unspecified: Secondary | ICD-10-CM | POA: Diagnosis present

## 2018-11-18 DIAGNOSIS — F329 Major depressive disorder, single episode, unspecified: Secondary | ICD-10-CM | POA: Diagnosis present

## 2018-11-18 DIAGNOSIS — S32591A Other specified fracture of right pubis, initial encounter for closed fracture: Secondary | ICD-10-CM | POA: Diagnosis present

## 2018-11-18 DIAGNOSIS — Z885 Allergy status to narcotic agent status: Secondary | ICD-10-CM | POA: Diagnosis not present

## 2018-11-18 DIAGNOSIS — Z880 Allergy status to penicillin: Secondary | ICD-10-CM | POA: Diagnosis not present

## 2018-11-18 DIAGNOSIS — Z9981 Dependence on supplemental oxygen: Secondary | ICD-10-CM | POA: Diagnosis not present

## 2018-11-18 DIAGNOSIS — Z87891 Personal history of nicotine dependence: Secondary | ICD-10-CM | POA: Diagnosis not present

## 2018-11-18 DIAGNOSIS — Z888 Allergy status to other drugs, medicaments and biological substances status: Secondary | ICD-10-CM | POA: Diagnosis not present

## 2018-11-18 DIAGNOSIS — W010XXA Fall on same level from slipping, tripping and stumbling without subsequent striking against object, initial encounter: Secondary | ICD-10-CM | POA: Diagnosis present

## 2018-11-18 DIAGNOSIS — I890 Lymphedema, not elsewhere classified: Secondary | ICD-10-CM | POA: Diagnosis present

## 2018-11-18 HISTORY — PX: INTRAMEDULLARY (IM) NAIL INTERTROCHANTERIC: SHX5875

## 2018-11-18 LAB — CBC
HCT: 39.3 % (ref 36.0–46.0)
Hemoglobin: 11.9 g/dL — ABNORMAL LOW (ref 12.0–15.0)
MCH: 30.7 pg (ref 26.0–34.0)
MCHC: 30.3 g/dL (ref 30.0–36.0)
MCV: 101.6 fL — ABNORMAL HIGH (ref 80.0–100.0)
Platelets: 149 10*3/uL — ABNORMAL LOW (ref 150–400)
RBC: 3.87 MIL/uL (ref 3.87–5.11)
RDW: 13.2 % (ref 11.5–15.5)
WBC: 16 10*3/uL — ABNORMAL HIGH (ref 4.0–10.5)
nRBC: 0 % (ref 0.0–0.2)

## 2018-11-18 LAB — URINALYSIS, ROUTINE W REFLEX MICROSCOPIC
Bilirubin Urine: NEGATIVE
Glucose, UA: NEGATIVE mg/dL
Hgb urine dipstick: NEGATIVE
Ketones, ur: NEGATIVE mg/dL
Leukocytes,Ua: NEGATIVE
Nitrite: NEGATIVE
Protein, ur: NEGATIVE mg/dL
Specific Gravity, Urine: 1.014 (ref 1.005–1.030)
pH: 7 (ref 5.0–8.0)

## 2018-11-18 LAB — BASIC METABOLIC PANEL
Anion gap: 6 (ref 5–15)
BUN: 16 mg/dL (ref 8–23)
CO2: 40 mmol/L — ABNORMAL HIGH (ref 22–32)
Calcium: 8.5 mg/dL — ABNORMAL LOW (ref 8.9–10.3)
Chloride: 95 mmol/L — ABNORMAL LOW (ref 98–111)
Creatinine, Ser: 0.53 mg/dL (ref 0.44–1.00)
GFR calc Af Amer: >60 mL/min (ref >60)
GFR calc non Af Amer: >60 mL/min (ref >60)
Glucose, Bld: 154 mg/dL — ABNORMAL HIGH (ref 70–99)
Potassium: 4 mmol/L (ref 3.5–5.1)
Sodium: 141 mmol/L (ref 135–145)

## 2018-11-18 LAB — SURGICAL PCR SCREEN
MRSA, PCR: NEGATIVE
Staphylococcus aureus: NEGATIVE

## 2018-11-18 LAB — ABO/RH: ABO/RH(D): A NEG

## 2018-11-18 LAB — SARS CORONAVIRUS 2 BY RT PCR (HOSPITAL ORDER, PERFORMED IN ~~LOC~~ HOSPITAL LAB): SARS Coronavirus 2: NEGATIVE

## 2018-11-18 SURGERY — FIXATION, FRACTURE, INTERTROCHANTERIC, WITH INTRAMEDULLARY ROD
Anesthesia: Spinal | Site: Hip | Laterality: Right

## 2018-11-18 MED ORDER — PROPOFOL 10 MG/ML IV BOLUS
INTRAVENOUS | Status: DC | PRN
  Administered 2018-11-18: 10 mg via INTRAVENOUS

## 2018-11-18 MED ORDER — MAGNESIUM HYDROXIDE 400 MG/5ML PO SUSP: 30.0000 mL | Freq: Every day | ORAL | Status: DC | PRN

## 2018-11-18 MED ORDER — ALUM & MAG HYDROXIDE-SIMETH 200-200-20 MG/5ML PO SUSP: 30.0000 mL | Freq: Four times a day (QID) | ORAL | Status: DC | PRN

## 2018-11-18 MED ORDER — VITAMIN D 25 MCG (1000 UNIT) PO TABS
2000.0000 [IU] | ORAL_TABLET | Freq: Every day | ORAL | Status: DC
  Administered 2018-11-19 – 2018-11-26 (×7): 2000 [IU] via ORAL
  Filled 2018-11-18 (×7): qty 2

## 2018-11-18 MED ORDER — ALBUTEROL SULFATE HFA 108 (90 BASE) MCG/ACT IN AERS: 2.0000 | INHALATION_SPRAY | RESPIRATORY_TRACT | Status: DC | PRN

## 2018-11-18 MED ORDER — BUPIVACAINE HCL (PF) 0.5 % IJ SOLN
INTRAMUSCULAR | Status: DC | PRN
  Administered 2018-11-18: 2.5 mL

## 2018-11-18 MED ORDER — HYDROMORPHONE HCL 2 MG PO TABS
2.0000 mg | ORAL_TABLET | Freq: Four times a day (QID) | ORAL | Status: DC | PRN
  Administered 2018-11-19 – 2018-11-26 (×10): 2 mg via ORAL
  Filled 2018-11-18 (×10): qty 1

## 2018-11-18 MED ORDER — SODIUM CHLORIDE 0.9 % IV SOLN
INTRAVENOUS | Status: DC
  Administered 2018-11-18: 04:00:00 via INTRAVENOUS

## 2018-11-18 MED ORDER — FUROSEMIDE 40 MG PO TABS
40.0000 mg | ORAL_TABLET | Freq: Every day | ORAL | Status: DC | PRN
  Administered 2018-11-23: 40 mg via ORAL
  Filled 2018-11-18: qty 1

## 2018-11-18 MED ORDER — PHENOL 1.4 % MT LIQD
1.0000 | OROMUCOSAL | Status: DC | PRN
  Filled 2018-11-18: qty 177

## 2018-11-18 MED ORDER — MORPHINE SULFATE (PF) 4 MG/ML IV SOLN: 4.0000 mg | INTRAVENOUS | Status: DC | PRN

## 2018-11-18 MED ORDER — LACTATED RINGERS IV SOLN
INTRAVENOUS | Status: DC
  Administered 2018-11-18: 14:00:00 via INTRAVENOUS
  Administered 2018-11-25: 75 mL/h via INTRAVENOUS

## 2018-11-18 MED ORDER — BUDESONIDE 0.25 MG/2ML IN SUSP
2.0000 mL | Freq: Two times a day (BID) | RESPIRATORY_TRACT | Status: DC
  Administered 2018-11-18 – 2018-11-26 (×14): 0.25 mg via RESPIRATORY_TRACT
  Filled 2018-11-18 (×17): qty 2

## 2018-11-18 MED ORDER — KETOROLAC TROMETHAMINE 30 MG/ML IJ SOLN
15.0000 mg | Freq: Four times a day (QID) | INTRAMUSCULAR | Status: DC | PRN
  Filled 2018-11-18: qty 1

## 2018-11-18 MED ORDER — NEOMYCIN-POLYMYXIN B GU 40-200000 IR SOLN
Status: DC | PRN
  Administered 2018-11-18: 2 mL

## 2018-11-18 MED ORDER — IPRATROPIUM-ALBUTEROL 0.5-2.5 (3) MG/3ML IN SOLN
3.0000 mL | RESPIRATORY_TRACT | Status: DC
  Administered 2018-11-18 (×2): 3 mL via RESPIRATORY_TRACT
  Filled 2018-11-18: qty 3

## 2018-11-18 MED ORDER — MAGNESIUM CITRATE PO SOLN
1.0000 | Freq: Once | ORAL | Status: DC | PRN
  Filled 2018-11-18: qty 296

## 2018-11-18 MED ORDER — IPRATROPIUM-ALBUTEROL 0.5-2.5 (3) MG/3ML IN SOLN
RESPIRATORY_TRACT | Status: AC
  Administered 2018-11-18: 3 mL via RESPIRATORY_TRACT
  Filled 2018-11-18: qty 3

## 2018-11-18 MED ORDER — SODIUM CHLORIDE 0.9 % IV SOLN
INTRAVENOUS | Status: DC
  Administered 2018-11-18: 17:00:00 via INTRAVENOUS

## 2018-11-18 MED ORDER — NEOMYCIN-POLYMYXIN B GU 40-200000 IR SOLN
Status: AC
  Filled 2018-11-18: qty 1

## 2018-11-18 MED ORDER — ALPRAZOLAM 0.25 MG PO TABS
0.2500 mg | ORAL_TABLET | Freq: Two times a day (BID) | ORAL | Status: DC | PRN
  Administered 2018-11-22 – 2018-11-25 (×4): 0.25 mg via ORAL
  Filled 2018-11-18 (×4): qty 1

## 2018-11-18 MED ORDER — POTASSIUM CHLORIDE CRYS ER 20 MEQ PO TBCR
20.0000 meq | EXTENDED_RELEASE_TABLET | Freq: Every day | ORAL | Status: DC
  Administered 2018-11-19 – 2018-11-26 (×7): 20 meq via ORAL
  Filled 2018-11-18 (×7): qty 1

## 2018-11-18 MED ORDER — ACETAMINOPHEN 650 MG RE SUPP: 650.0000 mg | Freq: Four times a day (QID) | RECTAL | Status: DC | PRN

## 2018-11-18 MED ORDER — EPHEDRINE SULFATE 50 MG/ML IJ SOLN
5.0000 mg | INTRAMUSCULAR | Status: DC | PRN
  Administered 2018-11-18: 5 mg via INTRAVENOUS

## 2018-11-18 MED ORDER — ENOXAPARIN SODIUM 40 MG/0.4ML ~~LOC~~ SOLN
40.0000 mg | SUBCUTANEOUS | Status: DC
  Administered 2018-11-19 – 2018-11-23 (×5): 40 mg via SUBCUTANEOUS
  Filled 2018-11-18 (×5): qty 0.4

## 2018-11-18 MED ORDER — POLYVINYL ALCOHOL 1.4 % OP SOLN
1.0000 [drp] | Freq: Every day | OPHTHALMIC | Status: DC
  Administered 2018-11-19 – 2018-11-26 (×2): 1 [drp] via OPHTHALMIC
  Filled 2018-11-18: qty 15

## 2018-11-18 MED ORDER — IPRATROPIUM-ALBUTEROL 0.5-2.5 (3) MG/3ML IN SOLN
3.0000 mL | Freq: Two times a day (BID) | RESPIRATORY_TRACT | Status: DC
  Administered 2018-11-19 – 2018-11-26 (×13): 3 mL via RESPIRATORY_TRACT
  Filled 2018-11-18 (×15): qty 3

## 2018-11-18 MED ORDER — ACETAMINOPHEN 325 MG PO TABS
650.0000 mg | ORAL_TABLET | Freq: Four times a day (QID) | ORAL | Status: DC | PRN
  Administered 2018-11-18 – 2018-11-25 (×11): 650 mg via ORAL
  Filled 2018-11-18 (×12): qty 2

## 2018-11-18 MED ORDER — TIOTROPIUM BROMIDE MONOHYDRATE 18 MCG IN CAPS
18.0000 ug | ORAL_CAPSULE | Freq: Every day | RESPIRATORY_TRACT | Status: DC
  Administered 2018-11-19 – 2018-11-26 (×3): 18 ug via RESPIRATORY_TRACT
  Filled 2018-11-18: qty 5

## 2018-11-18 MED ORDER — CLINDAMYCIN PHOSPHATE 900 MG/50ML IV SOLN
900.0000 mg | Freq: Four times a day (QID) | INTRAVENOUS | Status: AC
  Administered 2018-11-18 – 2018-11-19 (×3): 900 mg via INTRAVENOUS
  Filled 2018-11-18 (×3): qty 50

## 2018-11-18 MED ORDER — ONDANSETRON HCL 4 MG/2ML IJ SOLN
4.0000 mg | Freq: Four times a day (QID) | INTRAMUSCULAR | Status: DC | PRN
  Administered 2018-11-25 (×2): 4 mg via INTRAVENOUS
  Filled 2018-11-18 (×3): qty 2

## 2018-11-18 MED ORDER — SODIUM CHLORIDE 0.9 % IV SOLN
INTRAVENOUS | Status: DC | PRN
  Administered 2018-11-18: 20 ug/min via INTRAVENOUS

## 2018-11-18 MED ORDER — NALOXONE HCL 0.4 MG/ML IJ SOLN
INTRAMUSCULAR | Status: DC | PRN
  Administered 2018-11-18 (×2): 40 ug via INTRAVENOUS

## 2018-11-18 MED ORDER — BISACODYL 10 MG RE SUPP
10.0000 mg | Freq: Every day | RECTAL | Status: DC | PRN
  Administered 2018-11-22 – 2018-11-26 (×2): 10 mg via RECTAL
  Filled 2018-11-18 (×2): qty 1

## 2018-11-18 MED ORDER — PROPOFOL 500 MG/50ML IV EMUL
INTRAVENOUS | Status: AC
  Filled 2018-11-18: qty 50

## 2018-11-18 MED ORDER — POLYETHYL GLYCOL-PROPYL GLYCOL 0.4-0.3 % OP GEL: Freq: Every day | OPHTHALMIC | Status: DC

## 2018-11-18 MED ORDER — METOCLOPRAMIDE HCL 10 MG PO TABS: 5.0000 mg | ORAL_TABLET | Freq: Three times a day (TID) | ORAL | Status: DC | PRN

## 2018-11-18 MED ORDER — ALBUTEROL SULFATE (2.5 MG/3ML) 0.083% IN NEBU: 2.5000 mg | INHALATION_SOLUTION | RESPIRATORY_TRACT | Status: DC | PRN

## 2018-11-18 MED ORDER — PHENYLEPHRINE HCL (PRESSORS) 10 MG/ML IV SOLN
INTRAVENOUS | Status: DC | PRN
  Administered 2018-11-18: 100 ug via INTRAVENOUS
  Administered 2018-11-18 (×2): 200 ug via INTRAVENOUS

## 2018-11-18 MED ORDER — CLINDAMYCIN PHOSPHATE 900 MG/50ML IV SOLN
900.0000 mg | Freq: Once | INTRAVENOUS | Status: AC
  Administered 2018-11-18: 900 mg via INTRAVENOUS
  Filled 2018-11-18 (×2): qty 50

## 2018-11-18 MED ORDER — METOCLOPRAMIDE HCL 5 MG/ML IJ SOLN: 5.0000 mg | Freq: Three times a day (TID) | INTRAMUSCULAR | Status: DC | PRN

## 2018-11-18 MED ORDER — EPHEDRINE SULFATE 50 MG/ML IJ SOLN
INTRAMUSCULAR | Status: AC
  Administered 2018-11-18: 5 mg via INTRAVENOUS
  Filled 2018-11-18: qty 1

## 2018-11-18 MED ORDER — ONDANSETRON HCL 4 MG PO TABS
4.0000 mg | ORAL_TABLET | Freq: Four times a day (QID) | ORAL | Status: DC | PRN
  Administered 2018-11-24: 4 mg via ORAL
  Filled 2018-11-18: qty 1

## 2018-11-18 MED ORDER — TRAZODONE HCL 50 MG PO TABS: 25.0000 mg | ORAL_TABLET | Freq: Every evening | ORAL | Status: DC | PRN

## 2018-11-18 MED ORDER — PROPOFOL 500 MG/50ML IV EMUL
INTRAVENOUS | Status: DC | PRN
  Administered 2018-11-18: 25 ug/kg/min via INTRAVENOUS

## 2018-11-18 MED ORDER — SERTRALINE HCL 50 MG PO TABS
50.0000 mg | ORAL_TABLET | Freq: Every day | ORAL | Status: DC
  Administered 2018-11-19 – 2018-11-26 (×7): 50 mg via ORAL
  Filled 2018-11-18 (×7): qty 1

## 2018-11-18 MED ORDER — TRIAMTERENE-HCTZ 37.5-25 MG PO TABS
1.0000 | ORAL_TABLET | Freq: Every day | ORAL | Status: DC
  Administered 2018-11-22 – 2018-11-26 (×4): 1 via ORAL
  Filled 2018-11-18 (×9): qty 1

## 2018-11-18 MED ORDER — HYDROMORPHONE HCL 1 MG/ML IJ SOLN
1.0000 mg | INTRAMUSCULAR | Status: DC | PRN
  Administered 2018-11-18 – 2018-11-25 (×4): 1 mg via INTRAVENOUS
  Filled 2018-11-18 (×4): qty 1

## 2018-11-18 MED ORDER — MENTHOL 3 MG MT LOZG
1.0000 | LOZENGE | OROMUCOSAL | Status: DC | PRN
  Filled 2018-11-18: qty 9

## 2018-11-18 MED ORDER — DOCUSATE SODIUM 100 MG PO CAPS
100.0000 mg | ORAL_CAPSULE | Freq: Two times a day (BID) | ORAL | Status: DC
  Administered 2018-11-18 – 2018-11-24 (×12): 100 mg via ORAL
  Filled 2018-11-18 (×12): qty 1

## 2018-11-18 SURGICAL SUPPLY — 35 items
BIT DRILL 4.3MMS DISTAL GRDTED (BIT) IMPLANT
CANISTER SUCT 1200ML W/VALVE (MISCELLANEOUS) ×3 IMPLANT
CHLORAPREP W/TINT 26 (MISCELLANEOUS) ×3 IMPLANT
CORTICAL BONE SCR 5.0MM X 46MM (Screw) ×3 IMPLANT
COVER WAND RF STERILE (DRAPES) ×3 IMPLANT
DRAPE SHEET LG 3/4 BI-LAMINATE (DRAPES) ×3 IMPLANT
DRAPE U-SHAPE 47X51 STRL (DRAPES) ×3 IMPLANT
DRILL 4.3MMS DISTAL GRADUATED (BIT) ×3
DRSG OPSITE POSTOP 3X4 (GAUZE/BANDAGES/DRESSINGS) ×9 IMPLANT
GLOVE BIOGEL PI IND STRL 9 (GLOVE) ×1 IMPLANT
GLOVE BIOGEL PI INDICATOR 9 (GLOVE) ×2
GLOVE SURG SYN 9.0  PF PI (GLOVE) ×2
GLOVE SURG SYN 9.0 PF PI (GLOVE) ×1 IMPLANT
GOWN SRG 2XL LVL 4 RGLN SLV (GOWNS) ×1 IMPLANT
GOWN STRL NON-REIN 2XL LVL4 (GOWNS) ×2
GOWN STRL REUS W/ TWL LRG LVL3 (GOWN DISPOSABLE) ×1 IMPLANT
GOWN STRL REUS W/TWL LRG LVL3 (GOWN DISPOSABLE) ×2
GUIDEPIN VERSANAIL DSP 3.2X444 (ORTHOPEDIC DISPOSABLE SUPPLIES) ×4 IMPLANT
GUIDEWIRE BALL NOSE 80CM (WIRE) ×2 IMPLANT
HFN RH 130 DEG 9MM X 360MM (Nail) ×2 IMPLANT
KIT TURNOVER KIT A (KITS) ×3 IMPLANT
MAT ABSORB  FLUID 56X50 GRAY (MISCELLANEOUS) ×2
MAT ABSORB FLUID 56X50 GRAY (MISCELLANEOUS) ×1 IMPLANT
NDL FILTER BLUNT 18X1 1/2 (NEEDLE) ×1 IMPLANT
NEEDLE FILTER BLUNT 18X 1/2SAF (NEEDLE) ×2
NEEDLE FILTER BLUNT 18X1 1/2 (NEEDLE) ×1 IMPLANT
NS IRRIG 500ML POUR BTL (IV SOLUTION) ×3 IMPLANT
PACK HIP COMPR (MISCELLANEOUS) ×3 IMPLANT
SCALPEL PROTECTED #15 DISP (BLADE) ×6 IMPLANT
SCREW CORTICL BON 5.0MM X 46MM (Screw) IMPLANT
SCREW LAG 10.5MMX105MM HFN (Screw) ×2 IMPLANT
STAPLER SKIN PROX 35W (STAPLE) ×3 IMPLANT
SUT VIC AB 1 CT1 36 (SUTURE) ×3 IMPLANT
SUT VIC AB 2-0 CT1 (SUTURE) ×3 IMPLANT
SYR 10ML LL (SYRINGE) ×3 IMPLANT

## 2018-11-18 NOTE — Op Note (Signed)
11/18/2018  4:00 PM  PATIENT:  Sarah Mckee  73 y.o. female  PRE-OPERATIVE DIAGNOSIS:  Right hip Fracture peritrochanteric  POST-OPERATIVE DIAGNOSIS:  Right hip Fracture peritrochanteric  PROCEDURE:  Procedure(s): INTRAMEDULLARY (IM) NAIL INTERTROCHANTRIC (Right)  SURGEON: Laurene Footman, MD  ASSISTANTS: None  ANESTHESIA:   spinal  EBL:  Total I/O In: -  Out: 320 [Urine:120; Blood:200]  BLOOD ADMINISTERED:none  DRAINS: none   LOCAL MEDICATIONS USED:  NONE  SPECIMEN:  No Specimen  DISPOSITION OF SPECIMEN:  N/A  COUNTS:  YES  TOURNIQUET:  * No tourniquets in log *  IMPLANTS: Biomet long affixes 9 x 360 fracture nail with 105 mm lag screw and a 46 mm distal cortical screw  DICTATION: .Dragon Dictation patient was brought to the operating room and after adequate spinal anesthesia was obtained she was transferred to the fracture table.  Coban was wrapped around the edematous foot which was then placed in the traction boot with traction applied left leg in the well-leg holder.  C arm was brought in and good visualization of the proximal femur was obtained with nearly anatomic alignment.  After prepping and draping using a barrier drape method appropriate patient identification and timeout procedures were completed.  An incision was made proximally and a guidewire inserted into the tip of the trochanter with proximal reaming carried out and then placement of the long guidewire.  Measurement was made off of this and the 9 mm rod was obtained and inserted.  One is inserted to the appropriate depth a small lateral incision was made in a center center guidewire inserted followed by drilling and placing the leg screw.  This was cut the compression was applied and then the proximal locking knot was placed with a quarter turn off to allow for further compression.  Next the distal interlocking screw was placed without difficulty through the slotted hole drilling measuring and then placing the  5.0 screw the wounds were then irrigated and closed with #1 Vicryl deep 2-0 Vicryl subcutaneously and skin staples Xeroform and honeycomb dressings applied.  PLAN OF CARE: Continue as inpatient  PATIENT DISPOSITION:  PACU - hemodynamically stable.

## 2018-11-18 NOTE — Anesthesia Procedure Notes (Signed)
Spinal  Patient location during procedure: OR Start time: 11/18/2018 2:40 PM End time: 11/18/2018 2:45 PM Staffing Resident/CRNA: Nelda Marseille, CRNA Performed: resident/CRNA  Preanesthetic Checklist Completed: patient identified, site marked, surgical consent, pre-op evaluation, timeout performed, IV checked, risks and benefits discussed and monitors and equipment checked Spinal Block Patient position: sitting Prep: Betadine Patient monitoring: heart rate, continuous pulse ox, blood pressure and cardiac monitor Approach: midline Location: L4-5 Injection technique: single-shot Needle Needle type: Whitacre and Introducer  Needle gauge: 25 G Needle length: 9 cm Assessment Sensory level: T10 Additional Notes Negative paresthesia. Negative blood return. Positive free-flowing CSF. Expiration date of kit checked and confirmed. Patient tolerated procedure well, without complications.

## 2018-11-18 NOTE — Anesthesia Preprocedure Evaluation (Addendum)
Anesthesia Evaluation  Patient identified by MRN, date of birth, ID band Patient awake    Reviewed: Allergy & Precautions, NPO status , Patient's Chart, lab work & pertinent test results  Airway Mallampati: II       Dental  (+) Teeth Intact   Pulmonary shortness of breath and with exertion, COPD,  COPD inhaler and oxygen dependent, former smoker,     + decreased breath sounds      Cardiovascular Exercise Tolerance: Poor hypertension, Pt. on medications  Rhythm:Regular Rate:Normal     Neuro/Psych negative neurological ROS  negative psych ROS   GI/Hepatic negative GI ROS, Neg liver ROS,   Endo/Other  negative endocrine ROS  Renal/GU negative Renal ROS     Musculoskeletal  (+) Arthritis , Osteoarthritis,    Abdominal Normal abdominal exam  (+)   Peds negative pediatric ROS (+)  Hematology negative hematology ROS (+)   Anesthesia Other Findings Past Medical History: No date: Arthritis No date: COPD (chronic obstructive pulmonary disease) (HCC)     Comment:  2L 02 at baseline No date: Hypertension No date: Lymphedema of left leg No date: Lymphedema of right lower extremity No date: Pulmonary HTN (HCC)  Reproductive/Obstetrics                             Anesthesia Physical  Anesthesia Plan  ASA: III  Anesthesia Plan: Spinal   Post-op Pain Management:    Induction: Intravenous  PONV Risk Score and Plan: 0  Airway Management Planned: Nasal Cannula  Additional Equipment:   Intra-op Plan:   Post-operative Plan:   Informed Consent: I have reviewed the patients History and Physical, chart, labs and discussed the procedure including the risks, benefits and alternatives for the proposed anesthesia with the patient or authorized representative who has indicated his/her understanding and acceptance.       Plan Discussed with: CRNA and Surgeon  Anesthesia Plan Comments: (Talked  with patient and patient's daughter, Ms. Elza Rafter and explained the anesthetic plan of regional only.  Also discussed the high risks with her severe COPD.  Both appear to understand and agree to proceed )       Anesthesia Quick Evaluation

## 2018-11-18 NOTE — TOC Progression Note (Signed)
Requested the price for Lovenox once obtained will notify the patient of the cost

## 2018-11-18 NOTE — Progress Notes (Signed)
Reason for consult is right peritrochanteric hip fracture  History: Patient is a household ambulator with use of a walker very limited secondary to COPD and bilateral hip avascular necrosis.  By her impression it sounds like her lungs are her limiting factor more than her hip pain.  She got up to get a candy bar last night stumbled and fell and was brought to the emergency room where she is found to have fairly transverse fracture at the level of the lesser trochanter.  She did not have prodromal symptoms.  On examination the leg is slightly externally rotated with significant edema in the lower extremity.  She is able to flex extend the toes and has trace pulses.  Skin is intact around the hip without ecchymosis.  Radiographs show slightly comminuted transverse fracture at the level lesser trochanter with evidence of chronic avascular necrosis of both femoral heads.  Impression is peritrochanteric hip fracture  Plan: I discussed with her that without fixation she is will not be a get out of bed without extreme pain and with her COPD she is at high risk for operative and nonoperative treatment.  For pain relief and mobilization plan on ORIF later today.

## 2018-11-18 NOTE — ED Notes (Addendum)
ED TO INPATIENT HANDOFF REPORT  ED Nurse Name and Phone #:  Elijah Birkom RN  [phone]  S Name/Age/Gender Sarah Mckee 73 y.o. female Room/Bed: ED09A/ED09A  Code Status   Code Status: Full Code  Home/SNF/Other Home Patient oriented to: self, place, time and situation Is this baseline? No   Triage Complete: Triage complete  Chief Complaint Fall  Triage Note Reports walking in living room, has mechanical fall, c/o of pain to left hip. Presents with rotation and shortening to left leg.    Allergies Allergies  Allergen Reactions  . Codeine Shortness Of Breath  . Demerol [Meperidine] Shortness Of Breath  . Morphine And Related Shortness Of Breath  . Oxycodone Shortness Of Breath  . Alendronate Other (See Comments)    Other reaction(s): Other (See Comments) esophagitis  . Amoxicillin-Pot Clavulanate Other (See Comments)    Unknown Has patient had a PCN reaction causing immediate rash, facial/tongue/throat swelling, SOB or lightheadedness with hypotension: Unknown Has patient had a PCN reaction causing severe rash involving mucus membranes or skin necrosis: Unknown Has patient had a PCN reaction that required hospitalization: Unknown Has patient had a PCN reaction occurring within the last 10 years: Unknown If all of the above answers are "NO", then may proceed with Cephalosporin use.   Marland Kitchen. Risedronate Other (See Comments)    Other reaction(s): Other (See Comments) epigastric pain  . Shellfish Allergy Nausea And Vomiting    Level of Care/Admitting Diagnosis ED Disposition    ED Disposition Condition Comment   Admit  Hospital Area: [facility] [[id number]]  Level of Care: Med-Surg [16]  Covid Evaluation: Confirmed COVID Negative  Diagnosis: Closed right hip fracture [facility](HCC) [[id number]][[id number]]  Admitting Physician: Hannah BeatMANSY, JAN A [[id number]]  Attending Physician: Hannah BeatMANSY, JAN A [[id number]]  Estimated length of stay: past midnight tomorrow  Certification:: I certify this patient will  need inpatient services for at least 2 midnights  PT Class (Do Not Modify): Inpatient [101]  PT Acc Code (Do Not Modify): Private [1]       B Medical/Surgery History Past Medical History:  Diagnosis Date  . Arthritis   . COPD (chronic obstructive pulmonary disease) (HCC)    2L 02 at baseline  . Hypertension   . Lymphedema of left leg   . Lymphedema of right lower extremity   . Pulmonary HTN (HCC)    Past Surgical History:  Procedure Laterality Date  . CATARACT EXTRACTION W/PHACO Left 04/28/2017   Procedure: CATARACT EXTRACTION PHACO AND INTRAOCULAR LENS PLACEMENT (IOC);  Surgeon: Galen ManilaPorfilio, William, MD;  Location: [facility] ORS;  Service: Ophthalmology;  Laterality: Left;  US 01:00.3 AP% 19.7 CDE 11.88 Fluid Pack lot # [id number] H  . none    . PARS PLANA VITRECTOMY W/ REPAIR OF MACULAR HOLE    . TUBAL LIGATION       A IV Location/Drains/Wounds Patient Lines/Drains/Airways Status   Active Line/Drains/Airways    Name:   Placement date:   Placement time:   Site:   Days:   Peripheral IV 11/17/18 Anterior;Distal;Right Forearm   11/17/18    2227    Forearm   1   Urethral Catheter Zach NT Latex 14 Fr.   11/18/18    0011    Latex   less than 1   External Urinary Catheter   06/27/17    1400    -   509   Incision (Closed) 04/28/17 Eye Left   04/28/17    0810     569  Intake/Output Last 24 hours No intake or output data in the 24 hours ending 11/18/18 0307  Labs/Imaging Results for orders placed or performed during the hospital encounter of 11/17/18 (from the past 48 hour(s))  Sample to Blood Bank     Status: None   Collection Time: 11/17/18 10:34 PM  Result Value Ref Range   Blood Bank Specimen SAMPLE AVAILABLE FOR TESTING    Sample Expiration      11/20/2018,2359 Performed at [facility], [address] [place], [place] [address]   Comprehensive metabolic panel     Status: Abnormal   Collection Time: 11/17/18 10:34 PM  Result Value Ref Range   Sodium 141  135 - 145 mmol/L   Potassium 3.9 3.5 - 5.1 mmol/L   Chloride 95 (L) 98 - 111 mmol/L   CO2 39 (H) 22 - 32 mmol/L   Glucose, Bld 124 (H) 70 - 99 mg/dL   BUN 16 8 - 23 mg/dL   Creatinine, Ser 6.040.58 0.44 - 1.00 mg/dL   Calcium 9.0 8.9 - 54.010.3 mg/dL   Total Protein 6.7 6.5 - 8.1 g/dL   Albumin 3.5 3.5 - 5.0 g/dL   AST 28 15 - 41 U/L   ALT 36 0 - 44 U/L   Alkaline Phosphatase 83 38 - 126 U/L   Total Bilirubin 0.7 0.3 - 1.2 mg/dL   GFR calc non Af Amer >60 >60 mL/min   GFR calc Af Amer >60 >60 mL/min   Anion gap 7 5 - 15    Comment: Performed at [facility], [address] [place], [place] [address]  CBC WITH DIFFERENTIAL     Status: Abnormal   Collection Time: 11/17/18 10:34 PM  Result Value Ref Range   WBC 9.2 4.0 - 10.5 K/uL   RBC 4.00 3.87 - 5.11 MIL/uL   Hemoglobin 12.4 12.0 - 15.0 g/dL   HCT 14.740.4 82.936.0 - 56.246.0 %   MCV 101.0 (H) 80.0 - 100.0 fL   MCH 31.0 26.0 - 34.0 pg   MCHC 30.7 30.0 - 36.0 g/dL   RDW 13.013.1 86.511.5 - 78.415.5 %   Platelets 161 150 - 400 K/uL   nRBC 0.0 0.0 - 0.2 %   Neutrophils Relative % 60 %   Neutro Abs 5.6 1.7 - 7.7 K/uL   Lymphocytes Relative 26 %   Lymphs Abs 2.4 0.7 - 4.0 K/uL   Monocytes Relative 9 %   Monocytes Absolute 0.8 0.1 - 1.0 K/uL   Eosinophils Relative 2 %   Eosinophils Absolute 0.1 0.0 - 0.5 K/uL   Basophils Relative 0 %   Basophils Absolute 0.0 0.0 - 0.1 K/uL   Immature Granulocytes 3 %   Abs Immature Granulocytes 0.23 (H) 0.00 - 0.07 K/uL    Comment: Performed at [facility], [address] [place], [place] [address]  APTT     Status: None   Collection Time: 11/17/18 10:34 PM  Result Value Ref Range   aPTT 29 24 - 36 seconds    Comment: Performed at [facility], [address] [place], [place] [address]  Protime-INR     Status: None   Collection Time: 11/17/18 10:34 PM  Result Value Ref Range   Prothrombin Time 11.6 11.4 - 15.2 seconds   INR 0.9 0.8 - 1.2    Comment: (NOTE) INR goal varies based on  device and disease states. Performed at [facility], [address] [place], [place] [address]   Type and  screen     Status: None   Collection Time: 11/17/18 10:34 PM  Result Value Ref Range   ABO/RH(D) A NEG    Antibody Screen POS    Sample Expiration 11/20/2018,2359    Antibody Identification      ANTI D Performed at [facility], [address] [place], [place] [address]   Urinalysis, Routine w reflex microscopic     Status: Abnormal   Collection Time: 11/17/18 11:57 PM  Result Value Ref Range   Color, Urine YELLOW (A) YELLOW   APPearance CLEAR (A) CLEAR   Specific Gravity, Urine 1.014 1.005 - 1.030   pH 7.0 5.0 - 8.0   Glucose, UA NEGATIVE NEGATIVE mg/dL   Hgb urine dipstick NEGATIVE NEGATIVE   Bilirubin Urine NEGATIVE NEGATIVE   Ketones, ur NEGATIVE NEGATIVE mg/dL   Protein, ur NEGATIVE NEGATIVE mg/dL   Nitrite NEGATIVE NEGATIVE   Leukocytes,Ua NEGATIVE NEGATIVE    Comment: Performed at [facility], [address], [place], [place] [address]  SARS Coronavirus 2 (CEPHEID - Performed in [facility] hospital lab), Hosp Order     Status: None   Collection Time: 11/17/18 11:57 PM   Specimen: Urine, Catheterized; Nasopharyngeal  Result Value Ref Range   SARS Coronavirus 2 NEGATIVE NEGATIVE    Comment: (NOTE) If result is NEGATIVE SARS-CoV-2 target nucleic acids are NOT DETECTED. The SARS-CoV-2 RNA is generally detectable in upper and lower  respiratory specimens during the acute phase of infection. The lowest  concentration of SARS-CoV-2 viral copies this assay can detect is 250  copies / mL. A negative result does not preclude SARS-CoV-2 infection  and should not be used as the sole basis for treatment or other  patient management decisions.  A negative result may occur with  improper specimen collection / handling, submission of specimen other  than nasopharyngeal swab, presence of viral mutation(s) within the  areas targeted by this  assay, and inadequate number of viral copies  (<250 copies / mL). A negative result must be combined with clinical  observations, patient history, and epidemiological information. If result is POSITIVE SARS-CoV-2 target nucleic acids are DETECTED. The SARS-CoV-2 RNA is generally detectable in upper and lower  respiratory specimens dur ing the acute phase of infection.  Positive  results are indicative of active infection with SARS-CoV-2.  Clinical  correlation with patient history and other diagnostic information is  necessary to determine patient infection status.  Positive results do  not rule out bacterial infection or co-infection with other viruses. If result is PRESUMPTIVE POSTIVE SARS-CoV-2 nucleic acids MAY BE PRESENT.   A presumptive positive result was obtained on the submitted specimen  and confirmed on repeat testing.  While 2019 novel coronavirus  (SARS-CoV-2) nucleic acids may be present in the submitted sample  additional confirmatory testing may be necessary for epidemiological  and / or clinical management purposes  to differentiate between  SARS-CoV-2 and other Sarbecovirus currently known to infect humans.  If clinically indicated additional testing with an alternate test  methodology [phone]) is advised. The SARS-CoV-2 RNA is generally  detectable in upper and lower respiratory sp ecimens during the acute  phase of infection. The expected result is Negative. Fact Sheet for Patients:  BoilerBrush.com.cy Fact Sheet for Healthcare Providers: https://pope.com/ This test is not yet approved or cleared by the [place] FDA and has been authorized for detection and/or diagnosis of SARS-CoV-2 by FDA under an Emergency Use Authorization (EUA).  This EUA will remain in effect (meaning  this test can be used) for the duration of the COVID-19 declaration under Section 564(b)(1) of the Act, 21 U.S.C. section 360bbb-3(b)(1),  unless the authorization is terminated or revoked sooner. Performed at [facility], [address] [place], [place] [address]    Dg Chest GlasgowPort 1 View  Result Date: 11/17/2018 CLINICAL DATA:  73 year old female status post fall.  Hip fracture. EXAM: PORTABLE CHEST 1 VIEW COMPARISON:  Chest radiographs 06/27/2017 and earlier. FINDINGS: AP supine view at 2322 hours. Chronic large lung volumes with emphysema demonstrated on 2018 chest CT. No pneumothorax, pulmonary edema, pleural effusion or acute pulmonary opacity. Normal cardiac size and mediastinal contours. Visualized tracheal air column is within normal limits. Negative visible bowel gas pattern. IMPRESSION: Emphysema (ICD10-J43.9). No acute cardiopulmonary abnormality. Electronically Signed   By: Odessa FlemingH  Hall M.D.   On: 11/17/2018 23:40   Dg Hip Unilat W Or Wo Pelvis 2-3 Views Right  Result Date: 11/17/2018 CLINICAL DATA:  73 year old female status post fall. Hip pain. EXAM: DG HIP (WITH OR WITHOUT PELVIS) 2-3V RIGHT COMPARISON:  CT Abdomen and Pelvis 04/01/2016. FINDINGS: Chronic bilateral femoral head AVN as seen on the comparison CT. There is a comminuted acute intertrochanteric fracture of the right femur with varus impaction. The right femoral head remains normally located. The proximal left femur appears grossly intact. Chronic right inferior pubic ramus fracture. No acute pelvis fracture identified. Negative visible bowel gas pattern. IMPRESSION: 1. Comminuted acute intertrochanteric fracture of the right femur with varus impaction. 2. Chronic bilateral femoral head AVN. Chronic right inferior pubic ramus fracture. Electronically Signed   By: Odessa FlemingH  Hall M.D.   On: 11/17/2018 23:42    Pending Labs Unresulted Labs (From admission, onward)    Start     Ordered   11/18/18 0500  Basic metabolic panel  Tomorrow morning,   STAT     11/18/18 0204   11/18/18 0500  CBC  Tomorrow morning,   STAT     11/18/18 0204   11/18/18 0229  Surgical pcr  screen  Once,   STAT     11/18/18 0228          Vitals/Pain Today's Vitals   11/18/18 0058 11/18/18 0205 11/18/18 0300 11/18/18 0305  BP: 127/70  129/71   Pulse: 94  99   Resp: 18  16   Temp:      TempSrc:      SpO2: 97%  98%   Weight:      Height:      PainSc:  5  8  5      Isolation Precautions No active isolations  Medications Medications  0.9 %  sodium chloride infusion ( Intravenous New Bag/Given 11/18/18 0108)  HYDROmorphone (DILAUDID) tablet 2 mg (has no administration in time range)  furosemide (LASIX) tablet 40 mg (has no administration in time range)  triamterene-hydrochlorothiazide (MAXZIDE-25) 37.5-25 MG per tablet 1 tablet (has no administration in time range)  ALPRAZolam (XANAX) tablet 0.25 mg (has no administration in time range)  sertraline (ZOLOFT) tablet 50 mg (has no administration in time range)  alum & mag hydroxide-simeth (MAALOX/MYLANTA) 200-200-20 MG/5ML suspension 30 mL (has no administration in time range)  cholecalciferol (VITAMIN D3) tablet 2,000 Units (has no administration in time range)  potassium chloride SA (K-DUR) CR tablet 20 mEq (has no administration in time range)  albuterol (PROVENTIL) (2.5 MG/3ML) 0.083% nebulizer solution 2.5 mg (has no administration in time range)  budesonide (PULMICORT) nebulizer solution 0.25 mg (has no administration in time range)  Tiotropium Bromide Monohydrate AERS 2 puff (has no administration in time range)  polyethylene glycol 0.4% and propylene glycol 0.3% (SYSTANE) ophthalmic gel (has no administration in time range)  0.9 %  sodium chloride infusion (has no administration in time range)  acetaminophen (TYLENOL) tablet 650 mg (has no administration in time range)    Or  acetaminophen (TYLENOL) suppository 650 mg (has no administration in time range)  ketorolac (TORADOL) 30 MG/ML injection 15 mg (has no administration in time range)  traZODone (DESYREL) tablet 25 mg (has no administration in time range)   magnesium hydroxide (MILK OF MAGNESIA) suspension 30 mL (has no administration in time range)  ondansetron (ZOFRAN) tablet 4 mg (has no administration in time range)    Or  ondansetron (ZOFRAN) injection 4 mg (has no administration in time range)  HYDROmorphone (DILAUDID) injection 1 mg (has no administration in time range)  morphine 4 MG/ML injection 4 mg (4 mg Intravenous Given 11/18/18 0103)  ondansetron (ZOFRAN) injection 4 mg (4 mg Intravenous Given 11/17/18 2313)    Mobility non-ambulatory Moderate fall risk   Focused Assessments Cardiac Assessment Handoff:    Lab Results  Component Value Date   TROPONINI <0.03 06/27/2017   No results found for: DDIMER Does the Patient currently have chest pain? No     R Recommendations: See Admitting Provider Note  Report given to: Neoma Laming RN   Additional Notes:

## 2018-11-18 NOTE — Progress Notes (Signed)
[place] [facility] [place] NAME: Sarah Mckee    MR#:  [id number]  DATE OF BIRTH:  1946-03-01  SUBJECTIVE:  CHIEF COMPLAINT:   Chief Complaint  Patient presents with  . Fall  . Hip Pain   -Patient lives at home, had a fall and comes with right hip pain -X-ray showing right femur fracture and possibly going for surgery today  REVIEW OF SYSTEMS:  Review of Systems  Constitutional: Positive for malaise/fatigue. Negative for chills and fever.  HENT: Negative for congestion, ear discharge, hearing loss and nosebleeds.   Eyes: Negative for blurred vision and double vision.  Respiratory: Positive for shortness of breath. Negative for cough and wheezing.   Cardiovascular: Negative for chest pain and palpitations.  Gastrointestinal: Negative for abdominal pain, constipation, diarrhea, nausea and vomiting.  Genitourinary: Negative for dysuria.  Musculoskeletal: Positive for joint pain and myalgias.  Neurological: Positive for weakness. Negative for dizziness, speech change, focal weakness, seizures and headaches.  Psychiatric/Behavioral: Negative for depression.    DRUG ALLERGIES:   Allergies  Allergen Reactions  . Codeine Shortness Of Breath  . Demerol [Meperidine] Shortness Of Breath  . Morphine And Related Shortness Of Breath  . Oxycodone Shortness Of Breath  . Alendronate Other (See Comments)    Other reaction(s): Other (See Comments) esophagitis  . Amoxicillin-Pot Clavulanate Other (See Comments)    Unknown Has patient had a PCN reaction causing immediate rash, facial/tongue/throat swelling, SOB or lightheadedness with hypotension: Unknown Has patient had a PCN reaction causing severe rash involving mucus membranes or skin necrosis: Unknown Has patient had a PCN reaction that required hospitalization: Unknown Has patient had a PCN reaction occurring within the last 10 years: Unknown If all of the above answers are "NO", then may  proceed with Cephalosporin use.   Marland Kitchen Risedronate Other (See Comments)    Other reaction(s): Other (See Comments) epigastric pain  . Shellfish Allergy Nausea And Vomiting    VITALS:  Blood pressure 129/74, pulse (!) 112, temperature (!) 97.5 F (36.4 C), resp. rate 18, height 5\' 2"  (1.575 m), weight 87.1 kg, SpO2 (!) 89 %.  PHYSICAL EXAMINATION:  Physical Exam   GENERAL:  73 y.o.-year-old patient lying in the bed with no acute distress.  EYES: Pupils equal, round, reactive to light and accommodation. No scleral icterus. Extraocular muscles intact.  HEENT: Head atraumatic, normocephalic. Oropharynx and nasopharynx clear.  NECK:  Supple, no jugular venous distention. No thyroid enlargement, no tenderness.  LUNGS: Normal breath sounds bilaterally, no wheezing, rales,rhonchi or crepitation.  Decreased bibasilar breath sounds.  No use of accessory muscles of respiration.  CARDIOVASCULAR: S1, S2 normal. No  rubs, or gallops.  2/6 systolic murmur is present ABDOMEN: Soft, nontender, nondistended. Bowel sounds present. No organomegaly or mass.  EXTREMITIES: No pedal edema, cyanosis, or clubbing.  Right leg slightly abducted NEUROLOGIC: Cranial nerves II through XII are intact. Muscle strength 5/5 in all extremities except right lower extremity due to pain. Sensation intact. Gait not checked.  PSYCHIATRIC: The patient is alert and oriented, slightly groggy from pain medication.  SKIN: No obvious rash, lesion, or ulcer.    LABORATORY PANEL:   CBC Recent Labs  Lab 11/18/18 0528  WBC 16.0*  HGB 11.9*  HCT 39.3  PLT 149*   ------------------------------------------------------------------------------------------------------------------  Chemistries  Recent Labs  Lab 11/17/18 2234 11/18/18 0528  NA 141 141  K 3.9 4.0  CL 95* 95*  CO2 39* 40*  GLUCOSE 124* 154*  BUN  16 16  CREATININE 0.58 0.53  CALCIUM 9.0 8.5*  AST 28  --   ALT 36  --   ALKPHOS 83  --   BILITOT 0.7  --     ------------------------------------------------------------------------------------------------------------------  Cardiac Enzymes No results for input(s): TROPONINI in the last 168 hours. ------------------------------------------------------------------------------------------------------------------  RADIOLOGY:  Dg Chest Port 1 View  Result Date: 11/17/2018 CLINICAL DATA:  73 year old female status post fall.  Hip fracture. EXAM: PORTABLE CHEST 1 VIEW COMPARISON:  Chest radiographs 06/27/2017 and earlier. FINDINGS: AP supine view at 2322 hours. Chronic large lung volumes with emphysema demonstrated on 2018 chest CT. No pneumothorax, pulmonary edema, pleural effusion or acute pulmonary opacity. Normal cardiac size and mediastinal contours. Visualized tracheal air column is within normal limits. Negative visible bowel gas pattern. IMPRESSION: Emphysema (ICD10-J43.9). No acute cardiopulmonary abnormality. Electronically Signed   By: Odessa FlemingH  Hall M.D.   On: 11/17/2018 23:40   Dg Hip Unilat W Or Wo Pelvis 2-3 Views Right  Result Date: 11/17/2018 CLINICAL DATA:  73 year old female status post fall. Hip pain. EXAM: DG HIP (WITH OR WITHOUT PELVIS) 2-3V RIGHT COMPARISON:  CT Abdomen and Pelvis 04/01/2016. FINDINGS: Chronic bilateral femoral head AVN as seen on the comparison CT. There is a comminuted acute intertrochanteric fracture of the right femur with varus impaction. The right femoral head remains normally located. The proximal left femur appears grossly intact. Chronic right inferior pubic ramus fracture. No acute pelvis fracture identified. Negative visible bowel gas pattern. IMPRESSION: 1. Comminuted acute intertrochanteric fracture of the right femur with varus impaction. 2. Chronic bilateral femoral head AVN. Chronic right inferior pubic ramus fracture. Electronically Signed   By: Odessa FlemingH  Hall M.D.   On: 11/17/2018 23:42    EKG:   Orders placed or performed during the hospital encounter of 11/17/18  .  ED EKG  . ED EKG  . EKG 12-Lead  . EKG 12-Lead    ASSESSMENT AND PLAN:   73 year old female with chronic respiratory failure secondary to COPD on 3 L home oxygen, hypertension, lymphedema, pulmonary hypertension presents to hospital after mechanical fall and right hip pain.  1.  Right hip intertrochanteric fracture-secondary to mechanical fall -Appreciate orthopedics consult.  Patient going for surgery today.  Patient has history of chronic bilateral femoral head avascular necrosis. -Continue pain control.  Postoperatively will need DVT prophylaxis and physical therapy  2.  Chronic respiratory failure-secondary to COPD.  Monitor closely in the postop period.  On 3 L home oxygen all the time -Continue inhalers, nebulizers.  No acute exacerbation identified -Lasix PRN  3.  Hypertension-on Maxzide  4.  Depression-Zoloft  5.  DVT prophylaxis-will be started after surgery     All the records are reviewed and case discussed with Care Management/Social Workerr. Management plans discussed with the patient, family and they are in agreement.  CODE STATUS: Full code  TOTAL TIME TAKING CARE OF THIS PATIENT: 37 minutes.   POSSIBLE D/C IN 3 DAYS, DEPENDING ON CLINICAL CONDITION.   Enid Baasadhika Naziah Portee M.D on 11/18/2018 at 11:01 AM  Between 7am to 6pm - Pager - [phone]  After 6pm go to [email or web address] - Social research officer, governmentpassword EPAS [facility]  [facility] [place] [facility]  Office  [phone]  CC: Primary care physician; Patrice ParadiseMcLaughlin, Miriam K, MD

## 2018-11-18 NOTE — ED Notes (Signed)
Pts daughter called for an update.. Pt gave verbal permission to speak to daughter and give complete information about her care.

## 2018-11-18 NOTE — Progress Notes (Signed)
Updated patient's daughter Ms. Christine over the phone this afternoon.  Discussed all concerns at this time.  She is expecting call after surgery from either the surgeon or nurse about further details.

## 2018-11-18 NOTE — TOC Benefit Eligibility Note (Signed)
Transition of Care [facility]) Benefit Eligibility Note    Patient Details  Name: Sarah Mckee MRN: [id number] Date of Birth: 03/05/1946   Medication/Dose: Enoxaparin 40 mg daily x 14 days     Tier: Other(Tier 4 - Enoxaparin)  Prescription Coverage Preferred Pharmacy: Any in-network  Spoke with Person/Company/Phone Number:: [place], [place], [phone]  Co-Pay: $202.77 for Enoxaparin 40 mg daily x 14 days - Pt in coverage gap so would pay 25% of retail  Prior Approval: No(Not needed for Enoxaparin)  Deductible: Unmet  Additional Notes: Lovenox not on formulary but patient can request a non-formulary exception. Representative unable to provide a co-pay.    [place] Phone Number: 11/18/2018, 12:21 PM

## 2018-11-18 NOTE — H&P (Signed)
[employer] [facility] [place] [facility]   PATIENT NAME: Sarah Mckee    MR#:  [id number]  DATE OF BIRTH:  1946-04-22  DATE OF ADMISSION: 11/18/2018  PRIMARY CARE PHYSICIAN: Patrice ParadiseMcLaughlin, Miriam K, MD   REQUESTING/REFERRING PHYSICIAN: Loleta RoseForbach, Cory, MD  CHIEF COMPLAINT:   Chief Complaint  Patient presents with   Fall   Hip Pain    HISTORY OF PRESENT ILLNESS:  Sarah Mckee  is a 73 y.o. Caucasian female with a known history of COPD and hypertension, who presented to the emergency room with concern of right hip pain after having an accidental mechanical fall.  She denied any headache or dizziness, presyncope or syncope.  No chest pain or dyspnea or palpitations.  No recent worsening cough or wheezing or shortness of breath.  No fever or chills.  No recent sick exposures.  Upon presentation to the emergency room, heart rate was 101 and pulse ox entry was 98% on 3 L of O2 by nasal cannula which is her baseline with otherwise  normal ital signs.  Labs were remarkable for macrocytosis, a CO2 39 and chloride of 95.  Her COVID-19 test came back negative.  Portable chest x-ray showed emphysema with no acute cardiopulmonary disease.  Hip x-ray revealed comminuted acute intertrochanteric fracture of the right femur with varus impaction and chronic bilateral femoral head AVN with chronic right inferior pubic ramus fracture.  The patient was given 4 mg of IV morphine sulfate and 4 mg of IV Zofran as well as hydrated with IV normal saline.  She will be admitted to surgical bed for further evaluation and management.  PAST MEDICAL HISTORY:   Past Medical History:  Diagnosis Date   Arthritis    COPD (chronic obstructive pulmonary disease) (HCC)    2L 02 at baseline   Hypertension    Lymphedema of left leg    Lymphedema of right lower extremity    Pulmonary HTN (HCC)     PAST SURGICAL HISTORY:   Past Surgical History:  Procedure Laterality Date   CATARACT EXTRACTION  W/PHACO Left 04/28/2017   Procedure: CATARACT EXTRACTION PHACO AND INTRAOCULAR LENS PLACEMENT (IOC);  Surgeon: Galen ManilaPorfilio, William, MD;  Location: [facility] ORS;  Service: Ophthalmology;  Laterality: Left;  US 01:00.3 AP% 19.7 CDE 11.88 Fluid Pack lot # [id number] H   none     PARS PLANA VITRECTOMY W/ REPAIR OF MACULAR HOLE     TUBAL LIGATION      SOCIAL HISTORY:   Social History   Tobacco Use   Smoking status: Former Smoker    Quit date: 05/20/2007    Years since quitting: 11.5   Smokeless tobacco: Never Used  Substance Use Topics   Alcohol use: No    FAMILY HISTORY:   Family History  Problem Relation Age of Onset   Heart attack Mother    Diabetes Mother    Cancer Father    Thyroid disease Father    Diabetes Sister    Diabetes Brother     DRUG ALLERGIES:   Allergies  Allergen Reactions   Codeine Shortness Of Breath   Demerol [Meperidine] Shortness Of Breath   Morphine And Related Shortness Of Breath   Oxycodone Shortness Of Breath   Alendronate Other (See Comments)    Other reaction(s): Other (See Comments) esophagitis   Amoxicillin-Pot Clavulanate Other (See Comments)    Unknown Has patient had a PCN reaction causing immediate rash, facial/tongue/throat swelling, SOB or lightheadedness with hypotension: Unknown Has patient had a PCN reaction  causing severe rash involving mucus membranes or skin necrosis: Unknown Has patient had a PCN reaction that required hospitalization: Unknown Has patient had a PCN reaction occurring within the last 10 years: Unknown If all of the above answers are "NO", then may proceed with Cephalosporin use.    Risedronate Other (See Comments)    Other reaction(s): Other (See Comments) epigastric pain   Shellfish Allergy Nausea And Vomiting    REVIEW OF SYSTEMS:   ROS As per history of present illness. All pertinent systems were reviewed above. Constitutional,  HEENT, cardiovascular, respiratory, GI, GU, musculoskeletal,  neuro, psychiatric, endocrine,  integumentary and hematologic systems were reviewed and are otherwise  negative/unremarkable except for positive findings mentioned above in the HPI.   MEDICATIONS AT HOME:   Prior to Admission medications   Medication Sig Start Date End Date Taking? Authorizing Provider  acetaminophen (TYLENOL) 500 MG tablet Take 500-1,000 mg by mouth See admin instructions. Take 500 mg by mouth in the morning/afternoon and take 1000 mg by mouth in the evening   Yes [provider]  albuterol (PROVENTIL) (2.5 MG/3ML) 0.083% nebulizer solution USE 1 VIAL IN NEBULIZER EVERY 6 HOURS AS NEEDED FOR WHEEZING OR SHORTNESS OF BREATH 03/19/18  Yes Kasa, Wallis BambergKurian, MD  ALPRAZolam Prudy Feeler(XANAX) 0.25 MG tablet Take 0.25 mg by mouth 2 (two) times daily as needed for anxiety.  07/18/15  Yes [provider]  aluminum-magnesium hydroxide-simethicone (MAALOX) 200-200-20 MG/5ML SUSP Take 30 mLs by mouth 4 (four) times daily -  before meals and at bedtime. Patient taking differently: Take 30 mLs by mouth 4 (four) times daily as needed (for indegestion).  04/01/16  Yes Sharman CheekStafford, Phillip, MD  ASPERCREME LIDOCAINE EX Apply 1 application topically as needed (for hand/shoulder pain).   Yes [provider]  aspirin 81 MG tablet Take 81 mg by mouth daily.   Yes [provider]  budesonide (PULMICORT) 0.5 MG/2ML nebulizer solution Inhale 2 mLs into the lungs 2 (two) times daily. 12/29/17 12/29/18 Yes [provider]  Cholecalciferol (VITAMIN D) 400 UNIT/ML LIQD Take by mouth. Pt taking pill form,   2 tablets daily   Yes [provider]  fluticasone furoate-vilanterol (BREO ELLIPTA) 200-25 MCG/INH AEPB Inhale 1 puff into the lungs daily. 07/26/18  Yes Erin FullingKasa, Kurian, MD  furosemide (LASIX) 40 MG tablet Take 40 mg by mouth daily as needed for fluid.   Yes [provider]  HYDROmorphone (DILAUDID) 2 MG tablet Take 1 tablet (2 mg total) by mouth every 6 (six) hours  as needed for moderate pain or severe pain. 06/30/17  Yes Houston SirenSainani, Vivek J, MD  nystatin cream (MYCOSTATIN) Apply 1 application topically 2 (two) times daily. 01/14/18 01/14/19 Yes [provider]  OXYGEN Inhale into the lungs continuous.   Yes [provider]  Polyethyl Glycol-Propyl Glycol (SYSTANE OP) Place 1 drop into both eyes daily.   Yes [provider]  potassium chloride SA (K-DUR,KLOR-CON) 20 MEQ tablet Take 20 mEq by mouth daily.    Yes [provider]  PROAIR HFA 108 [phone](90 Base) MCG/ACT inhaler Inhale 2 puffs into the lungs every 4 (four) hours as needed for wheezing or shortness of breath.  06/13/15  Yes [provider]  sertraline (ZOLOFT) 25 MG tablet Take 50 mg by mouth daily. 01/14/18 01/14/19 Yes [provider]  Tiotropium Bromide Monohydrate (SPIRIVA RESPIMAT) 2.5 MCG/ACT AERS INHALE 2 PUFFS INTO THE LUNGS BY DAILY 07/07/18  Yes Kasa, Wallis BambergKurian, MD  triamterene-hydrochlorothiazide (MAXZIDE-25) 37.5-25 MG tablet Take 1  tablet by mouth daily. 08/06/15  Yes [provider]      VITAL SIGNS:  Blood pressure 127/70, pulse 94, temperature 97.8 F (36.6 C), temperature source Oral, resp. rate 18, height 5\' 2"  (1.575 m), weight 83 kg, SpO2 97 %.  PHYSICAL EXAMINATION:  Physical Exam  GENERAL:  73 y.o.-year-old Caucasian female patient lying in the bed with no acute distress.  EYES: Pupils equal, round, reactive to light and accommodation. No scleral icterus. Extraocular muscles intact.  HEENT: Head atraumatic, normocephalic. Oropharynx and nasopharynx clear.  NECK:  Supple, no jugular venous distention. No thyroid enlargement, no tenderness.  LUNGS: Normal breath sounds bilaterally, no wheezing, rales,rhonchi or crepitation. No use of accessory muscles of respiration.  CARDIOVASCULAR: Regular rate and rhythm, S1, S2 normal. No murmurs, rubs, or gallops.  ABDOMEN: Soft, nondistended, nontender. Bowel sounds present. No organomegaly  or mass.  EXTREMITIES: Trace bilateral lower extremity edema, with no cyanosis, or clubbing.  NEUROLOGIC: Cranial nerves II through XII are intact. Muscle strength 5/5 in all extremities. Sensation intact. Gait not checked. Musculoskeletal: Right hip tenderness. PSYCHIATRIC: The patient is alert and oriented x 3.  Normal affect and good eye contact. SKIN: No obvious rash, lesion, or ulcer.   LABORATORY PANEL:   CBC Recent Labs  Lab 11/17/18 2234  WBC 9.2  HGB 12.4  HCT 40.4  PLT 161   ------------------------------------------------------------------------------------------------------------------  Chemistries  Recent Labs  Lab 11/17/18 2234  NA 141  K 3.9  CL 95*  CO2 39*  GLUCOSE 124*  BUN 16  CREATININE 0.58  CALCIUM 9.0  AST 28  ALT 36  ALKPHOS 83  BILITOT 0.7   ------------------------------------------------------------------------------------------------------------------  Cardiac Enzymes No results for input(s): TROPONINI in the last 168 hours. ------------------------------------------------------------------------------------------------------------------  RADIOLOGY:  Dg Chest Port 1 View  Result Date: 11/17/2018 CLINICAL DATA:  73 year old female status post fall.  Hip fracture. EXAM: PORTABLE CHEST 1 VIEW COMPARISON:  Chest radiographs 06/27/2017 and earlier. FINDINGS: AP supine view at 2322 hours. Chronic large lung volumes with emphysema demonstrated on 2018 chest CT. No pneumothorax, pulmonary edema, pleural effusion or acute pulmonary opacity. Normal cardiac size and mediastinal contours. Visualized tracheal air column is within normal limits. Negative visible bowel gas pattern. IMPRESSION: Emphysema (ICD10-J43.9). No acute cardiopulmonary abnormality. Electronically Signed   By: Genevie Ann M.D.   On: 11/17/2018 23:40   Dg Hip Unilat W Or Wo Pelvis 2-3 Views Right  Result Date: 11/17/2018 CLINICAL DATA:  73 year old female status post fall. Hip pain. EXAM:  DG HIP (WITH OR WITHOUT PELVIS) 2-3V RIGHT COMPARISON:  CT Abdomen and Pelvis 04/01/2016. FINDINGS: Chronic bilateral femoral head AVN as seen on the comparison CT. There is a comminuted acute intertrochanteric fracture of the right femur with varus impaction. The right femoral head remains normally located. The proximal left femur appears grossly intact. Chronic right inferior pubic ramus fracture. No acute pelvis fracture identified. Negative visible bowel gas pattern. IMPRESSION: 1. Comminuted acute intertrochanteric fracture of the right femur with varus impaction. 2. Chronic bilateral femoral head AVN. Chronic right inferior pubic ramus fracture. Electronically Signed   By: Genevie Ann M.D.   On: 11/17/2018 23:42      IMPRESSION AND PLAN:   1.  Right hip fracture secondary to mechanical fall.  The patient will be admitted to surgical bed.  Pain management will be provided.  Orthopedic consultation will be obtained by Dr. Rudene Christians who was notified about the patient.  She has no history of CHF, coronary artery disease, diabetes  mellitus on insulin or CVA or renal failure.  She is considered at average risk for her age for perioperative cardiovascular events per the revised cardiac risk index.  She has no active COPD exacerbation.  She was told however in the past that she had an operable gallstones and that she cannot tolerate spinal anesthesia.  Will defer safest anesthesia method to the anesthesiologist and operative procedure to our orthopedist and continue to monitor her from medical standpoint.  2.  COPD.  No current exacerbation.  We will continue her nebulized albuterol and Pulmicort and hold off her Breo.  Her Spiriva will be resumed.  3.  Depression with anxiety.  Zoloft and PRN Xanax will be continued.  4.  Hypertension.  Blood pressures been fairly controlled with pain management.  Will place on PRN IV labetalol.  5.  DVT prophylaxis.  This will be provided with SCDs for now pending operative  procedure decision.   All the records are reviewed and case discussed with ED provider. The plan of care was discussed in details with the patient (and family). I answered all questions. The patient agreed to proceed with the above mentioned plan. Further management will depend upon hospital course.   CODE STATUS: Full code  TOTAL TIME TAKING CARE OF THIS PATIENT: 50 minutes.    Hannah BeatJan A Karee Christopherson M.D on 11/18/2018 at 2:39 AM  Pager - [phone]  After 6pm go to [email or web address] - Social research officer, governmentpassword EPAS [facility]  [facility]  Office  [phone]  CC: Primary care physician; Patrice ParadiseMcLaughlin, Miriam K, MD   Note: This dictation was prepared with Dragon dictation along with smaller phrase technology. Any transcriptional errors that result from this process are unintentional.

## 2018-11-18 NOTE — Anesthesia Post-op Follow-up Note (Signed)
Anesthesia QCDR form completed.        

## 2018-11-18 NOTE — Transfer of Care (Signed)
Immediate Anesthesia Transfer of Care Note  Patient: Sarah Mckee  Procedure(s) Performed: INTRAMEDULLARY (IM) NAIL INTERTROCHANTRIC (Right Hip)  Patient Location: PACU  Anesthesia Type:Spinal    Level of Consciousness: sedated  Airway & Oxygen Therapy: Patient Spontanous Breathing and Patient connected to face mask oxygen  Post-op Assessment: Post -op Vital signs reviewed and unstable, Anesthesiologist notified  Post vital signs: stable  Last Vitals:  Vitals Value Taken Time  BP 120/60 11/18/18 1601  Temp 36.4 C 11/18/18 1555  Pulse 84 11/18/18 1603  Resp 8 11/18/18 1603  SpO2 100 % 11/18/18 1603  Vitals shown include unvalidated device data.  Last Pain:  Vitals:   11/18/18 1555  TempSrc:   PainSc: 0-No pain         Complications: No apparent anesthesia complications

## 2018-11-18 NOTE — ED Notes (Signed)
Pt transported to rm 154

## 2018-11-19 LAB — CBC
HCT: 31.2 % — ABNORMAL LOW (ref 36.0–46.0)
Hemoglobin: 9.5 g/dL — ABNORMAL LOW (ref 12.0–15.0)
MCH: 30.8 pg (ref 26.0–34.0)
MCHC: 30.4 g/dL (ref 30.0–36.0)
MCV: 101.3 fL — ABNORMAL HIGH (ref 80.0–100.0)
Platelets: 106 10*3/uL — ABNORMAL LOW (ref 150–400)
RBC: 3.08 MIL/uL — ABNORMAL LOW (ref 3.87–5.11)
RDW: 13.2 % (ref 11.5–15.5)
WBC: 9.3 10*3/uL (ref 4.0–10.5)
nRBC: 0 % (ref 0.0–0.2)

## 2018-11-19 LAB — BASIC METABOLIC PANEL
Anion gap: 8 (ref 5–15)
BUN: 30 mg/dL — ABNORMAL HIGH (ref 8–23)
CO2: 34 mmol/L — ABNORMAL HIGH (ref 22–32)
Calcium: 8.1 mg/dL — ABNORMAL LOW (ref 8.9–10.3)
Chloride: 99 mmol/L (ref 98–111)
Creatinine, Ser: 0.9 mg/dL (ref 0.44–1.00)
GFR calc Af Amer: >60 mL/min (ref >60)
GFR calc non Af Amer: >60 mL/min (ref >60)
Glucose, Bld: 162 mg/dL — ABNORMAL HIGH (ref 70–99)
Potassium: 4.5 mmol/L (ref 3.5–5.1)
Sodium: 141 mmol/L (ref 135–145)

## 2018-11-19 NOTE — Progress Notes (Signed)
Duoneb changed to BID per pt request to coincide with her home regimen. She reports no SOB and CXR shows no acute process.

## 2018-11-19 NOTE — NC FL2 (Signed)
[place] MEDICAID FL2 LEVEL OF CARE SCREENING TOOL     IDENTIFICATION  Patient Name: Sarah Mckee Birthdate: 1945/06/27 Sex: female Admission Date (Current Location): 11/17/2018  [place]   Facility and Address:  [facility], [address], [place], [place] [address]      Provider Number: [id number]  Attending Physician Name and Address:  Enid BaasKalisetti, Radhika, MD  Relative Name and Phone Number:  Wynona CanesChristine [phone]    Current Level of Care: Hospital Recommended Level of Care: Skilled Nursing Facility Prior Approval Number:    Date Approved/Denied:   PASRR Number: [id number]  Discharge Plan: SNF    Current Diagnoses: Patient Active Problem List   Diagnosis Date Noted  . Closed right hip fracture (HCC) 11/18/2018  . COPD with acute exacerbation (HCC) 06/27/2017  . Solitary pulmonary nodule-LLL 04/17/2016  . COPD exacerbation (HCC) 11/06/2015  . Chronic respiratory failure (HCC) 10/03/2015  . COPD with hypoxia (HCC) 10/03/2015  . Acute respiratory failure (HCC) 08/11/2015    Orientation RESPIRATION BLADDER Height & Weight     Self, Time, Situation, Place  O2(3 liters chronic O2) Continent Weight: 87.1 kg Height:  5\' 2"  (157.5 cm)  BEHAVIORAL SYMPTOMS/MOOD NEUROLOGICAL BOWEL NUTRITION STATUS      Continent Diet  AMBULATORY STATUS COMMUNICATION OF NEEDS Skin   Extensive Assist Verbally Normal, Surgical wounds, Bruising                       Personal Care Assistance Level of Assistance  Bathing, Dressing Bathing Assistance: Limited assistance   Dressing Assistance: Limited assistance     Functional Limitations Info  Sight, Hearing, Speech Sight Info: Adequate Hearing Info: Adequate Speech Info: Adequate    SPECIAL CARE FACTORS FREQUENCY  PT (By licensed PT)     PT Frequency: 5 times per week              Contractures Contractures Info: Not present    Additional Factors Info  Code  Status Code Status Info: full             Current Medications (11/19/2018):  This is the current hospital active medication list Current Facility-Administered Medications  Medication Dose Route Frequency Provider Last Rate Last Dose  . 0.9 %  sodium chloride infusion   Intravenous Continuous Kennedy BuckerMenz, Michael, MD 75 mL/hr at 11/19/18 0400    . acetaminophen (TYLENOL) tablet 650 mg  650 mg Oral Q6H PRN Kennedy BuckerMenz, Michael, MD   650 mg at 11/18/18 2329   Or  . acetaminophen (TYLENOL) suppository 650 mg  650 mg Rectal Q6H PRN Kennedy BuckerMenz, Michael, MD      . albuterol (PROVENTIL) (2.5 MG/3ML) 0.083% nebulizer solution 2.5 mg  2.5 mg Nebulization Q4H PRN Kennedy BuckerMenz, Michael, MD      . ALPRAZolam Prudy Feeler(XANAX) tablet 0.25 mg  0.25 mg Oral BID PRN Kennedy BuckerMenz, Michael, MD      . alum & mag hydroxide-simeth (MAALOX/MYLANTA) 200-200-20 MG/5ML suspension 30 mL  30 mL Oral QID PRN Kennedy BuckerMenz, Michael, MD      . bisacodyl (DULCOLAX) suppository 10 mg  10 mg Rectal Daily PRN Kennedy BuckerMenz, Michael, MD      . budesonide (PULMICORT) nebulizer solution 0.25 mg  2 mL Inhalation BID Kennedy BuckerMenz, Michael, MD   0.25 mg at 11/19/18 0807  . cholecalciferol (VITAMIN D3) tablet 2,000 Units  2,000 Units Oral Daily Kennedy BuckerMenz, Michael, MD   2,000 Units at 11/19/18 0905  . docusate sodium (COLACE) capsule 100 mg  100 mg Oral BID Hessie Knows, MD   100 mg at 11/19/18 5053  . enoxaparin (LOVENOX) injection 40 mg  40 mg Subcutaneous Q24H Hessie Knows, MD   40 mg at 11/19/18 0910  . furosemide (LASIX) tablet 40 mg  40 mg Oral Daily PRN Hessie Knows, MD      . HYDROmorphone (DILAUDID) injection 1 mg  1 mg Intravenous Q4H PRN Hessie Knows, MD   1 mg at 11/19/18 0912  . HYDROmorphone (DILAUDID) tablet 2 mg  2 mg Oral Q6H PRN Hessie Knows, MD   2 mg at 11/19/18 1202  . ipratropium-albuterol (DUONEB) 0.5-2.5 (3) MG/3ML nebulizer solution 3 mL  3 mL Nebulization BID Gladstone Lighter, MD   3 mL at 11/19/18 0807  . ketorolac (TORADOL) 30 MG/ML injection 15 mg  15 mg Intravenous Q6H PRN  Hessie Knows, MD      . lactated ringers infusion   Intravenous Continuous Hessie Knows, MD 75 mL/hr at 11/18/18 1412    . magnesium citrate solution 1 Bottle  1 Bottle Oral Once PRN Hessie Knows, MD      . magnesium hydroxide (MILK OF MAGNESIA) suspension 30 mL  30 mL Oral Daily PRN Hessie Knows, MD      . menthol-cetylpyridinium (CEPACOL) lozenge 3 mg  1 lozenge Oral PRN Hessie Knows, MD       Or  . phenol (CHLORASEPTIC) mouth spray 1 spray  1 spray Mouth/Throat PRN Hessie Knows, MD      . metoCLOPramide (REGLAN) tablet 5-10 mg  5-10 mg Oral Q8H PRN Hessie Knows, MD       Or  . metoCLOPramide (REGLAN) injection 5-10 mg  5-10 mg Intravenous Q8H PRN Hessie Knows, MD      . ondansetron [facility]) tablet 4 mg  4 mg Oral Q6H PRN Hessie Knows, MD       Or  . ondansetron [facility]) injection 4 mg  4 mg Intravenous Q6H PRN Hessie Knows, MD      . polyvinyl alcohol (LIQUIFILM TEARS) 1.4 % ophthalmic solution 1 drop  1 drop Both Eyes Daily Hessie Knows, MD   1 drop at 11/19/18 1159  . potassium chloride SA (K-DUR) CR tablet 20 mEq  20 mEq Oral Daily Hessie Knows, MD   20 mEq at 11/19/18 0907  . sertraline (ZOLOFT) tablet 50 mg  50 mg Oral Daily Hessie Knows, MD   50 mg at 11/19/18 0905  . tiotropium (SPIRIVA) inhalation capsule ([facility] use ONLY) 18 mcg  18 mcg Inhalation Daily Hessie Knows, MD   18 mcg at 11/19/18 1200  . traZODone (DESYREL) tablet 25 mg  25 mg Oral QHS PRN Hessie Knows, MD      . triamterene-hydrochlorothiazide (MAXZIDE-25) 37.5-25 MG per tablet 1 tablet  1 tablet Oral Daily Hessie Knows, MD         Discharge Medications: Please see discharge summary for a list of discharge medications.  Relevant Imaging Results:  Relevant Lab Results:   Additional Information [id number]  Su Hilt, RN

## 2018-11-19 NOTE — TOC Initial Note (Signed)
Transition of Care [facility]) - Initial/Assessment Note    Patient Details  Name: Sarah Mckee MRN: [id number] Date of Birth: [birth year]  Transition of Care [facility] CM/SW Contact:    Su Hilt, RN Phone Number: 11/19/2018, 12:07 PM  Clinical Narrative:                 Met with the patient to discuss DC plan and needs She lives at home with her spouse and her daughter helps out also She wants to use [facility] as she has used them in the past I notified Corene Cornea with [facility] via email She has all of the DME equipment at home that she needs She is going to talk to her daughter about going to rehab but wants to go home, She gives permission to do FL2 and bed search  Continue to monitor for any needs   Expected Discharge Plan: [place] Barriers to Discharge: Continued Medical Work up   Patient Goals and CMS Choice Patient states their goals for this hospitalization and ongoing recovery are:: go home with home health CMS Medicare.gov Compare Post Acute Care list provided to:: Patient Choice offered to / list presented to : Patient  Expected Discharge Plan and Services Expected Discharge Plan: [place]   Discharge Planning Services: CM Consult Post Acute Care Choice: [place] arrangements for the past 2 months: Single Family Home                 DME Arranged: N/A         HH Arranged: PT HH Agency: [place] ([place]) Date [place]: 11/19/18 Time [address]: 1206 Representative spoke with at [place]: Corene Cornea  Prior Living Arrangements/Services Living arrangements for the past 2 months: [place] with:: Spouse Patient language and need for interpreter reviewed:: No Do you feel safe going back to the place where you live?: Yes      Need for Family Participation in Patient Care: No (Comment) Care giver support system in place?: Yes (comment) Current home services:  DME(walker, shower seat, cane, BSC) Criminal Activity/Legal Involvement Pertinent to Current Situation/Hospitalization: No - Comment as needed  Activities of Daily Living Home Assistive Devices/Equipment: Walker (specify type) ADL Screening (condition at time of admission) Patient's cognitive ability adequate to safely complete daily activities?: Yes Is the patient deaf or have difficulty hearing?: No Does the patient have difficulty seeing, even when wearing glasses/contacts?: No Does the patient have difficulty concentrating, remembering, or making decisions?: Yes Patient able to express need for assistance with ADLs?: Yes Does the patient have difficulty dressing or bathing?: No Independently performs ADLs?: No Communication: Independent Dressing (OT): Needs assistance Is this a change from baseline?: Pre-admission baseline Grooming: Needs assistance Is this a change from baseline?: Pre-admission baseline Feeding: Independent Bathing: Needs assistance Is this a change from baseline?: Pre-admission baseline Toileting: Needs assistance Is this a change from baseline?: Pre-admission baseline In/Out Bed: Needs assistance Is this a change from baseline?: Pre-admission baseline Walks in Home: Needs assistance Is this a change from baseline?: Pre-admission baseline Does the patient have difficulty walking or climbing stairs?: Yes Weakness of Legs: Both Weakness of Arms/Hands: Both  Permission Sought/Granted Permission sought to share information with : Case Manager Permission granted to share information with : Yes, Verbal Permission Granted     Permission granted to share info w AGENCY: [facility]        Emotional Assessment Appearance:: Appears  stated age Attitude/Demeanor/Rapport: Engaged Affect (typically observed): Accepting Orientation: : Oriented to Self, Oriented to Place, Oriented to  Time, Oriented to Situation Alcohol / Substance Use: Not Applicable Psych  Involvement: No (comment)  Admission diagnosis:  History of pelvic fracture [Z87.81] Fall, initial encounter [W19.XXXA] Avascular necrosis of femur, unspecified laterality ([place]) [M87.059] COPD, severe ([place]) [J44.9] Closed comminuted intertrochanteric fracture of proximal end of right femur, initial encounter [facility]) [S72.141A] Patient Active Problem List   Diagnosis Date Noted  . Closed right hip fracture ([place]) 11/18/2018  . COPD with acute exacerbation ([place]) 06/27/2017  . Solitary pulmonary nodule-LLL 04/17/2016  . COPD exacerbation ([place]) 11/06/2015  . Chronic respiratory failure ([place]) 10/03/2015  . COPD with hypoxia ([place]) 10/03/2015  . Acute respiratory failure ([place]) 08/11/2015   PCP:  Marinda Elk, MD Pharmacy:   [facility] [address] ([address]), [place] [address] [place]) [place] [address] Phone: [phone] Fax: [phone]     Social Determinants of Health (SDOH) Interventions    Readmission Risk Interventions No flowsheet data found.

## 2018-11-19 NOTE — Anesthesia Postprocedure Evaluation (Signed)
Anesthesia Post Note  Patient: Sarah Mckee  Procedure(s) Performed: INTRAMEDULLARY (IM) NAIL INTERTROCHANTRIC (Right Hip)  Patient location during evaluation: Other Anesthesia Type: Spinal Level of consciousness: awake and alert Pain management: pain level controlled Respiratory status: spontaneous breathing, nonlabored ventilation and respiratory function stable Cardiovascular status: stable Postop Assessment: no apparent nausea or vomiting Anesthetic complications: no     Last Vitals:  Vitals:   11/19/18 0831 11/19/18 1556  BP: 118/60 130/70  Pulse: (!) 105 (!) 102  Resp: 18 18  Temp: 36.8 C 36.7 C  SpO2: 97% 95%    Last Pain:  Vitals:   11/19/18 1302  TempSrc:   PainSc: [place]

## 2018-11-19 NOTE — Evaluation (Signed)
Occupational Therapy Evaluation Patient Details Name: Sarah MedalJanet Bendall MRN: [id number] DOB: 1945/11/16 Today's Date: 11/19/2018    History of Present Illness 73 year old female with chronic respiratory failure secondary to COPD on 3 L home oxygen, hypertension, lymphedema, pulmonary hypertension presents to hospital after mechanical fall and right hip pain. Pt s/p R IM nail on 7/2.   Clinical Impression   Pt seen for OT evaluation this date, POD#1 from above surgery. Pt endorses requiring at least some assistance from her family for ADL tasks (particularly bathing) as she fatigues quickly with minimal exertion at baseline. When ambulating, she uses a RW for household distances and a SPC in the community however she states she does not move much as of late. Pt is eager to return to PLOF with less pain and improved safety and independence. Pt currently requires Max assist for LB dressing and bathing while in seated position due to pain and limited AROM of R hip. Pt requires heavy +2 assist for bed mobility at this time. Pt instructed in falls prevention strategies, safe use of during functional transfers, as well as PLB as an energy conservation strategy to maximize energy conservation and activity tolerance during session. Pt would benefit from additional instruction in self care skills, energy conservation strategies, and techniques to help maintain precautions with or without assistive devices to support recall and carryover prior to discharge. Recommend STR upon hospital DC to maximize pt safety and return to PLOF.      Follow Up Recommendations  SNF    Equipment Recommendations  Other (comment)(TBD)    Recommendations for Other Services       Precautions / Restrictions Precautions Precautions: Fall Restrictions Weight Bearing Restrictions: Yes RLE Weight Bearing: Weight bearing as tolerated      Mobility Bed Mobility Overal bed mobility: Needs Assistance Bed Mobility: Supine to Sit;Sit  to Supine     Supine to sit: +2 for physical assistance;Max assist;HOB elevated Sit to supine: +2 for physical assistance;Max assist;HOB elevated   General bed mobility comments: Assist req for mgt of BLEs and trunk support during sup<>sit.  Transfers Overall transfer level: Needs assistance Equipment used: Rolling walker (2 wheeled) Transfers: Sit to/from Stand Sit to Stand: +2 physical assistance;Max assist;Mod assist         General transfer comment: Pt attempted STS x2. Unable to come to full stand on this date.    Balance Overall balance assessment: Needs assistance Sitting-balance support: Single extremity supported Sitting balance-Leahy Scale: Poor Sitting balance - Comments: Pt required Mod A to CGA for sitting balance on this date. Pt noted with L lean when initially coming to sitting (likely to decrease pressure on RLE while seated EOB). Required cueing to maintain upright posture and to minimize posterior lean as support decreased. Postural control: Left lateral lean;Posterior lean Standing balance support: Bilateral upper extremity supported Standing balance-Leahy Scale: Zero Standing balance comment: Pt unable to come to full stand on this date. Required +2 mod/max assist from therapists.                           ADL either performed or assessed with clinical judgement   ADL Overall ADL's : Needs assistance/impaired                                       General ADL Comments: Pt req. +2 mod/max assist for STS transfer  on this date. Unable to come to full stand with support. At this time requires max assist for for LB dressing/bathing 2/2 pain and decreased ROM in the R hip. Fatigues quickly. Is familiar with PLB and ECS, but requires cueing to utilize during functional task. Min/mod assist for UB ADL tasks. Bed level for toileting.     Vision         Perception     Praxis      Pertinent Vitals/Pain Pain Assessment: Faces Faces  Pain Scale: Hurts whole lot Pain Location: RLE with movement. Pain Descriptors / Indicators: Guarding;Grimacing;Sore Pain Intervention(s): Limited activity within patient's tolerance;Monitored during session;Repositioned;Premedicated before session;Utilized relaxation techniques     Hand Dominance Right   Extremity/Trunk Assessment Upper Extremity Assessment Upper Extremity Assessment: Generalized weakness(Pt grossly 3+/5. Able to use UE for functional tasks, but fatigues quickly. Grip WFL.)   Lower Extremity Assessment Lower Extremity Assessment: Defer to PT evaluation;RLE deficits/detail RLE Deficits / Details: s/p R IM nail RLE: Unable to fully assess due to pain RLE Coordination: decreased fine motor;decreased gross motor       Communication Communication Communication: No difficulties   Cognition Arousal/Alertness: Awake/alert Behavior During Therapy: Anxious;WFL for tasks assessed/performed Overall Cognitive Status: Within Functional Limits for tasks assessed                                 General Comments: Pt anxious about movement.   General Comments  Pt with SPo2 of 90 while seated EOB. Increased to 92 with cues for PLB. Pt on 3L O2 t/o session.    Exercises Other Exercises Other Exercises: Pt educated in ECS including PLB for improved activity tolerance during functional mobility as well as bed mobility techniques and safe use of AE for functional transfer on this date.   Shoulder Instructions      Home Living Family/patient expects to be discharged to:: Private residence Living Arrangements: Spouse/significant other Available Help at Discharge: Family;Available 24 hours/day Type of Home: House Home Access: Stairs to enter Entergy CorporationEntrance Stairs-Number of Steps: 5 Entrance Stairs-Rails: Left Home Layout: One level               Home Equipment: Emergency planning/management officerhower seat;Walker - 2 wheels;Cane - single point          Prior Functioning/Environment Level of  Independence: Needs assistance  Gait / Transfers Assistance Needed: Uses RW for HH distances. SPC for community distances. Pt endorses she does not move much 2/2 fatigue/SOB. ADL's / Homemaking Assistance Needed: Family assists with IADL; Dtr visits weekly to support bathing.            OT Problem List: Decreased strength;Decreased coordination;Cardiopulmonary status limiting activity;Pain;Decreased range of motion;Increased edema;Decreased activity tolerance;Decreased safety awareness;Decreased knowledge of use of DME or AE;Impaired balance (sitting and/or standing);Decreased knowledge of precautions      OT Treatment/Interventions: Self-care/ADL training;Modalities;Balance training;Therapeutic exercise;Therapeutic activities;Energy conservation;DME and/or AE instruction;Patient/family education    OT Goals(Current goals can be found in the care plan section) Acute Rehab OT Goals Patient Stated Goal: To have less pain OT Goal Formulation: With patient Time For Goal Achievement: 12/10/2018 Potential to Achieve Goals: Good ADL Goals Pt Will Perform Lower Body Bathing: sitting/lateral leans;with min assist;with adaptive equipment(With LRAD PRN for improved safety and functional mobility.) Pt Will Perform Lower Body Dressing: with adaptive equipment;with min assist;sit to/from stand(With LRAD PRN for improved safety and functional mobility.) Pt Will Transfer to Toilet: stand pivot transfer;with mod  assist;bedside commode  OT Frequency: Min 1X/week   Barriers to D/C: Inaccessible home environment          Co-evaluation PT/OT/SLP Co-Evaluation/Treatment: Yes Reason for Co-Treatment: Complexity of the patient's impairments (multi-system involvement);To address functional/ADL transfers;For patient/therapist safety PT goals addressed during session: Mobility/safety with mobility;Strengthening/ROM;Balance OT goals addressed during session: ADL's and self-care;Proper use of Adaptive equipment  and DME      AM-PAC OT "6 Clicks" Daily Activity     Outcome Measure Help from another person eating meals?: None Help from another person taking care of personal grooming?: A Little Help from another person toileting, which includes using toliet, bedpan, or urinal?: A Lot Help from another person bathing (including washing, rinsing, drying)?: A Lot Help from another person to put on and taking off regular upper body clothing?: A Little Help from another person to put on and taking off regular lower body clothing?: A Lot 6 Click Score: 16   End of Session Equipment Utilized During Treatment: Gait belt;Rolling walker  Activity Tolerance: Patient limited by pain;Patient tolerated treatment well Patient left: in bed;with call bell/phone within reach;with bed alarm set;with SCD's reapplied  OT Visit Diagnosis: Other abnormalities of gait and mobility (R26.89);Pain;Muscle weakness (generalized) (M62.81) Pain - Right/Left: Right Pain - part of body: Hip                Time: 1007-1030 OT Time Calculation (min): 23 min Charges:  OT General Charges $OT Visit: 1 Visit OT Evaluation $OT Eval Moderate Complexity: 1 Mod OT Treatments $Self Care/Home Management : 8-22 mins  Shara Blazing, M.S., OTR/L Ascom: [phone] 11/19/18, 11:35 AM

## 2018-11-19 NOTE — TOC Progression Note (Signed)
Transition of Care [facility]) - Progression Note    Patient Details  Name: Nupur Hohman MRN: [id number] Date of Birth: 1945-09-25  Transition of [facility] CM/SW Contact  Su Hilt, RN Phone Number: 11/19/2018, 12:30 PM  Clinical Narrative:     Spoke with the Daughter Altha Harm she tells me that they would prefer [facility] over any other SNF   explained that [facility] has had 1 positive case of Covid in an employee, she stated that she would still like to have [facility]. I agreed to reach out to see if a bed is available and let her know   Expected Discharge Plan: [place] Barriers to Discharge: Continued Medical Work up  Expected Discharge Plan and Services Expected Discharge Plan: [place]   Discharge Planning Services: CM Consult Post Acute Care Choice: [place] arrangements for the past 2 months: Single Family Home                 DME Arranged: N/A         HH Arranged: PT HH Agency: [place] ([place]) Date HH Agency Contacted: 11/19/18 Time [place]: 1206 Representative spoke with at [place]: [place] ([place]) Interventions    Readmission Risk Interventions No flowsheet data found.

## 2018-11-19 NOTE — Progress Notes (Signed)
PT Cancellation Note  Patient Details Name: Sarah Mckee MRN: [id number] DOB: [birth year]   Cancelled Treatment:    Reason Eval/Treat Not Completed: Pain limiting ability to participate(Consult received and chart reviewed. Patient reporting significant pain meds, would like additional meds prior to therapy start.  RN informed/aware and to administer as available.  Will re-attempt after meds.)   Eulalio Reamy H. Owens Shark, PT, DPT, NCS 11/19/18, 8:58 AM [phone]

## 2018-11-19 NOTE — Evaluation (Signed)
Physical Therapy Evaluation Patient Details Name: Sarah Mckee MRN: [id number] DOB: 05-22-1945 Today's Date: 11/19/2018   History of Present Illness  73 year old female with chronic respiratory failure secondary to COPD on 3 L home oxygen, hypertension, lymphedema, pulmonary hypertension presents to hospital after mechanical fall and right hip pain. Pt s/p R IM nail on 7/2 (WBAT)  Clinical Impression  Upon evaluation, patient alert and oriented; follows commands and demonstrates fair effort with mobility tasks (with increased time/encouragement throughout session).  Bilat LEs globally weak and deconditioned, strength grossly 2+ to 3-/5 throughout.  Very guarded and hesitant for movement; generally painful (8/10) despite pre-medication.  Requires heavy act assist from therapist for movement in all planes and +2 assist for any/all mobility tasks.  Currently requiring max assist +2 for bed mobility; min/mod assist for unsupported sitting balance; mod assist +2 for partial sit/stand with RW from elevated bed surface.  Spontaneously sits with each standing attempt; unsafe/unable to attempt gait at this time.  Will continue to assess/progress as able. Would benefit from skilled PT to address above deficits and promote optimal return to PLOF; recommend transition to STR upon discharge from acute hospitalization.     Follow Up Recommendations SNF    Equipment Recommendations       Recommendations for Other Services       Precautions / Restrictions Precautions Precautions: Fall Restrictions Weight Bearing Restrictions: Yes RLE Weight Bearing: Weight bearing as tolerated      Mobility  Bed Mobility Overal bed mobility: Needs Assistance Bed Mobility: Supine to Sit     Supine to sit: +2 for physical assistance;Max assist Sit to supine: Max assist;+2 for physical assistance   General bed mobility comments: total assist for LE management and trunk control/position  Transfers Overall  transfer level: Needs assistance Equipment used: Rolling walker (2 wheeled) Transfers: Sit to/from Stand Sit to Stand: Mod assist;+2 physical assistance         General transfer comment: requires elevated bed surface and +2 assist to complete. Unweights buttocks from bed surface, but does not achieve full stance.  Spontaneously sits after 2-3 seconds upright; very fearful of mvoement and pain.  Ambulation/Gait             General Gait Details: unsafe/unable  Stairs            Wheelchair Mobility    Modified Rankin (Stroke Patients Only)       Balance Overall balance assessment: Needs assistance Sitting-balance support: No upper extremity supported;Feet supported Sitting balance-Leahy Scale: Poor Sitting balance - Comments: Pt required Mod A to CGA for sitting balance on this date. Pt noted with L lean when initially coming to sitting (likely to decrease pressure on RLE while seated EOB). Required cueing to maintain upright posture and to minimize posterior lean as support decreased. Postural control: Left lateral lean;Posterior lean Standing balance support: Bilateral upper extremity supported Standing balance-Leahy Scale: Zero Standing balance comment: Pt unable to come to full stand on this date. Required +2 mod/max assist from therapists.                             Pertinent Vitals/Pain Pain Assessment: 0-10 Faces Pain Scale: Hurts whole lot Pain Location: RLE with movement. Pain Descriptors / Indicators: Guarding;Grimacing;Sore Pain Intervention(s): Limited activity within patient's tolerance;Monitored during session;Premedicated before session;Repositioned    Home Living Family/patient expects to be discharged to:: Private residence Living Arrangements: Spouse/significant other Available Help at Discharge: Family;Available 24 hours/day  Type of Home: House Home Access: Stairs to enter Entrance Stairs-Rails: Left Entrance Stairs-Number of  Steps: 5 Home Layout: One level Home Equipment: Emergency planning/management officerhower seat;Walker - 2 wheels;Cane - single point      Prior Function Level of Independence: Needs assistance   Gait / Transfers Assistance Needed: Uses RW for HH distances. SPC for community distances. Pt endorses she does not move much 2/2 fatigue/SOB.  Baseline O2 at 3L  ADL's / Homemaking Assistance Needed: Family assists with IADL; Dtr visits weekly to support bathing.  Comments: Does endorse at least 1-2 falls in previous six months     Hand Dominance   Dominant Hand: Right    Extremity/Trunk Assessment   Upper Extremity Assessment Upper Extremity Assessment: Overall WFL for tasks assessed    Lower Extremity Assessment Lower Extremity Assessment: Generalized weakness(generally 2+ to 3-/5 throughout bilat LEs, limited attempts at active movement.  Bilat ankle DF to neutral only (requiring mild overpressure to acheive)) RLE Deficits / Details: s/p R IM nail RLE: Unable to fully assess due to pain RLE Coordination: decreased fine motor;decreased gross motor       Communication   Communication: No difficulties  Cognition Arousal/Alertness: Awake/alert Behavior During Therapy: Flat affect;Anxious Overall Cognitive Status: Within Functional Limits for tasks assessed                                 General Comments: Pt anxious about movement.      General Comments General comments (skin integrity, edema, etc.): Pt with SPo2 of 90 while seated EOB. Increased to 92 with cues for PLB. Pt on 3L O2 t/o session.    Exercises Other Exercises Other Exercises: Supine R LE therex, 1x10, act assist ROM: ankle pumps, quad sets, SAQs, heel slides, hip abduct/adduct. Very limited active movement/effort with supine therex; very fearful/guarded due to pain. Other Exercises: Sit/stand x2 with RW, mod/max assist +2 from elevated bed surface. Unable to maintain >1-2 sceonds each trial; spontaneously sititng despite  encouragement.   Assessment/Plan    PT Assessment Patient needs continued PT services  PT Problem List Decreased range of motion;Decreased activity tolerance;Decreased balance;Decreased mobility;Decreased strength;Decreased coordination;Decreased cognition;Decreased knowledge of use of DME;Decreased safety awareness;Cardiopulmonary status limiting activity;Decreased knowledge of precautions;Decreased skin integrity;Pain       PT Treatment Interventions DME instruction;Balance training;Gait training;Stair training;Functional mobility training;Therapeutic activities;Therapeutic exercise;Cognitive remediation;Patient/family education    PT Goals (Current goals can be found in the Care Plan section)  Acute Rehab PT Goals Patient Stated Goal: To have less pain PT Goal Formulation: With patient Time For Goal Achievement: 12/12/2018 Potential to Achieve Goals: Fair    Frequency 7X/week   Barriers to discharge Decreased caregiver support      Co-evaluation PT/OT/SLP Co-Evaluation/Treatment: Yes Reason for Co-Treatment: Complexity of the patient's impairments (multi-system involvement);To address functional/ADL transfers PT goals addressed during session: Mobility/safety with mobility OT goals addressed during session: ADL's and self-care       AM-PAC PT "6 Clicks" Mobility  Outcome Measure Help needed turning from your back to your side while in a flat bed without using bedrails?: A Lot Help needed moving from lying on your back to sitting on the side of a flat bed without using bedrails?: A Lot Help needed moving to and from a bed to a chair (including a wheelchair)?: A Lot Help needed standing up from a chair using your arms (e.g., wheelchair or bedside chair)?: A Lot Help needed to walk  in hospital room?: Total Help needed climbing 3-5 steps with a railing? : Total 6 Click Score: 10    End of Session Equipment Utilized During Treatment: Gait belt Activity Tolerance: Patient  tolerated treatment well Patient left: in bed;with bed alarm set;with call bell/phone within reach Nurse Communication: Mobility status PT Visit Diagnosis: Muscle weakness (generalized) (M62.81);Difficulty in walking, not elsewhere classified (R26.2);Pain Pain - Right/Left: Right Pain - part of body: Hip    Time: 0034-9179 PT Time Calculation (min) (ACUTE ONLY): 43 min   Charges:   PT Evaluation $PT Eval Moderate Complexity: 1 Mod PT Treatments $Therapeutic Exercise: 8-22 mins        Caris Cerveny H. Owens Shark, PT, DPT, NCS 11/19/18, 12:15 PM [phone]

## 2018-11-19 NOTE — Progress Notes (Addendum)
[facility] - [place] [facility]   PATIENT NAME: Sarah Mckee    MR#:  [id number]  DATE OF BIRTH:  07/30/1945  SUBJECTIVE:  CHIEF COMPLAINT:   Chief Complaint  Patient presents with  . Fall  . Hip Pain   -Right hip surgery done yesterday.  Patient in significant amount of pain today.  Also complains of right knee pain -COPD on chronic home oxygen  REVIEW OF SYSTEMS:  Review of Systems  Constitutional: Positive for malaise/fatigue. Negative for chills and fever.  HENT: Negative for congestion, ear discharge, hearing loss and nosebleeds.   Eyes: Negative for blurred vision and double vision.  Respiratory: Positive for shortness of breath. Negative for cough and wheezing.   Cardiovascular: Negative for chest pain and palpitations.  Gastrointestinal: Negative for abdominal pain, constipation, diarrhea, nausea and vomiting.  Genitourinary: Negative for dysuria.  Musculoskeletal: Positive for joint pain and myalgias.  Neurological: Positive for weakness. Negative for dizziness, speech change, focal weakness, seizures and headaches.  Psychiatric/Behavioral: Negative for depression.    DRUG ALLERGIES:   Allergies  Allergen Reactions  . Codeine Shortness Of Breath  . Demerol [Meperidine] Shortness Of Breath  . Morphine And Related Shortness Of Breath  . Oxycodone Shortness Of Breath  . Alendronate Other (See Comments)    Other reaction(s): Other (See Comments) esophagitis  . Amoxicillin-Pot Clavulanate Other (See Comments)    Unknown Has patient had a PCN reaction causing immediate rash, facial/tongue/throat swelling, SOB or lightheadedness with hypotension: Unknown Has patient had a PCN reaction causing severe rash involving mucus membranes or skin necrosis: Unknown Has patient had a PCN reaction that required hospitalization: Unknown Has patient had a PCN reaction occurring within the last 10 years: Unknown If all of the above answers are "NO", then may  proceed with Cephalosporin use.   Marland Kitchen. Risedronate Other (See Comments)    Other reaction(s): Other (See Comments) epigastric pain  . Shellfish Allergy Nausea And Vomiting    VITALS:  Blood pressure 118/60, pulse (!) 105, temperature 98.2 F (36.8 C), resp. rate 18, height 5\' 2"  (1.575 m), weight 87.1 kg, SpO2 97 %.  PHYSICAL EXAMINATION:  Physical Exam   GENERAL:  73 y.o.-year-old patient lying in the bed with no acute distress.  EYES: Pupils equal, round, reactive to light and accommodation. No scleral icterus. Extraocular muscles intact.  HEENT: Head atraumatic, normocephalic. Oropharynx and nasopharynx clear.  NECK:  Supple, no jugular venous distention. No thyroid enlargement, no tenderness.  LUNGS: Normal breath sounds bilaterally, no wheezing, rales,rhonchi or crepitation.  Decreased bibasilar breath sounds.  No use of accessory muscles of respiration.  CARDIOVASCULAR: S1, S2 normal. No  rubs, or gallops.  2/6 systolic murmur is present ABDOMEN: Soft, nontender, nondistended. Bowel sounds present. No organomegaly or mass.  EXTREMITIES: 2+ bilateral pedal edema noted.  No  cyanosis, or clubbing.  Right leg is swollen, dressing in place on the right lateral thigh NEUROLOGIC: Cranial nerves II through XII are intact. Muscle strength 5/5 in all extremities except right lower extremity due to pain. Sensation intact. Gait not checked.  PSYCHIATRIC: The patient is alert and oriented, slightly groggy from pain medication.  SKIN: No obvious rash, lesion, or ulcer.    LABORATORY PANEL:   CBC Recent Labs  Lab 11/19/18 0438  WBC 9.3  HGB 9.5*  HCT 31.2*  PLT 106*   ------------------------------------------------------------------------------------------------------------------  Chemistries  Recent Labs  Lab 11/17/18 2234  11/19/18 0438  NA 141   < > 141  K 3.9   < > 4.5  CL 95*   < > 99  CO2 39*   < > 34*  GLUCOSE 124*   < > 162*  BUN 16   < > 30*  CREATININE 0.58   < >  0.90  CALCIUM 9.0   < > 8.1*  AST 28  --   --   ALT 36  --   --   ALKPHOS 83  --   --   BILITOT 0.7  --   --    < > = values in this interval not displayed.   ------------------------------------------------------------------------------------------------------------------  Cardiac Enzymes No results for input(s): TROPONINI in the last 168 hours. ------------------------------------------------------------------------------------------------------------------  RADIOLOGY:  Dg Chest Port 1 View  Result Date: 11/17/2018 CLINICAL DATA:  73 year old female status post fall.  Hip fracture. EXAM: PORTABLE CHEST 1 VIEW COMPARISON:  Chest radiographs 06/27/2017 and earlier. FINDINGS: AP supine view at 2322 hours. Chronic large lung volumes with emphysema demonstrated on 2018 chest CT. No pneumothorax, pulmonary edema, pleural effusion or acute pulmonary opacity. Normal cardiac size and mediastinal contours. Visualized tracheal air column is within normal limits. Negative visible bowel gas pattern. IMPRESSION: Emphysema (ICD10-J43.9). No acute cardiopulmonary abnormality. Electronically Signed   By: Genevie Ann M.D.   On: 11/17/2018 23:40   Dg Hip Operative Unilat W Or W/o Pelvis Right  Result Date: 11/18/2018 CLINICAL DATA:  ORIF right hip. EXAM: OPERATIVE right HIP (WITH PELVIS IF PERFORMED) 4 VIEWS TECHNIQUE: Fluoroscopic spot image(s) were submitted for interpretation post-operatively. COMPARISON:  11/17/2018 FINDINGS: 4 images show gamma nail fixation of an intertrochanteric fracture of the right femur. Good position and alignment. Distal locking screw in place. IMPRESSION: Gamma nail fixation of intertrochanteric fracture. Electronically Signed   By: Nelson Chimes M.D.   On: 11/18/2018 16:01   Dg Hip Unilat W Or Wo Pelvis 2-3 Views Right  Result Date: 11/17/2018 CLINICAL DATA:  73 year old female status post fall. Hip pain. EXAM: DG HIP (WITH OR WITHOUT PELVIS) 2-3V RIGHT COMPARISON:  CT Abdomen and  Pelvis 04/01/2016. FINDINGS: Chronic bilateral femoral head AVN as seen on the comparison CT. There is a comminuted acute intertrochanteric fracture of the right femur with varus impaction. The right femoral head remains normally located. The proximal left femur appears grossly intact. Chronic right inferior pubic ramus fracture. No acute pelvis fracture identified. Negative visible bowel gas pattern. IMPRESSION: 1. Comminuted acute intertrochanteric fracture of the right femur with varus impaction. 2. Chronic bilateral femoral head AVN. Chronic right inferior pubic ramus fracture. Electronically Signed   By: Genevie Ann M.D.   On: 11/17/2018 23:42    EKG:   Orders placed or performed during the hospital encounter of 11/17/18  . ED EKG  . ED EKG  . EKG 12-Lead  . EKG 12-Lead    ASSESSMENT AND PLAN:   73 year old female with chronic respiratory failure secondary to COPD on 3 L home oxygen, hypertension, lymphedema, pulmonary hypertension presents to hospital after mechanical fall and right hip pain.  1.  Right hip intertrochanteric fracture-secondary to mechanical fall -Appreciate orthopedics consult.  Status post ORIF, postop day 1 today - patient has history of chronic bilateral femoral head avascular necrosis. -Continue pain control.  To work with physical therapy today  2.  Chronic respiratory failure-secondary to COPD.  Monitor closely in the postop period.  On 3 L home oxygen all the time -Continue inhalers, nebulizers.  No acute exacerbation identified -Lasix PRN  3.  Hypertension-on Maxzide  4.  Depression-Zoloft  5.  DVT prophylaxis-started on Lovenox  Might need rehab at discharge     All the records are reviewed and case discussed with Care Management/Social Workerr. Management plans discussed with the patient, family and they are in agreement.  CODE STATUS: Full code  TOTAL TIME TAKING CARE OF THIS PATIENT: 36 minutes.   POSSIBLE D/C IN 2-3 DAYS, DEPENDING ON  CLINICAL CONDITION.   Enid Baasadhika Kassia Demarinis M.D on 11/19/2018 at 10:23 AM  Between 7am to 6pm - Pager - [phone]  After 6pm go to [email or web address] - Social research officer, governmentpassword EPAS [facility]  [facility]  Office  [phone]  CC: Primary care physician; Patrice ParadiseMcLaughlin, Miriam K, MD

## 2018-11-19 NOTE — Progress Notes (Signed)
  Subjective: 1 Day Post-Op Procedure(s) (LRB): INTRAMEDULLARY (IM) NAIL INTERTROCHANTRIC (Right) Patient reports pain as moderate.   Patient is well, receiving breathing treatment when I walk into the room this AM PT and care management to assist with discharge planning. Negative for chest pain and shortness of breath Fever: no Gastrointestinal:  Does report some nausea this AM.  Objective: Vital signs in last 24 hours: Temp:  [97.4 F (36.3 C)-98.9 F (37.2 C)] 98.6 F (37 C) (07/03 0456) Pulse Rate:  [83-108] 104 (07/03 0456) Resp:  [8-18] 18 (07/03 0456) BP: (78-130)/(36-77) 105/49 (07/03 0456) SpO2:  [94 %-100 %] 98 % (07/03 0700)  Intake/Output from previous day:  Intake/Output Summary (Last 24 hours) at 11/19/2018 0832 Last data filed at 11/19/2018 0500 Gross per 24 hour  Intake 3484.42 ml  Output 770 ml  Net 2714.42 ml    Intake/Output this shift: No intake/output data recorded.  Labs: Recent Labs    11/17/18 2234 11/18/18 0528 11/19/18 0438  HGB 12.4 11.9* 9.5*   Recent Labs    11/18/18 0528 11/19/18 0438  WBC 16.0* 9.3  RBC 3.87 3.08*  HCT 39.3 31.2*  PLT 149* 106*   Recent Labs    11/18/18 0528 11/19/18 0438  NA 141 141  K 4.0 4.5  CL 95* 99  CO2 40* 34*  BUN 16 30*  CREATININE 0.53 0.90  GLUCOSE 154* 162*  CALCIUM 8.5* 8.1*   Recent Labs    11/17/18 2234  INR 0.9     EXAM General - Patient is Alert, Appropriate and Oriented Extremity - ABD soft Sensation intact distally Intact pulses distally Dorsiflexion/Plantar flexion intact Incision: dressing C/D/I No cellulitis present Dressing/Incision - clean, dry, no drainage Motor Function - intact, moving foot and toes well on exam.   Past Medical History:  Diagnosis Date  . Arthritis   . COPD (chronic obstructive pulmonary disease) (HCC)    2L 02 at baseline  . Hypertension   . Lymphedema of left leg   . Lymphedema of right lower extremity   . Pulmonary HTN (HCC)      Assessment/Plan: 1 Day Post-Op Procedure(s) (LRB): INTRAMEDULLARY (IM) NAIL INTERTROCHANTRIC (Right) Active Problems:   Closed right hip fracture (HCC)  Estimated body mass index is 35.12 kg/m as calculated from the following:   Height as of this encounter: 5\' 2"  (1.575 m).   Weight as of this encounter: 87.1 kg. Advance diet Up with therapy D/C IV fluids when tolerating po intake.  Labs reviewed.  Hg 9.5 this morning. Continue with dilaudid for pain, allergic to other medications. Up with therapy today.   Begin working on BM.  DVT Prophylaxis - Lovenox, Foot Pumps and TED hose Weight-Bearing as tolerated to right leg  J. Cameron Proud, PA-C [facility] 11/19/2018, 8:32 AM

## 2018-11-20 ENCOUNTER — Inpatient Hospital Stay: Payer: PPO

## 2018-11-20 LAB — IRON AND TIBC
Iron: 22 ug/dL — ABNORMAL LOW (ref 28–170)
Saturation Ratios: 8 % — ABNORMAL LOW (ref 10.4–31.8)
TIBC: 274 ug/dL (ref 250–450)
UIBC: 252 ug/dL

## 2018-11-20 LAB — FERRITIN: Ferritin: 132 ng/mL (ref 11–307)

## 2018-11-20 LAB — VITAMIN B12: Vitamin B-12: 244 pg/mL (ref 180–914)

## 2018-11-20 LAB — FOLATE: Folate: 12.6 ng/mL (ref >5.9)

## 2018-11-20 NOTE — Progress Notes (Signed)
  Subjective: 2 Days Post-Op Procedure(s) (LRB): INTRAMEDULLARY (IM) NAIL INTERTROCHANTRIC (Right) Patient reports pain as moderate, she is taking dilaudid for the pain. Patient is well this morning, just taken pain medication. Plan for discharge to SNF when appropriate. Reports chest tightness but this is chronic for her. Fever: no Gastrointestinal:  Does report some nausea this AM.  Objective: Vital signs in last 24 hours: Temp:  [97.9 F (36.6 C)-98 F (36.7 C)] 98 F (36.7 C) (07/04 0739) Pulse Rate:  [102-111] 111 (07/04 0739) Resp:  [16-18] 16 (07/04 0739) BP: (121-130)/(49-70) 124/62 (07/04 0739) SpO2:  [95 %-100 %] 99 % (07/04 0739)  Intake/Output from previous day:  Intake/Output Summary (Last 24 hours) at 11/20/2018 0850 Last data filed at 11/20/2018 0542 Gross per 24 hour  Intake 1756.33 ml  Output 500 ml  Net 1256.33 ml    Intake/Output this shift: No intake/output data recorded.  Labs: Recent Labs    11/17/18 2234 11/18/18 0528 11/19/18 0438  HGB 12.4 11.9* 9.5*   Recent Labs    11/18/18 0528 11/19/18 0438  WBC 16.0* 9.3  RBC 3.87 3.08*  HCT 39.3 31.2*  PLT 149* 106*   Recent Labs    11/18/18 0528 11/19/18 0438  NA 141 141  K 4.0 4.5  CL 95* 99  CO2 40* 34*  BUN 16 30*  CREATININE 0.53 0.90  GLUCOSE 154* 162*  CALCIUM 8.5* 8.1*   Recent Labs    11/17/18 2234  INR 0.9   EXAM General - Patient is Alert, Appropriate and Oriented Extremity - ABD soft Sensation intact distally Intact pulses distally Dorsiflexion/Plantar flexion intact Incision: dressing C/D/I No cellulitis present Dressing/Incision - clean, dry, no drainage Motor Function - intact, moving foot and toes well on exam.   Past Medical History:  Diagnosis Date  . Arthritis   . COPD (chronic obstructive pulmonary disease) (HCC)    2L 02 at baseline  . Hypertension   . Lymphedema of left leg   . Lymphedema of right lower extremity   . Pulmonary HTN (HCC)      Assessment/Plan: 2 Days Post-Op Procedure(s) (LRB): INTRAMEDULLARY (IM) NAIL INTERTROCHANTRIC (Right) Active Problems:   Closed right hip fracture (HCC)  Estimated body mass index is 35.12 kg/m as calculated from the following:   Height as of this encounter: 5\' 2"  (1.575 m).   Weight as of this encounter: 87.1 kg. Advance diet Up with therapy   Continue with dilaudid for pain, allergic to other medications. Up with therapy today.  Current plan is for discharge to SNF. Begin working on BM.  Upon discharge from hospital, continue Lovenox 40mg  daily for DVT prevention. Staples can be removed on 12/07/2018 by rehab facility.  Follow-up with [facility] in 6 weeks for repeat x-rays of the right femur.  DVT Prophylaxis - Lovenox, Foot Pumps and TED hose Weight-Bearing as tolerated to right leg  J. Cameron Proud, PA-C [facility] 11/20/2018, 8:50 AM

## 2018-11-20 NOTE — Progress Notes (Addendum)
[employer] - [place] at [facility]   PATIENT NAME: Shann MedalJanet Plaisted    MR#:  [id number]  DATE OF BIRTH:  10-16-1945  SUBJECTIVE:  CHIEF COMPLAINT:   Chief Complaint  Patient presents with  . Fall  . Hip Pain   Patient is seen at the bedside. Still with moderate amount of pain. S/p right hip surgery POD # 2. Also complains of worsening back pain since her fall.  COPD on chronic home oxygen  REVIEW OF SYSTEMS:  Review of Systems  Constitutional: Positive for malaise/fatigue. Negative for chills and fever.  HENT: Negative for congestion, ear discharge, hearing loss and nosebleeds.   Eyes: Negative for blurred vision and double vision.  Respiratory: Positive for shortness of breath and wheezing. Negative for cough.   Cardiovascular: Negative for chest pain and palpitations.  Gastrointestinal: Negative for abdominal pain, constipation, diarrhea, nausea and vomiting.  Genitourinary: Negative for dysuria.  Musculoskeletal: Positive for back pain, joint pain and myalgias.  Neurological: Positive for weakness. Negative for dizziness, speech change, focal weakness, seizures and headaches.  Psychiatric/Behavioral: Negative for depression.   DRUG ALLERGIES:   Allergies  Allergen Reactions  . Codeine Shortness Of Breath  . Demerol [Meperidine] Shortness Of Breath  . Morphine And Related Shortness Of Breath  . Oxycodone Shortness Of Breath  . Alendronate Other (See Comments)    Other reaction(s): Other (See Comments) esophagitis  . Amoxicillin-Pot Clavulanate Other (See Comments)    Unknown Has patient had a PCN reaction causing immediate rash, facial/tongue/throat swelling, SOB or lightheadedness with hypotension: Unknown Has patient had a PCN reaction causing severe rash involving mucus membranes or skin necrosis: Unknown Has patient had a PCN reaction that required hospitalization: Unknown Has patient had a PCN reaction occurring within the last 10 years: Unknown  If all of the above answers are "NO", then may proceed with Cephalosporin use.   Marland Kitchen. Risedronate Other (See Comments)    Other reaction(s): Other (See Comments) epigastric pain  . Shellfish Allergy Nausea And Vomiting    VITALS:  Blood pressure 124/62, pulse (!) 111, temperature 98 F (36.7 C), temperature source Oral, resp. rate 16, height 5\' 2"  (1.575 m), weight 87.1 kg, SpO2 99 %.  PHYSICAL EXAMINATION:  Physical Exam   GENERAL:  73 y.o.-year-old patient lying in the bed with no acute distress.  EYES: Pupils equal, round, reactive to light and accommodation. No scleral icterus. Extraocular muscles intact.  HEENT: Head atraumatic, normocephalic. Oropharynx and nasopharynx clear.  NECK:  Supple, no jugular venous distention. No thyroid enlargement, no tenderness.  LUNGS: Normal breath sounds bilaterally, mild wheezing no rales,rhonchi or crepitation.  Decreased bibasilar breath sounds.  No use of accessory muscles of respiration.  CARDIOVASCULAR: S1, S2 normal. No  rubs, or gallops.  2/6 systolic murmur is present ABDOMEN: Soft, nontender, nondistended. Bowel sounds present. No organomegaly or mass.  EXTREMITIES: 2+ bilateral pedal edema noted.  No  cyanosis, or clubbing.  Right leg is swollen, dressing in place on the right lateral thigh NEUROLOGIC: Cranial nerves II through XII are intact. Muscle strength 5/5 in all extremities except right lower extremity due to pain. Sensation intact. Gait not checked.  PSYCHIATRIC: The patient is alert and oriented, slightly groggy from pain medication.  SKIN: No obvious rash, lesion, or ulcer.    LABORATORY PANEL:   CBC Recent Labs  Lab 11/19/18 0438  WBC 9.3  HGB 9.5*  HCT 31.2*  PLT 106*   ------------------------------------------------------------------------------------------------------------------  Chemistries  Recent Labs  Lab  11/17/18 2234  11/19/18 0438  NA 141   < > 141  K 3.9   < > 4.5  CL 95*   < > 99  CO2 39*   < >  34*  GLUCOSE 124*   < > 162*  BUN 16   < > 30*  CREATININE 0.58   < > 0.90  CALCIUM 9.0   < > 8.1*  AST 28  --   --   ALT 36  --   --   ALKPHOS 83  --   --   BILITOT 0.7  --   --    < > = values in this interval not displayed.   ------------------------------------------------------------------------------------------------------------------  Cardiac Enzymes No results for input(s): TROPONINI in the last 168 hours. ------------------------------------------------------------------------------------------------------------------  RADIOLOGY:  Dg Hip Operative Unilat W Or W/o Pelvis Right  Result Date: 11/18/2018 CLINICAL DATA:  ORIF right hip. EXAM: OPERATIVE right HIP (WITH PELVIS IF PERFORMED) 4 VIEWS TECHNIQUE: Fluoroscopic spot image(s) were submitted for interpretation post-operatively. COMPARISON:  11/17/2018 FINDINGS: 4 images show gamma nail fixation of an intertrochanteric fracture of the right femur. Good position and alignment. Distal locking screw in place. IMPRESSION: Gamma nail fixation of intertrochanteric fracture. Electronically Signed   By: Nelson Chimes M.D.   On: 11/18/2018 16:01    EKG:   Orders placed or performed during the hospital encounter of 11/17/18  . ED EKG  . ED EKG  . EKG 12-Lead  . EKG 12-Lead   ASSESSMENT AND PLAN:   73 year old female with chronic respiratory failure secondary to COPD on 3 L home oxygen, hypertension, lymphedema, compression fx of lower back, pulmonary hypertension and chronic bilateral femoral head avascular necrosis  presents to hospital after mechanical fall and right hip pain.  1.  Right hip intertrochanteric fracture-secondary to mechanical fall - Status post ORIF, POD # 2 - Continue pain control with Dilaudid - Continue PT, current plan is discharge to SNF - Will need [facility] orthopaedic follow up in 6 weeks for repeat x-rays of the right femur - Ortho input appreciated  2.  Chronic respiratory failure-secondary to COPD.  On 3  L home oxygen all the time - No acute exacerbation identified - Continue inhalers, nebulizers.  - Lasix PRN  3. Chronic compression fx of vertebral - worsening pain since fall. Hx of osteoporosis s/p Fosamax and Actonel - Will obtain Lumbar verterbral x-ray - Continue pain management as above  4. Hypertension-on Maxzide  5. Depression-Zoloft  6. DVT prophylaxis- Continue Lovenox  7. Thombocytopenia - unknown etiology, no signs of bleeding or infection - Continue to monitor  8. Macrocytic anemia - Iron panel, Ferritin - Check B12, and Folate    All the records are reviewed and case discussed with Care Management/Social Workerr. Management plans discussed with the patient, family and they are in agreement.  CODE STATUS: Full code  TOTAL TIME TAKING CARE OF THIS PATIENT: 36 minutes.   POSSIBLE D/C IN 2-3 DAYS, DEPENDING ON CLINICAL CONDITION.   Karen Kays  DNP on 11/20/2018 at 9:12 AM  Between 7am to 6pm - Pager - [phone]  After 6pm go to [email or web address] - password [email or web address] [facility]  Office  [phone]  CC: Primary care physician; Marinda Elk, MD

## 2018-11-20 NOTE — Progress Notes (Signed)
PT Cancellation Note  Patient Details Name: Sarah Mckee MRN: [id number] DOB: Apr 13, 1946   Cancelled Treatment:    Reason Eval/Treat Not Completed: Pain limiting ability to participate   Pt in bed, generally uncomfortable reporting back pain.  Stated she recently returned from x-ray.  Pt stated she thinks she has a new compression fracture in her back from her fall.  X-rays reviewed.  Per report possible MRI for further study.  Pt is unable to participate at this time due to pain.  Will defer therapy at this time as further imaging may be needed per MD guidance. Will follow as appropriate.    Chesley Noon 11/20/2018, 2:08 PM

## 2018-11-21 ENCOUNTER — Encounter: Payer: Self-pay | Admitting: Orthopedic Surgery

## 2018-11-21 LAB — TYPE AND SCREEN
ABO/RH(D): A NEG
Antibody Screen: POSITIVE
Unit division: 0
Unit division: 0

## 2018-11-21 LAB — BPAM RBC
Blood Product Expiration Date: 202007292359
Blood Product Expiration Date: 202007292359
Unit Type and Rh: 600
Unit Type and Rh: 600

## 2018-11-21 MED ORDER — METHOCARBAMOL 750 MG PO TABS
750.0000 mg | ORAL_TABLET | Freq: Three times a day (TID) | ORAL | Status: DC
  Administered 2018-11-21 – 2018-11-26 (×14): 750 mg via ORAL
  Filled 2018-11-21 (×17): qty 1

## 2018-11-21 MED ORDER — LIDOCAINE 5 % EX PTCH
1.0000 | MEDICATED_PATCH | CUTANEOUS | Status: DC
  Administered 2018-11-26: 1 via TRANSDERMAL
  Filled 2018-11-21 (×6): qty 1

## 2018-11-21 MED ORDER — FERROUS SULFATE 325 (65 FE) MG PO TABS
325.0000 mg | ORAL_TABLET | Freq: Two times a day (BID) | ORAL | Status: DC
  Administered 2018-11-21 – 2018-11-26 (×10): 325 mg via ORAL
  Filled 2018-11-21 (×10): qty 1

## 2018-11-21 NOTE — Progress Notes (Signed)
Subjective: 3 Days Post-Op Procedure(s) (LRB): INTRAMEDULLARY (IM) NAIL INTERTROCHANTRIC (Right) Patient reports pain as moderate, she is taking dilaudid for the pain. Patient is reporting increased low back pain since her fall.  X-ray showed possible indeterminate aged L3 compression fracture. Patient is well this morning, just taken pain medication. Plan for discharge to SNF when appropriate. Reports chest tightness but this is chronic for her. Fever: no Gastrointestinal:  Does report some nausea this AM.  Objective: Vital signs in last 24 hours: Temp:  [97.8 F (36.6 C)-98.5 F (36.9 C)] 98 F (36.7 C) (07/05 0830) Pulse Rate:  [67-108] 67 (07/05 0830) Resp:  [17-19] 19 (07/05 0026) BP: (122-138)/(60-77) 128/66 (07/05 0830) SpO2:  [98 %-100 %] 98 % (07/05 0830)  Intake/Output from previous day:  Intake/Output Summary (Last 24 hours) at 11/21/2018 0854 Last data filed at 11/21/2018 0500 Gross per 24 hour  Intake 240 ml  Output 1100 ml  Net -860 ml    Intake/Output this shift: No intake/output data recorded.  Labs: Recent Labs    11/19/18 0438  HGB 9.5*   Recent Labs    11/19/18 0438  WBC 9.3  RBC 3.08*  HCT 31.2*  PLT 106*   Recent Labs    11/19/18 0438  NA 141  K 4.5  CL 99  CO2 34*  BUN 30*  CREATININE 0.90  GLUCOSE 162*  CALCIUM 8.1*   No results for input(s): LABPT, INR in the last 72 hours. EXAM General - Patient is Alert, Appropriate and Oriented  Extremity - ABD soft Sensation intact distally Intact pulses distally Dorsiflexion/Plantar flexion intact Incision: dressing C/D/I No cellulitis present Dressing/Incision - clean, dry, no drainage Motor Function - intact, moving foot and toes well on exam.   Past Medical History:  Diagnosis Date  . Arthritis   . COPD (chronic obstructive pulmonary disease) (HCC)    2L 02 at baseline  . Hypertension   . Lymphedema of left leg   . Lymphedema of right lower extremity   . Pulmonary HTN (HCC)      Assessment/Plan: 3 Days Post-Op Procedure(s) (LRB): INTRAMEDULLARY (IM) NAIL INTERTROCHANTRIC (Right) Active Problems:   Closed right hip fracture (HCC)  Estimated body mass index is 35.12 kg/m as calculated from the following:   Height as of this encounter: 5\' 2"  (1.575 m).   Weight as of this encounter: 87.1 kg. Advance diet Up with therapy   Continue with dilaudid for pain, allergic to other medications. X-rays don't demonstrate any acute compression fracture, patient has chronic low back pain. If pain worsens or continues will obtain MRI for further evaluation. Up with therapy today.  Current plan is for discharge to SNF. Begin working on BM.  Upon discharge from hospital, continue Lovenox 40mg  daily for DVT prevention. Staples can be removed on 11/27/2018 by rehab facility.  Follow-up with [place] in 6 weeks for repeat x-rays of the right femur.  DVT Prophylaxis - Lovenox, Foot Pumps and TED hose Weight-Bearing as tolerated to right leg  J. Cameron Proud, PA-C [facility] Orthopaedic Surgery 11/21/2018, 8:54 AM

## 2018-11-21 NOTE — Progress Notes (Signed)
Patient would like nystantin, states she is getting thrush in her mouth.

## 2018-11-21 NOTE — Progress Notes (Signed)
Physical Therapy Treatment Patient Details Name: Sarah Mckee MRN: [id number] DOB: Mar 05, 1946 Today's Date: 11/21/2018    History of Present Illness 73 year old female with chronic respiratory failure secondary to COPD on 3 L home oxygen, hypertension, lymphedema, pulmonary hypertension presents to hospital after mechanical fall and right hip pain. Pt s/p R IM nail on 7/2 ([facility]AT)    PT Comments    PA on unit upon arrival.  Discussed and ok for continued mobility today.  Pt in bed and appears to be feeling better.  Generally comfortable at rest.  Participated in exercises as described below in supine.  To edge of bed with max a x 1 but pt attempts to help as she is able.  Little trunk rotation to reach for handrails.  Once sitting she is able to sit with min assist and min verbal cues as she will lean backwards at times with fatigue and seated tx.  Pt SOB in sitting.  Sats 86-87% on 3 lpm.  States she uses 3 lpm at baseline at home.  Standing deferred at this time but she was able to sit EOB approx 10 minutes before SOB developed.  Returned to supine with max a/dependant +1.  Sats increased to 92% after short supine rest. She did report continued back pain in sitting 9/10.    Pain, SOB and general weakness limited progression of mobility today.   Follow Up Recommendations  SNF     Equipment Recommendations       Recommendations for Other Services       Precautions / Restrictions Precautions Precautions: Fall Restrictions Weight Bearing Restrictions: Yes RLE Weight Bearing: Weight bearing as tolerated    Mobility  Bed Mobility Overal bed mobility: Needs Assistance Bed Mobility: Sit to Supine     Supine to sit: Mod assist;Max assist Sit to supine: Max assist;Total assist      Transfers                 General transfer comment: deferred this session  Ambulation/Gait             General Gait Details: unsafe/unable   Stairs             Wheelchair  Mobility    Modified Rankin (Stroke Patients Only)       Balance Overall balance assessment: Needs assistance Sitting-balance support: No upper extremity supported;Feet supported Sitting balance-Leahy Scale: Poor Sitting balance - Comments: light min assist needed for upright and verbal cues to correct when post lean increased Postural control: Posterior lean Standing balance support: Bilateral upper extremity supported Standing balance-Leahy Scale: Zero                              Cognition Arousal/Alertness: Awake/alert Behavior During Therapy: WFL for tasks assessed/performed Overall Cognitive Status: Within Functional Limits for tasks assessed                                        Exercises Other Exercises Other Exercises: Supine R LE therex, 1x10, act assist ROM: ankle pumps, quad sets, SAQs, heel slides, hip abduct/adduct. Very limited active movement/effort with supine therex; Other Exercises: seated ankle pumps LAQ x 10 BLE    General Comments        Pertinent Vitals/Pain Pain Assessment: 0-10 Pain Score: 9  Pain Location: back in sitting Pain Descriptors / Indicators:  Guarding;Grimacing;Sore Pain Intervention(s): Limited activity within patient's tolerance;Monitored during session    Home Living                      Prior Function            PT Goals (current goals can now be found in the care plan section) Progress towards PT goals: Progressing toward goals    Frequency    7X/week      PT Plan Current plan remains appropriate    Co-evaluation              AM-PAC PT "6 Clicks" Mobility   Outcome Measure  Help needed turning from your back to your side while in a flat bed without using bedrails?: A Lot Help needed moving from lying on your back to sitting on the side of a flat bed without using bedrails?: A Lot Help needed moving to and from a bed to a chair (including a wheelchair)?: Total Help  needed standing up from a chair using your arms (e.g., wheelchair or bedside chair)?: Total Help needed to walk in hospital room?: Total Help needed climbing 3-5 steps with a railing? : Total 6 Click Score: 8    End of Session Equipment Utilized During Treatment: Gait belt Activity Tolerance: Treatment limited secondary to medical complications (Comment);Patient limited by pain Patient left: in bed;with call bell/phone within reach;with bed alarm set Nurse Communication: Other (comment) Pain - Right/Left: Right Pain - part of body: Hip     Time: [id number] PT Time Calculation (min) (ACUTE ONLY): 14 min  Charges:  $Therapeutic Exercise: 8-22 mins                     Danielle DessSarah Venita Seng, PTA 11/21/18, 9:33 AM

## 2018-11-21 NOTE — Plan of Care (Signed)
Patient does not want to move in bed, does not want staff to help her move. Patient refused bed change and personal care.    Problem: Education: Goal: Knowledge of General Education information will improve Description: Including pain rating scale, medication(s)/side effects and non-pharmacologic comfort measures Outcome: Progressing   Problem: Health Behavior/Discharge Planning: Goal: Ability to manage health-related needs will improve Outcome: Progressing   Problem: Clinical Measurements: Goal: Ability to maintain clinical measurements within normal limits will improve Outcome: Progressing Goal: Will remain free from infection Outcome: Progressing Goal: Diagnostic test results will improve Outcome: Progressing Goal: Respiratory complications will improve Outcome: Progressing Goal: Cardiovascular complication will be avoided Outcome: Progressing   Problem: Activity: Goal: Risk for activity intolerance will decrease Outcome: Progressing   Problem: Nutrition: Goal: Adequate nutrition will be maintained Outcome: Progressing   Problem: Coping: Goal: Level of anxiety will decrease Outcome: Progressing   Problem: Elimination: Goal: Will not experience complications related to bowel motility Outcome: Progressing Goal: Will not experience complications related to urinary retention Outcome: Progressing   Problem: Pain Managment: Goal: General experience of comfort will improve Outcome: Progressing   Problem: Safety: Goal: Ability to remain free from injury will improve Outcome: Progressing   Problem: Skin Integrity: Goal: Risk for impaired skin integrity will decrease Outcome: Progressing   Problem: Education: Goal: Verbalization of understanding the information provided (i.e., activity precautions, restrictions, etc) will improve Outcome: Progressing Goal: Individualized Educational Video(s) Outcome: Progressing   Problem: Activity: Goal: Ability to ambulate and  perform ADLs will improve Outcome: Progressing   Problem: Clinical Measurements: Goal: Postoperative complications will be avoided or minimized Outcome: Progressing   Problem: Self-Concept: Goal: Ability to maintain and perform role responsibilities to the fullest extent possible will improve Outcome: Progressing   Problem: Pain Management: Goal: Pain level will decrease Outcome: Progressing

## 2018-11-21 NOTE — Progress Notes (Addendum)
[employer] - [place] at [facility]   PATIENT NAME: Sarah Mckee    MR#:  [id number]  DATE OF BIRTH:  03/01/1946  SUBJECTIVE:  CHIEF COMPLAINT:   Chief Complaint  Patient presents with  . Fall  . Hip Pain   Patient is seen at the bedside. Still with moderate amount of pain. S/p right hip surgery POD # 3. Still complains of back pain since her fall. She state pain is worse with movement.  COPD on chronic home oxygen  REVIEW OF SYSTEMS:  Review of Systems  Constitutional: Positive for malaise/fatigue. Negative for chills and fever.  HENT: Negative for congestion, ear discharge, hearing loss and nosebleeds.   Eyes: Negative for blurred vision and double vision.  Respiratory: Positive for shortness of breath and wheezing. Negative for cough.   Cardiovascular: Negative for chest pain and palpitations.  Gastrointestinal: Negative for abdominal pain, constipation, diarrhea, nausea and vomiting.  Genitourinary: Negative for dysuria.  Musculoskeletal: Positive for back pain, joint pain and myalgias.  Neurological: Positive for weakness. Negative for dizziness, speech change, focal weakness, seizures and headaches.  Psychiatric/Behavioral: Negative for depression.   DRUG ALLERGIES:   Allergies  Allergen Reactions  . Codeine Shortness Of Breath  . Demerol [Meperidine] Shortness Of Breath  . Morphine And Related Shortness Of Breath  . Oxycodone Shortness Of Breath  . Alendronate Other (See Comments)    Other reaction(s): Other (See Comments) esophagitis  . Amoxicillin-Pot Clavulanate Other (See Comments)    Unknown Has patient had a PCN reaction causing immediate rash, facial/tongue/throat swelling, SOB or lightheadedness with hypotension: Unknown Has patient had a PCN reaction causing severe rash involving mucus membranes or skin necrosis: Unknown Has patient had a PCN reaction that required hospitalization: Unknown Has patient had a PCN reaction occurring  within the last 10 years: Unknown If all of the above answers are "NO", then may proceed with Cephalosporin use.   Marland Kitchen. Risedronate Other (See Comments)    Other reaction(s): Other (See Comments) epigastric pain  . Shellfish Allergy Nausea And Vomiting    VITALS:  Blood pressure 128/66, pulse 67, temperature 98 F (36.7 C), temperature source Oral, resp. rate 19, height 5\' 2"  (1.575 m), weight 87.1 kg, SpO2 (!) 86 %.  PHYSICAL EXAMINATION:  Physical Exam   GENERAL:  73 y.o.-year-old patient lying in the bed with no acute distress.  EYES: Pupils equal, round, reactive to light and accommodation. No scleral icterus. Extraocular muscles intact.  HEENT: Head atraumatic, normocephalic. Oropharynx and nasopharynx clear.  NECK:  Supple, no jugular venous distention. No thyroid enlargement, no tenderness.  LUNGS: Normal breath sounds bilaterally, mild wheezing no rales,rhonchi or crepitation.  Decreased bibasilar breath sounds.  No use of accessory muscles of respiration.  CARDIOVASCULAR: S1, S2 normal. No  rubs, or gallops.  2/6 systolic murmur is present ABDOMEN: Soft, nontender, nondistended. Bowel sounds present. No organomegaly or mass.  EXTREMITIES: 2+ bilateral pedal edema noted.  No  cyanosis, or clubbing.  Right leg is swollen, dressing in place on the right lateral thigh NEUROLOGIC: Cranial nerves II through XII are intact. Muscle strength 5/5 in all extremities except right lower extremity due to pain. Sensation intact. Gait not checked.  PSYCHIATRIC: The patient is alert and oriented, slightly groggy from pain medication.  SKIN: No obvious rash, lesion, or ulcer.    LABORATORY PANEL:   CBC Recent Labs  Lab 11/19/18 0438  WBC 9.3  HGB 9.5*  HCT 31.2*  PLT 106*   ------------------------------------------------------------------------------------------------------------------  Chemistries  Recent Labs  Lab 11/17/18 2234  11/19/18 0438  NA 141   < > 141  K 3.9   < > 4.5   CL 95*   < > 99  CO2 39*   < > 34*  GLUCOSE 124*   < > 162*  BUN 16   < > 30*  CREATININE 0.58   < > 0.90  CALCIUM 9.0   < > 8.1*  AST 28  --   --   ALT 36  --   --   ALKPHOS 83  --   --   BILITOT 0.7  --   --    < > = values in this interval not displayed.   ------------------------------------------------------------------------------------------------------------------  Cardiac Enzymes No results for input(s): TROPONINI in the last 168 hours. ------------------------------------------------------------------------------------------------------------------  RADIOLOGY:  Dg Lumbar Spine Complete  Result Date: 11/20/2018 CLINICAL DATA:  Low back pain. EXAM: LUMBAR SPINE - COMPLETE 4+ VIEW COMPARISON:  CT scan of April 01, 2016. FINDINGS: Diffuse osteopenia is noted. No spondylolisthesis is noted. Atherosclerosis of abdominal aorta is noted. Stable old L1 and L2 compression fractures are noted. Mild compression deformity of L3 vertebral body is noted consistent with fracture of indeterminate age. No spondylolisthesis is noted. IMPRESSION: Diffuse osteopenia. Old L1 and L2 compression fractures are noted. Interval mild compression deformity of L3 vertebral body consistent with fracture of indeterminate age; MRI may be performed further evaluation. Aortic Atherosclerosis (ICD10-I70.0). Electronically Signed   By: Marijo Conception M.D.   On: 11/20/2018 10:58    EKG:   Orders placed or performed during the hospital encounter of 11/17/18  . ED EKG  . ED EKG  . EKG 12-Lead  . EKG 12-Lead   ASSESSMENT AND PLAN:   73 year old female with chronic respiratory failure secondary to COPD on 3 L home oxygen, hypertension, lymphedema, compression fx of lower back, pulmonary hypertension and chronic bilateral femoral head avascular necrosis  presents to hospital after mechanical fall and right hip pain.  1.  Right hip intertrochanteric fracture-secondary to mechanical fall - Status post ORIF,  POD # 3 - Continue pain control with Dilaudid - Continue PT, current plan is discharge to SNF [facility] - Will need [facility] orthopaedic follow up in 6 weeks for repeat x-rays of the right femur - Ortho input appreciated  2.  Chronic respiratory failure-secondary to COPD.  On 3 L home oxygen all the time - No acute exacerbation identified - Continue inhalers, nebulizers.  - Lasix PRN  3. Chronic compression fx of vertebral - worsening pain since fall. Hx of osteoporosis s/p Fosamax and Actonel -  Lumbar verterbral x-ray shows old fxs and interval mild compression of indeterminate age - Continue pain management as above - Start Lidocaine patch - Will need follow with ortho as above  4. Hypertension-on Maxzide  5. Depression-Zoloft  6. DVT prophylaxis- Continue Lovenox  7. Thombocytopenia - unknown etiology, no signs of bleeding or infection - Continue to monitor   All the records are reviewed and case discussed with Care Management/Social Workerr. Management plans discussed with the patient, family and they are in agreement.  CODE STATUS: Full code  TOTAL TIME TAKING CARE OF THIS PATIENT: 36 minutes.   POSSIBLE D/C IN 2-3 DAYS, DEPENDING ON CLINICAL CONDITION.   Karen Kays  DNP on 11/21/2018 at 9:54 AM  Between 7am to 6pm - Pager - [phone]  After 6pm go to [email or web address] - password [email or web address]  [employer]  [phone]  CC: Primary care physician; Patrice ParadiseMcLaughlin, Miriam K, MD

## 2018-11-22 LAB — CBC WITH DIFFERENTIAL/PLATELET
Abs Immature Granulocytes: 0.05 10*3/uL (ref 0.00–0.07)
Basophils Absolute: 0 10*3/uL (ref 0.0–0.1)
Basophils Relative: 0 %
Eosinophils Absolute: 0.1 10*3/uL (ref 0.0–0.5)
Eosinophils Relative: 1 %
HCT: 28.4 % — ABNORMAL LOW (ref 36.0–46.0)
Hemoglobin: 8.6 g/dL — ABNORMAL LOW (ref 12.0–15.0)
Immature Granulocytes: 1 %
Lymphocytes Relative: 9 %
Lymphs Abs: 0.7 10*3/uL (ref 0.7–4.0)
MCH: 30.6 pg (ref 26.0–34.0)
MCHC: 30.3 g/dL (ref 30.0–36.0)
MCV: 101.1 fL — ABNORMAL HIGH (ref 80.0–100.0)
Monocytes Absolute: 0.9 10*3/uL (ref 0.1–1.0)
Monocytes Relative: 11 %
Neutro Abs: 6.6 10*3/uL (ref 1.7–7.7)
Neutrophils Relative %: 78 %
Platelets: 105 10*3/uL — ABNORMAL LOW (ref 150–400)
RBC: 2.81 MIL/uL — ABNORMAL LOW (ref 3.87–5.11)
RDW: 13.4 % (ref 11.5–15.5)
WBC: 8.3 10*3/uL (ref 4.0–10.5)
nRBC: 0 % (ref 0.0–0.2)

## 2018-11-22 LAB — BASIC METABOLIC PANEL
Anion gap: 6 (ref 5–15)
BUN: 17 mg/dL (ref 8–23)
CO2: 39 mmol/L — ABNORMAL HIGH (ref 22–32)
Calcium: 8.1 mg/dL — ABNORMAL LOW (ref 8.9–10.3)
Chloride: 95 mmol/L — ABNORMAL LOW (ref 98–111)
Creatinine, Ser: 0.39 mg/dL — ABNORMAL LOW (ref 0.44–1.00)
GFR calc Af Amer: >60 mL/min (ref >60)
GFR calc non Af Amer: >60 mL/min (ref >60)
Glucose, Bld: 110 mg/dL — ABNORMAL HIGH (ref 70–99)
Potassium: 4 mmol/L (ref 3.5–5.1)
Sodium: 140 mmol/L (ref 135–145)

## 2018-11-22 MED ORDER — ENOXAPARIN SODIUM 40 MG/0.4ML ~~LOC~~ SOLN: 40.0000 mg | SUBCUTANEOUS | 0 refills | Status: AC

## 2018-11-22 MED ORDER — HYDROMORPHONE HCL 2 MG PO TABS: 2.0000 mg | ORAL_TABLET | Freq: Four times a day (QID) | ORAL | 0 refills | Status: DC | PRN

## 2018-11-22 NOTE — Discharge Instructions (Signed)
Upon discharge from hospital, continue Lovenox 40mg  daily for DVT prevention. Staples can be removed on 12/17/2018 by rehab facility.  Follow-up with [facility] in 6 weeks for repeat x-rays of the right femur.

## 2018-11-22 NOTE — Care Management Important Message (Signed)
Important Message  Patient Details  Name: Sarah Mckee MRN: [id number] Date of Birth: 26-Aug-1945   Medicare Important Message Given:  Yes     [name] 11/22/2018, 12:16 PM

## 2018-11-22 NOTE — Progress Notes (Signed)
Physical Therapy Treatment Patient Details Name: Sarah Mckee MRN: [id number] DOB: August 30, 1945 Today's Date: 11/22/2018    History of Present Illness 73 year old female with chronic respiratory failure secondary to COPD on 3 L home oxygen, hypertension, lymphedema, pulmonary hypertension presents to hospital after mechanical fall and right hip pain. Pt s/p R IM nail on 7/2 (WBAT)    PT Comments    Pt agreeable to PT; reports 9-9.5 pain in R hip and back and increases to 10 with rolling bed mobility for personal hygiene. 2 person Max A for rolling in bed with 1 person Max A to hold with other performing personal hygiene. Total A of 2 to reposition upward in bed. Pt performs supine bed level exercises with A throughout demonstrating limited range bilaterally with greater limitations on R. Poor overall strength as well. Encouraged continued work throughout the day on ankle pumps and isometric quad/glut squeezes. Pt unable to self assist for LE exercises. Continue PT to progress range, strength to improve ability to self assist with functional bed mobility and transfers.    Follow Up Recommendations  SNF     Equipment Recommendations       Recommendations for Other Services       Precautions / Restrictions Precautions Precautions: Fall Restrictions Weight Bearing Restrictions: Yes RLE Weight Bearing: Weight bearing as tolerated    Mobility  Bed Mobility Overal bed mobility: Needs Assistance Bed Mobility: Rolling Rolling: +2 for physical assistance;Max assist(1 Max A to hold position during personal hygiene)         General bed mobility comments: Total x 2 to reposition upward in bed  Transfers                 General transfer comment: Unable to attempt  Ambulation/Gait                 Stairs             Wheelchair Mobility    Modified Rankin (Stroke Patients Only)       Balance                                             Cognition Arousal/Alertness: Awake/alert Behavior During Therapy: WFL for tasks assessed/performed Overall Cognitive Status: Within Functional Limits for tasks assessed                                        Exercises General Exercises - Lower Extremity Ankle Circles/Pumps: AROM;Both;20 reps Quad Sets: Strengthening;Both;10 reps Gluteal Sets: Strengthening;Both;10 reps Short Arc QuadSinclair Ship;Both;10 reps(poor bilaterally) Heel Slides: AAROM;Both;10 reps;Other (comment)(very limited R) Hip ABduction/ADduction: AAROM;Both;10 reps Straight Leg Raises: AAROM;Left;5 reps(PROM R)    General Comments        Pertinent Vitals/Pain Pain Assessment: 0-10 Pain Score: 10-Worst pain ever(9.5 increasing to 10 with rolling) Pain Location: R hip and back  Pain Descriptors / Indicators: Constant;Grimacing;Guarding;Moaning;Sharp;Other (Comment)(increases with movement and rolling) Pain Intervention(s): Limited activity within patient's tolerance;Monitored during session;Repositioned;Patient requesting pain meds-RN notified    Home Living                      Prior Function            PT Goals (current goals can now be found in the care plan  section) Progress towards PT goals: Progressing toward goals(slowly)    Frequency    7X/week      PT Plan Current plan remains appropriate    Co-evaluation              AM-PAC PT "6 Clicks" Mobility   Outcome Measure  Help needed turning from your back to your side while in a flat bed without using bedrails?: A Lot Help needed moving from lying on your back to sitting on the side of a flat bed without using bedrails?: A Lot Help needed moving to and from a bed to a chair (including a wheelchair)?: Total Help needed standing up from a chair using your arms (e.g., wheelchair or bedside chair)?: Total Help needed to walk in hospital room?: Total Help needed climbing 3-5 steps with a railing? : Total 6 Click Score:  8    End of Session   Activity Tolerance: Patient limited by fatigue;Patient limited by pain Patient left: in bed;with call bell/phone within reach;with bed alarm set;with SCD's reapplied   PT Visit Diagnosis: Muscle weakness (generalized) (M62.81);Difficulty in walking, not elsewhere classified (R26.2);Pain Pain - Right/Left: Right Pain - part of body: Hip     Time: 1135-1208 PT Time Calculation (min) (ACUTE ONLY): 33 min  Charges:  $Therapeutic Exercise: 8-22 mins $Therapeutic Activity: 8-22 mins                      Scot DockHeidi E Santrice Muzio, PTA 11/22/2018, 12:24 PM

## 2018-11-22 NOTE — TOC Progression Note (Signed)
Transition of Care [facility]) - Progression Note    Patient Details  Name: Sarah Mckee MRN: [id number] Date of Birth: 07-07-1945  Transition of Care [facility]) CM/SW [place], RN Phone Number: 11/22/2018, 3:54 PM  Clinical Narrative:     Spoke with the daughter Chrisitne and explained [facility] not have a bed to offer.  She agreed to go onto [email or web address] and compare [facility] and [facility].  She stated that she would call me back with a choice so that we can start the insurance auth process  Expected Discharge Plan: [place] Barriers to Discharge: Continued Medical Work up  Expected Discharge Plan and Services Expected Discharge Plan: [place]   Discharge Planning Services: CM Consult Post Acute Care Choice: [place] arrangements for the past 2 months: Single Family Home                 DME Arranged: N/A         HH Arranged: PT HH Agency: [facility] ([place]) Date HH Agency Contacted: 11/19/18 Time [place]: 1206 Representative spoke with at [place]: [place] ([place]) Interventions    Readmission Risk Interventions No flowsheet data found.

## 2018-11-22 NOTE — Progress Notes (Signed)
OT Cancellation Note  Patient Details Name: Sarah Mckee MRN: [id number] DOB: [birth year]   Cancelled Treatment:    Reason Eval/Treat Not Completed: Pain limiting ability to participate. Upon attempt, pt in bed, eyes closed, grimacing. Pt alert and answers questions. Pt reporting 9.5/10 pain. Pt agreeable to OT letting RN know about the pain. RN notified of pain. Will re-attempt OT tx next date.  Jeni Salles, MPH, MS, OTR/L ascom [phone] 11/22/18, 4:06 PM

## 2018-11-22 NOTE — Progress Notes (Signed)
Subjective: 4 Days Post-Op Procedure(s) (LRB): INTRAMEDULLARY (IM) NAIL INTERTROCHANTRIC (Right) Patient reports pain as moderate, she is taking dilaudid for the pain.  Last dose 11/20/2018. Still complaining of low back pain.  X-ray showed possible indeterminate aged L3 compression fracture. No abdominal pain nausea or vomiting. Plan for discharge to SNF when appropriate.   Objective: Vital signs in last 24 hours: Temp:  [98 F (36.7 C)-98.3 F (36.8 C)] 98 F (36.7 C) (07/06 0749) Pulse Rate:  [67-99] 99 (07/06 0749) Resp:  [17-18] 17 (07/06 0749) BP: (116-140)/(61-71) 140/71 (07/06 0749) SpO2:  [86 %-100 %] 99 % (07/06 0750)  Intake/Output from previous day:  Intake/Output Summary (Last 24 hours) at 11/22/2018 0808 Last data filed at 11/22/2018 0500 Gross per 24 hour  Intake 240 ml  Output 1300 ml  Net -1060 ml    Intake/Output this shift: No intake/output data recorded.  Labs: Recent Labs    11/22/18 0427  HGB 8.6*   Recent Labs    11/22/18 0427  WBC 8.3  RBC 2.81*  HCT 28.4*  PLT 105*   Recent Labs    11/22/18 0427  NA 140  K 4.0  CL 95*  CO2 39*  BUN 17  CREATININE 0.39*  GLUCOSE 110*  CALCIUM 8.1*   No results for input(s): LABPT, INR in the last 72 hours. EXAM General - Patient is Alert, Appropriate and Oriented  Extremity - ABD soft Sensation intact distally Intact pulses distally Dorsiflexion/Plantar flexion intact Incision: dressing C/D/I No cellulitis present Dressing/Incision - clean, dry, no drainage Motor Function - intact, moving foot and toes well on exam.   Past Medical History:  Diagnosis Date  . Arthritis   . COPD (chronic obstructive pulmonary disease) (HCC)    2L 02 at baseline  . Hypertension   . Lymphedema of left leg   . Lymphedema of right lower extremity   . Pulmonary HTN (HCC)     Assessment/Plan: 4 Days Post-Op Procedure(s) (LRB): INTRAMEDULLARY (IM) NAIL INTERTROCHANTRIC (Right) Active Problems:   Closed  right hip fracture (HCC)  Estimated body mass index is 35.12 kg/m as calculated from the following:   Height as of this encounter: 5\' 2"  (1.575 m).   Weight as of this encounter: 87.1 kg. Advance diet Up with therapy   Continue with dilaudid for pain, allergic to other medications.  Last dose 11/20/2018  Possible new acute lumbar compression fracture.  Age-indeterminate.  History of chronic compression fractures.  We will continue to monitor.  Patient will follow-up in clinic and will consider MRI if persistent pain.  Up with therapy today.  Current plan is for discharge to SNF.  Continue to work on bowel movement.  Suppository given this morning.  Upon discharge from hospital, continue Lovenox 40mg  daily for DVT prevention. Staples can be removed on 12/07/2018 by rehab facility.  Follow-up with [facility] in 6 weeks for repeat x-rays of the right femur.  DVT Prophylaxis - Lovenox, TED hose and SCDs Weight-Bearing as tolerated to right leg  Duanne Guess, PA-C [facility] 11/22/2018, 8:08 AM

## 2018-11-22 NOTE — Progress Notes (Signed)
Patient c/o being unable to urinate, Bladder scan of 476mL. On call NP Gardiner Barefoot contacted. Order to cath with foley and leave it if greater than 489ml. 500 mL was removed.

## 2018-11-22 NOTE — TOC Progression Note (Signed)
Transition of Care [facility]) - Progression Note    Patient Details  Name: Sarah Mckee MRN: [id number] Date of Birth: [birth year]  Transition of Care [facility]) CM/SW Contact  Su Hilt, RN Phone Number: 11/22/2018, 4:34 PM  Clinical Narrative:     Daughter called and asked that I fax the notes to [facility] in [place] at [phone], fax sent, awaiting to hear back   Expected Discharge Plan: [place] Barriers to Discharge: Continued Medical Work up  Expected Discharge Plan and Services Expected Discharge Plan: [place]   Discharge Planning Services: CM Consult Post Acute Care Choice: [place] arrangements for the past 2 months: Single Family Home                 DME Arranged: N/A         HH Arranged: PT HH Agency: [facility] ([place]) Date HH Agency Contacted: 11/19/18 Time [place]: 1206 Representative spoke with at [place]: [place] ([place]) Interventions    Readmission Risk Interventions No flowsheet data found.

## 2018-11-22 NOTE — TOC Progression Note (Signed)
Transition of Care [facility]) - Progression Note    Patient Details  Name: Sarah Mckee MRN: [id number] Date of Birth: 30-Jul-1945  Transition of Care [facility]) CM/SW Contact  Su Hilt, RN Phone Number: 11/22/2018, 4:03 PM  Clinical Narrative:     Damaris Schooner to [place] at [facility] and requested to start insurance authorization, I will call her with the facility once the daughter makes a choice  Expected Discharge Plan: [place] Barriers to Discharge: Continued Medical Work up  Expected Discharge Plan and Services Expected Discharge Plan: [place]   Discharge Planning Services: CM Consult Post Acute Care Choice: [place] arrangements for the past 2 months: Single Family Home                 DME Arranged: N/A         HH Arranged: PT HH Agency: [facility] ([place]) Date HH Agency Contacted: 11/19/18 Time [place]: 1206 Representative spoke with at [place]: [place] ([place]) Interventions    Readmission Risk Interventions No flowsheet data found.

## 2018-11-22 NOTE — TOC Progression Note (Signed)
Transition of Care [facility]) - Progression Note    Patient Details  Name: Sarah Mckee MRN: [id number] Date of Birth: 1945/08/31  Transition of Care [facility]) CM/SW Contact  Su Hilt, RN Phone Number: 11/22/2018, 1:07 PM  Clinical Narrative:     Magda Paganini at [facility] called and stated that they can not accept the patient, sent request for more bed offers to review with the partient  Expected Discharge Plan: [place] Barriers to Discharge: Continued Medical Work up  Expected Discharge Plan and Services Expected Discharge Plan: [place]   Discharge Planning Services: CM Consult Post Acute Care Choice: [place] arrangements for the past 2 months: Single Family Home                 DME Arranged: N/A         HH Arranged: PT HH Agency: [facility] ([place]) Date HH Agency Contacted: 11/19/18 Time [place]: 1206 Representative spoke with at [place]: [place] ([place]) Interventions    Readmission Risk Interventions No flowsheet data found.

## 2018-11-22 NOTE — Progress Notes (Signed)
[employer] - [place] at [facility]   PATIENT NAME: Sarah MedalJanet Mckee    MR#:  [id number]  DATE OF BIRTH:  1946-04-20  SUBJECTIVE:  CHIEF COMPLAINT:   Chief Complaint  Patient presents with  . Fall  . Hip Pain   Patient is seen at the bedside. Still with moderate amount of pain. S/p right hip surgery POD # 4. Still complains of back pain since her fall. She state pain is worse with movement. She is refusing repositioning or movement due to extreme pain  COPD on chronic home oxygen  REVIEW OF SYSTEMS:  Review of Systems  Constitutional: Positive for malaise/fatigue. Negative for chills and fever.  HENT: Negative for congestion, ear discharge, hearing loss and nosebleeds.   Eyes: Negative for blurred vision and double vision.  Respiratory: Positive for shortness of breath and wheezing. Negative for cough.   Cardiovascular: Negative for chest pain and palpitations.  Gastrointestinal: Negative for abdominal pain, constipation, diarrhea, nausea and vomiting.  Genitourinary: Negative for dysuria.  Musculoskeletal: Positive for back pain, joint pain and myalgias.  Neurological: Positive for weakness. Negative for dizziness, speech change, focal weakness, seizures and headaches.  Psychiatric/Behavioral: Negative for depression.   DRUG ALLERGIES:   Allergies  Allergen Reactions  . Codeine Shortness Of Breath  . Demerol [Meperidine] Shortness Of Breath  . Morphine And Related Shortness Of Breath  . Oxycodone Shortness Of Breath  . Alendronate Other (See Comments)    Other reaction(s): Other (See Comments) esophagitis  . Amoxicillin-Pot Clavulanate Other (See Comments)    Unknown Has patient had a PCN reaction causing immediate rash, facial/tongue/throat swelling, SOB or lightheadedness with hypotension: Unknown Has patient had a PCN reaction causing severe rash involving mucus membranes or skin necrosis: Unknown Has patient had a PCN reaction that required  hospitalization: Unknown Has patient had a PCN reaction occurring within the last 10 years: Unknown If all of the above answers are "NO", then may proceed with Cephalosporin use.   Marland Kitchen. Risedronate Other (See Comments)    Other reaction(s): Other (See Comments) epigastric pain  . Shellfish Allergy Nausea And Vomiting    VITALS:  Blood pressure 114/65, pulse 96, temperature 98.4 F (36.9 C), temperature source Oral, resp. rate 16, height 5\' 2"  (1.575 m), weight 87.1 kg, SpO2 98 %.  PHYSICAL EXAMINATION:  Physical Exam   GENERAL:  73 y.o.-year-old patient lying in the bed with no acute distress.  EYES: Pupils equal, round, reactive to light and accommodation. No scleral icterus. Extraocular muscles intact.  HEENT: Head atraumatic, normocephalic. Oropharynx and nasopharynx clear.  NECK:  Supple, no jugular venous distention. No thyroid enlargement, no tenderness.  LUNGS: Normal breath sounds bilaterally, mild wheezing no rales,rhonchi or crepitation.  Decreased bibasilar breath sounds.  No use of accessory muscles of respiration.  CARDIOVASCULAR: S1, S2 normal. No  rubs, or gallops.  2/6 systolic murmur is present ABDOMEN: Soft, nontender, nondistended. Bowel sounds present. No organomegaly or mass.  EXTREMITIES: 2+ bilateral pedal edema noted.  No  cyanosis, or clubbing.  Right leg is swollen, dressing in place on the right lateral thigh NEUROLOGIC: Cranial nerves II through XII are intact. Muscle strength 5/5 in all extremities except right lower extremity due to pain. Sensation intact. Gait not checked.  PSYCHIATRIC: The patient is alert and oriented, slightly groggy from pain medication.  SKIN: No obvious rash, lesion, or ulcer.   LABORATORY PANEL:   CBC Recent Labs  Lab 11/22/18 0427  WBC 8.3  HGB 8.6*  HCT  28.4*  PLT 105*   ------------------------------------------------------------------------------------------------------------------  Chemistries  Recent Labs  Lab  11/17/18 2234  11/22/18 0427  NA 141   < > 140  K 3.9   < > 4.0  CL 95*   < > 95*  CO2 39*   < > 39*  GLUCOSE 124*   < > 110*  BUN 16   < > 17  CREATININE 0.58   < > 0.39*  CALCIUM 9.0   < > 8.1*  AST 28  --   --   ALT 36  --   --   ALKPHOS 83  --   --   BILITOT 0.7  --   --    < > = values in this interval not displayed.   ------------------------------------------------------------------------------------------------------------------  Cardiac Enzymes No results for input(s): TROPONINI in the last 168 hours. ------------------------------------------------------------------------------------------------------------------  RADIOLOGY:  No results found.  EKG:   Orders placed or performed during the hospital encounter of 11/17/18  . ED EKG  . ED EKG  . EKG 12-Lead  . EKG 12-Lead   ASSESSMENT AND PLAN:   73 year old female with chronic respiratory failure secondary to COPD on 3 L home oxygen, hypertension, lymphedema, compression fx of lower back, pulmonary hypertension and chronic bilateral femoral head avascular necrosis  presents to hospital after mechanical fall and right hip pain.  1.  Right hip intertrochanteric fracture-secondary to mechanical fall - Status post ORIF, POD # 4 - Continue pain control with Dilaudid - Refusing PT, current plan is discharge to SN[facility] - Will need [facility] orthopaedic follow up in 6 weeks for repeat x-rays of the right femur - Ortho input appreciated  2.  Chronic respiratory failure-secondary to COPD.  On 3 L home oxygen all the time - No acute exacerbation identified - Continue inhalers, nebulizers.  - Lasix PRN  3. Chronic compression fx of vertebral - worsening pain since fall. Hx of osteoporosis s/p Fosamax and Actonel -  Lumbar verterbral x-ray shows old fxs and interval mild compression of indeterminate age. Still with extreme pain will consider better imaging if no improvement by tomorrow. - Continue pain management as  above - offered Lidocaine patch but refused - Will need follow with ortho as above  4. Hypertension-on Maxzide  5. Depression-Zoloft  6. DVT prophylaxis- Continue Lovenox  7. Thombocytopenia - unknown etiology, no signs of bleeding or infection - Continue to monitor   All the records are reviewed and case discussed with Care Management/Social Workerr. Management plans discussed with the patient, family and they are in agreement.  CODE STATUS: Full code  TOTAL TIME TAKING CARE OF THIS PATIENT: 36 minutes.   POSSIBLE D/C IN 2-3 DAYS, DEPENDING ON CLINICAL CONDITION.   Karen Kays  DNP on 11/22/2018 at 9:07 PM  Between 7am to 6pm - Pager - [phone]  After 6pm go to [email or web address] - password [email or web address] [facility]  Office  [phone]  CC: Primary care physician; Marinda Elk, MD

## 2018-11-22 NOTE — Plan of Care (Signed)
Patient has foley, would like nystatin mouth wash. No BM yet, resistant and refusing being turned, due to "that will hurt, I know it"   Problem: Education: Goal: Knowledge of General Education information will improve Description: Including pain rating scale, medication(s)/side effects and non-pharmacologic comfort measures Outcome: Progressing   Problem: Health Behavior/Discharge Planning: Goal: Ability to manage health-related needs will improve Outcome: Progressing   Problem: Clinical Measurements: Goal: Ability to maintain clinical measurements within normal limits will improve Outcome: Progressing Goal: Will remain free from infection Outcome: Progressing Goal: Diagnostic test results will improve Outcome: Progressing Goal: Respiratory complications will improve Outcome: Progressing Goal: Cardiovascular complication will be avoided Outcome: Progressing   Problem: Activity: Goal: Risk for activity intolerance will decrease Outcome: Progressing   Problem: Nutrition: Goal: Adequate nutrition will be maintained Outcome: Progressing   Problem: Coping: Goal: Level of anxiety will decrease Outcome: Progressing   Problem: Elimination: Goal: Will not experience complications related to bowel motility Outcome: Progressing Goal: Will not experience complications related to urinary retention Outcome: Progressing   Problem: Pain Managment: Goal: General experience of comfort will improve Outcome: Progressing   Problem: Safety: Goal: Ability to remain free from injury will improve Outcome: Progressing   Problem: Skin Integrity: Goal: Risk for impaired skin integrity will decrease Outcome: Progressing   Problem: Education: Goal: Verbalization of understanding the information provided (i.e., activity precautions, restrictions, etc) will improve Outcome: Progressing Goal: Individualized Educational Video(s) Outcome: Progressing   Problem: Activity: Goal: Ability to  ambulate and perform ADLs will improve Outcome: Progressing   Problem: Clinical Measurements: Goal: Postoperative complications will be avoided or minimized Outcome: Progressing   Problem: Self-Concept: Goal: Ability to maintain and perform role responsibilities to the fullest extent possible will improve Outcome: Progressing   Problem: Pain Management: Goal: Pain level will decrease Outcome: Progressing

## 2018-11-23 ENCOUNTER — Inpatient Hospital Stay: Payer: PPO

## 2018-11-23 ENCOUNTER — Telehealth: Payer: Self-pay | Admitting: Primary Care

## 2018-11-23 LAB — SARS CORONAVIRUS 2 BY RT PCR (HOSPITAL ORDER, PERFORMED IN ~~LOC~~ HOSPITAL LAB): SARS Coronavirus 2: NEGATIVE

## 2018-11-23 NOTE — Progress Notes (Signed)
Physical Therapy Treatment Patient Details Name: Sarah Mckee MRN: [id number] DOB: 1946/01/06 Today's Date: 11/23/2018    History of Present Illness 73 year old female with chronic respiratory failure secondary to COPD on 3 L home oxygen, hypertension, lymphedema, pulmonary hypertension presents to hospital after mechanical fall and right hip pain. Pt s/p R IM nail on 7/2 ([facility]AT)    PT Comments    Pt had recently received pain medication on first attempt.  Pain 10/10 and requested I return later.  Returned and pt ready for session despite continued pain.  Pt stated she had a BM.  Tech came and assisted in rolling max a x 2 and was found to be clean and try.  Pt yelling out in pain with rolling and is able to assist very little.  She is able to hold rail but she has no ability to maintain position without continued max a x 2.  Participated in exercises as described below.  Pt tolerates very minimal AAROM with only a small ROM for all ranges.  Slightly improved LLE but remains minimal.  She is unable to tolerate attempts at sitting or transfers today as pain remains 10/10 despite pain meds and ROM for comfort.  She does have a large dark bruise on her R lower leg which I do not recall from Sunday.  RN in to check.  SCD's left off at this time as we are unsure of the cause of bruising.  Discussed pain medication with pt.  Stressed importance of pain medication as scheduled and ordered to allow her to participate in therapy sessions.  She voiced understanding but stated her hesitance to take too much as she fears she will not be able to take it when needed for therapy or care with nursing.  Reviewed importance of maintaining a schedule with pain medication at this time because pain is primary barrier for mobility and risks of immobility post op including DVT and pneumonia.  Voiced understanding. Back and RLE pain remain barriers to progression of mobility.   Follow Up Recommendations  SNF      Equipment Recommendations       Recommendations for Other Services       Precautions / Restrictions Precautions Precautions: Fall Precaution Comments: skin tears and bruises easily Restrictions Weight Bearing Restrictions: Yes RLE Weight Bearing: Weight bearing as tolerated    Mobility  Bed Mobility Overal bed mobility: Needs Assistance Bed Mobility: Rolling Rolling: +2 for physical assistance;Max assist         General bed mobility comments: Total x 2 to reposition upward in bed   Unable to tolerate further mobility at this time  Transfers                    Ambulation/Gait                 Stairs             Wheelchair Mobility    Modified Rankin (Stroke Patients Only)       Balance                                            Cognition Arousal/Alertness: Awake/alert Behavior During Therapy: WFL for tasks assessed/performed Overall Cognitive Status: Within Functional Limits for tasks assessed  Exercises General Exercises - Lower Extremity Ankle Circles/Pumps: AROM;Both;20 reps Quad Sets: Strengthening;Both;10 reps Gluteal Sets: Strengthening;Both;20 reps Short Arc QuadBarbaraann Boys: AAROM;Both;20 reps Heel Slides: AAROM;Both;20 reps;Supine Hip ABduction/ADduction: AAROM;Both;20 reps Straight Leg Raises: AAROM;20 reps;Both;Supine    General Comments        Pertinent Vitals/Pain Pain Assessment: 0-10 Pain Score: 10-Worst pain ever Pain Descriptors / Indicators: Constant;Grimacing;Guarding;Moaning;Sharp;Other (Comment) Pain Intervention(s): Limited activity within patient's tolerance;Monitored during session;Premedicated before session    Home Living                      Prior Function            PT Goals (current goals can now be found in the care plan section) Progress towards PT goals: Not progressing toward goals - comment    Frequency     7X/week      PT Plan Current plan remains appropriate    Co-evaluation              AM-PAC PT "6 Clicks" Mobility   Outcome Measure  Help needed turning from your back to your side while in a flat bed without using bedrails?: Total Help needed moving from lying on your back to sitting on the side of a flat bed without using bedrails?: Total Help needed moving to and from a bed to a chair (including a wheelchair)?: Total Help needed standing up from a chair using your arms (e.g., wheelchair or bedside chair)?: Total Help needed to walk in hospital room?: Total Help needed climbing 3-5 steps with a railing? : Total 6 Click Score: 6    End of Session Equipment Utilized During Treatment: Gait belt Activity Tolerance: Patient limited by pain Patient left: in bed;with call bell/phone within reach;with bed alarm set;with SCD's reapplied Nurse Communication: Other (comment);Mobility status Pain - Right/Left: Right Pain - part of body: Hip     Time: 1020-1043 PT Time Calculation (min) (ACUTE ONLY): 23 min  Charges:  $Therapeutic Exercise: 23-37 mins                    Danielle DessSarah Yamir Carignan, PTA 11/23/18, 11:09 AM'

## 2018-11-23 NOTE — Progress Notes (Signed)
Subjective: 5 Days Post-Op Procedure(s) (LRB): INTRAMEDULLARY (IM) NAIL INTERTROCHANTRIC (Right) Patient reports pain as moderate, she is taking dilaudid for the pain.  Last dose 8 am this am.  Still complaining of low back pain.  X-ray showed possible indeterminate aged L3 compression fracture. No abdominal pain nausea or vomiting. Plan for discharge to SNF when appropriate.   Objective: Vital signs in last 24 hours: Temp:  [98.3 F (36.8 C)-98.4 F (36.9 C)] 98.3 F (36.8 C) (07/07 0747) Pulse Rate:  [94-103] 94 (07/07 0747) Resp:  [16-17] 17 (07/07 0747) BP: (114-132)/(65-84) 132/84 (07/07 0747) SpO2:  [90 %-98 %] 98 % (07/07 0747)  Intake/Output from previous day:  Intake/Output Summary (Last 24 hours) at 11/23/2018 0824 Last data filed at 11/23/2018 0405 Gross per 24 hour  Intake 0 ml  Output 750 ml  Net -750 ml    Intake/Output this shift: No intake/output data recorded.  Labs: Recent Labs    11/22/18 0427  HGB 8.6*   Recent Labs    11/22/18 0427  WBC 8.3  RBC 2.81*  HCT 28.4*  PLT 105*   Recent Labs    11/22/18 0427  NA 140  K 4.0  CL 95*  CO2 39*  BUN 17  CREATININE 0.39*  GLUCOSE 110*  CALCIUM 8.1*   No results for input(s): LABPT, INR in the last 72 hours. EXAM General - Patient is Alert, Appropriate and Oriented  Extremity - ABD soft Sensation intact distally Intact pulses distally Dorsiflexion/Plantar flexion intact Incision: dressing C/D/I No cellulitis present Dressing/Incision - clean, dry, moderate bloody serous drainage along proximal incision site Motor Function - intact, moving foot and toes well on exam.   Past Medical History:  Diagnosis Date  . Arthritis   . COPD (chronic obstructive pulmonary disease) (HCC)    2L 02 at baseline  . Hypertension   . Lymphedema of left leg   . Lymphedema of right lower extremity   . Pulmonary HTN (HCC)     Assessment/Plan: 5 Days Post-Op Procedure(s) (LRB): INTRAMEDULLARY (IM) NAIL  INTERTROCHANTRIC (Right) Active Problems:   Closed right hip fracture (HCC)  Estimated body mass index is 35.12 kg/m as calculated from the following:   Height as of this encounter: 5\' 2"  (1.575 m).   Weight as of this encounter: 87.1 kg. Advance diet Up with therapy   Continue with dilaudid for pain, allergic to other medications.   Possible new acute lumbar compression fracture.  Age-indeterminate.  History of chronic compression fractures.  We will continue to monitor.  Patient will follow-up in clinic and will consider MRI if persistent pain.  Up with therapy today.  Current plan is for discharge to SNF.  Bowels moving well.  Upon discharge from hospital, continue Lovenox 40mg  daily for DVT prevention. Staples can be removed on 11/25/2018 by rehab facility.  Follow-up with [facility] in 6 weeks for repeat x-rays of the right femur.  DVT Prophylaxis - Lovenox, TED hose and SCDs Weight-Bearing as tolerated to right leg  Duanne Guess, PA-C [facility] 11/23/2018, 8:24 AM

## 2018-11-23 NOTE — TOC Progression Note (Addendum)
Transition of Care [facility]) - Progression Note    Patient Details  Name: Sarah Mckee MRN: [id number] Date of Birth: January 18, 1946  Transition of Care [facility]) CM/SW Contact  Su Hilt, RN Phone Number: 11/23/2018, 9:16 AM  Clinical Narrative:     Damaris Schooner with Jari Pigg with [facility] and she gave me the authorization from insurance auth number [id number] Spoke with Daughter Altha Harm and let her know we have authorization from insurance for the patient to go to rehab.  She is going to call [facility] to see if they have made a bed offer, she will call me right back and let me know if [facility]   Expected Discharge Plan: [place] Barriers to Discharge: Continued Medical Work up  Expected Discharge Plan and Services Expected Discharge Plan: [place]   Discharge Planning Services: CM Consult Post Acute Care Choice: [place] arrangements for the past 2 months: Single Family Home                 DME Arranged: N/A         HH Arranged: PT HH Agency: [place] ([place]) Date HH Agency Contacted: 11/19/18 Time [place]: 1206 Representative spoke with at [place]: [place] ([place]) Interventions    Readmission Risk Interventions No flowsheet data found.

## 2018-11-23 NOTE — Progress Notes (Signed)
OT Cancellation Note  Patient Details Name: Sarah Mckee MRN: [id number] DOB: 1945-11-10   Cancelled Treatment:    Reason Eval/Treat Not Completed: Other (comment). Chart reviewed. Pt now pending additional lumbar imaging. Pt with continued back pain. Will hold and re-attempt OT treatment at later date/time as pt is medically appropriate.   Jeni Salles, MPH, MS, OTR/L ascom [phone] 11/23/18, 3:24 PM

## 2018-11-23 NOTE — Telephone Encounter (Signed)
T/c to daughter re patient hospitalized.

## 2018-11-23 NOTE — TOC Transition Note (Addendum)
Transition of Care [facility]) - CM/SW Discharge Note   Patient Details  Name: Annis Lagoy MRN: [id number] Date of Birth: Oct 01, 1945  Transition of Care [facility]) CM/SW Contact:  Su Hilt, RN Phone Number: 11/23/2018, 2:01 PM   Clinical Narrative:    Patient to DC to room 807 at [facility], The daughter is aware of the DC for today The nurse is to call report to [facility] and EMS when ready for transport.  Must wait on Covid test to come back negative before DC to [facility]. The patient complained of too much pain to the Physician in her back, he is going to have her back checked and it shows Compression fracture in L1-L2 to get consult to see about treatment before DC   Final next level of care: Skilled Nursing Facility Barriers to Discharge: Barriers Resolved   Patient Goals and CMS Choice Patient states their goals for this hospitalization and ongoing recovery are:: go home with home health CMS Medicare.gov Compare Post Acute Care list provided to:: Patient Choice offered to / list presented to : Patient  Discharge Placement                       Discharge Plan and Services   Discharge Planning Services: CM Consult Post Acute Care Choice: Home Health          DME Arranged: N/A         HH Arranged: PT [place] Agency: [place] ([facility]) Date HH Agency Contacted: 11/19/18 Time [place]: 1206 Representative spoke with at [place]: [place] ([place]) Interventions     Readmission Risk Interventions No flowsheet data found.

## 2018-11-23 NOTE — TOC Progression Note (Signed)
Transition of Care [facility]) - Progression Note    Patient Details  Name: Sarah Mckee MRN: [id number] Date of Birth: 07/03/1945  Transition of Care [facility]) CM/SW Contact  Su Hilt, RN Phone Number: 11/23/2018, 11:03 AM  Clinical Narrative:    Altha Harm, the daughter called and stated that [facility] is not INN with [facility] therefore they chose [facility], I did explain that we anticipate the patient to go today with EMS, I  Notified Tina at [facility] and she stated that they will need a new covid test.  Patient will go to Room 807  I notified the Nurse   Expected Discharge Plan: [place] Barriers to Discharge: Continued Medical Work up  Expected Discharge Plan and Services Expected Discharge Plan: [place]   Discharge Planning Services: CM Consult Post Acute Care Choice: [place] arrangements for the past 2 months: Single Family Home                 DME Arranged: N/A         HH Arranged: PT HH Agency: [place] ([place]) Date HH Agency Contacted: 11/19/18 Time [place]: 1206 Representative spoke with at [place]: [place] ([place]) Interventions    Readmission Risk Interventions No flowsheet data found.

## 2018-11-23 NOTE — TOC Progression Note (Signed)
Transition of Care [facility]) - Progression Note    Patient Details  Name: Sarah Mckee MRN: [id number] Date of Birth: 09/20/1945  Transition of Care [facility]) CM/SW Contact  Su Hilt, RN Phone Number: 11/23/2018, 2:48 PM  Clinical Narrative:     Called Daughter to let her know that the patient will not be going to [facility] today due to back pain, there is an MRI ordered and consult to see what treatment they will do if any for the compression fracture  Expected Discharge Plan: Skilled Nursing Facility Barriers to Discharge: Barriers Resolved  Expected Discharge Plan and Services Expected Discharge Plan: [place]   Discharge Planning Services: CM Consult Post Acute Care Choice: [place] arrangements for the past 2 months: Single Family Home                 DME Arranged: N/A         HH Arranged: PT HH Agency: [place] ([place]) Date HH Agency Contacted: 11/19/18 Time [place]: 1206 Representative spoke with at [place]: [place] ([place]) Interventions    Readmission Risk Interventions No flowsheet data found.

## 2018-11-24 MED ORDER — CLINDAMYCIN PHOSPHATE 600 MG/50ML IV SOLN
600.0000 mg | Freq: Once | INTRAVENOUS | Status: AC
  Administered 2018-11-25: 600 mg via INTRAVENOUS
  Filled 2018-11-24: qty 50

## 2018-11-24 NOTE — Progress Notes (Signed)
[employer] [facility] [place] [facility]   PATIENT NAME: Sarah Mckee    MR#:  [id number]  DATE OF BIRTH:  [birth year]  SUBJECTIVE:  CHIEF COMPLAINT:   Chief Complaint  Patient presents with   Fall   Hip Pain   Patient is seen at the bedside. Still with significant pain mainly in her lower back. Pain is worse with movement and repositioning.  S/p right hip surgery POD # 5.  COPD on chronic home oxygen  REVIEW OF SYSTEMS:  Review of Systems  Constitutional: Positive for malaise/fatigue. Negative for chills and fever.  HENT: Negative for congestion, ear discharge, hearing loss and nosebleeds.   Eyes: Negative for blurred vision and double vision.  Respiratory: Positive for shortness of breath and wheezing. Negative for cough.   Cardiovascular: Negative for chest pain and palpitations.  Gastrointestinal: Negative for abdominal pain, constipation, diarrhea, nausea and vomiting.  Genitourinary: Negative for dysuria.  Musculoskeletal: Positive for back pain, joint pain and myalgias.  Neurological: Positive for weakness. Negative for dizziness, speech change, focal weakness, seizures and headaches.  Psychiatric/Behavioral: Negative for depression.   DRUG ALLERGIES:   Allergies  Allergen Reactions   Codeine Shortness Of Breath   Demerol [Meperidine] Shortness Of Breath   Morphine And Related Shortness Of Breath   Oxycodone Shortness Of Breath   Alendronate Other (See Comments)    Other reaction(s): Other (See Comments) esophagitis   Amoxicillin-Pot Clavulanate Other (See Comments)    Unknown Has patient had a PCN reaction causing immediate rash, facial/tongue/throat swelling, SOB or lightheadedness with hypotension: Unknown Has patient had a PCN reaction causing severe rash involving mucus membranes or skin necrosis: Unknown Has patient had a PCN reaction that required hospitalization: Unknown Has patient had a PCN reaction occurring within the last 10  years: Unknown If all of the above answers are "NO", then may proceed with Cephalosporin use.    Risedronate Other (See Comments)    Other reaction(s): Other (See Comments) epigastric pain   Shellfish Allergy Nausea And Vomiting    VITALS:  Blood pressure (!) 129/95, pulse 92, temperature 97.8 F (36.6 C), resp. rate 16, height 5\' 2"  (1.575 m), weight 87.1 kg, SpO2 98 %.  PHYSICAL EXAMINATION:  Physical Exam   GENERAL:  73 y.o.-year-old patient lying in the bed with no acute distress.  EYES: Pupils equal, round, reactive to light and accommodation. No scleral icterus. Extraocular muscles intact.  HEENT: Head atraumatic, normocephalic. Oropharynx and nasopharynx clear.  NECK:  Supple, no jugular venous distention. No thyroid enlargement, no tenderness.  LUNGS: Normal breath sounds bilaterally, mild wheezing no rales,rhonchi or crepitation.  Decreased bibasilar breath sounds.  No use of accessory muscles of respiration.  CARDIOVASCULAR: S1, S2 normal. No  rubs, or gallops.  2/6 systolic murmur is present ABDOMEN: Soft, nontender, nondistended. Bowel sounds present. No organomegaly or mass.  EXTREMITIES: 2+ bilateral pedal edema noted.  No  cyanosis, or clubbing.  Right leg is swollen, dressing in place on the right lateral thigh NEUROLOGIC: Cranial nerves II through XII are intact. Muscle strength 5/5 in all extremities except right lower extremity due to pain. Sensation intact. Gait not checked.  PSYCHIATRIC: The patient is alert and oriented, slightly groggy from pain medication.  SKIN: No obvious rash, lesion, or ulcer.   LABORATORY PANEL:   CBC Recent Labs  Lab 11/22/18 0427  WBC 8.3  HGB 8.6*  HCT 28.4*  PLT 105*   ------------------------------------------------------------------------------------------------------------------  Chemistries  Recent Labs  Lab 11/17/18 2234  11/22/18 0427  NA 141   < > 140  K 3.9   < > 4.0  CL 95*   < > 95*  CO2 39*   < > 39*    GLUCOSE 124*   < > 110*  BUN 16   < > 17  CREATININE 0.58   < > 0.39*  CALCIUM 9.0   < > 8.1*  AST 28  --   --   ALT 36  --   --   ALKPHOS 83  --   --   BILITOT 0.7  --   --    < > = values in this interval not displayed.   ------------------------------------------------------------------------------------------------------------------  Cardiac Enzymes No results for input(s): TROPONINI in the last 168 hours. ------------------------------------------------------------------------------------------------------------------  RADIOLOGY:  Mr Lumbar Spine Wo Contrast  Result Date: 11/23/2018 CLINICAL DATA:  [place].  Back pain and hip pain. EXAM: MRI LUMBAR SPINE WITHOUT CONTRAST TECHNIQUE: Multiplanar, multisequence MR imaging of the lumbar spine was performed. No intravenous contrast was administered. COMPARISON:  CT scan 04/01/2016 FINDINGS: Segmentation: There are five lumbar type vertebral bodies. The last full intervertebral disc space is labeled L5-S1. This correlates with the prior CT scan. Alignment:  Normal Vertebrae: There are acute or subacute fractures of L3 and L4. Minimal posterior compression deformity of L3. No significant compression deformity of L4. The L3 fracture extends through the midportion of the vertebrae and the L4 fracture is more superior. There is very slight buckling of the posterior cortex of L3 with minimal retropulsion. No canal compromise. Remote compression fracture of L1 and L2. Conus medullaris and cauda equina: Conus extends to the bottom of L1 level. Conus and cauda equina appear normal. Paraspinal and other soft tissues: No significant paraspinal or retroperitoneal findings. There is a moderate-sized left renal cyst noted. Disc levels: T12-L1: Remote compression fracture of L1 with minimal retropulsion but no canal compromise. No disc protrusions or foraminal stenosis. L1-2: Mild bulging annulus and slight flattening of the ventral thecal sac but no significant  spinal or foraminal stenosis. L2-3: No significant findings.  Mild facet disease. L3-4: Mild annular bulge with slight flattening of the ventral thecal sac but no significant spinal or foraminal stenosis. L4-5: Bulging annulus and moderate facet disease with mild bilateral lateral recess encroachment. No spinal or foraminal stenosis. L5-S1: Moderate facet disease but no disc protrusions, spinal or foraminal stenosis. IMPRESSION: 1. Mild acute or subacute compression fractures of L3 and L4 as detailed above. No significant retropulsion or canal compromise. 2. Remote L1 and L2 compression fractures. 3. No significant disc protrusions, spinal or foraminal stenosis in the lumbar spine. Electronically Signed   By: Marijo Sanes M.D.   On: 11/23/2018 16:53    EKG:   Orders placed or performed during the hospital encounter of 11/17/18   ED EKG   ED EKG   EKG 12-Lead   EKG 12-Lead   ASSESSMENT AND PLAN:   73 year old female with chronic respiratory failure secondary to COPD on 3 L home oxygen, hypertension, lymphedema, compression fx of lower back, pulmonary hypertension and chronic bilateral femoral head avascular necrosis  presents to hospital after mechanical fall and right hip pain.  1.  Right hip intertrochanteric fracture-secondary to mechanical fall - Status post ORIF, POD # 6 - Continue pain control with Dilaudid - PT/OT following current plan is discharge to SNF [facility] - Will need [facility] orthopaedic follow up in 6 weeks for repeat x-rays of the right femur - Ortho input appreciated  2.  Chronic respiratory failure-secondary to COPD.  On 3 L home oxygen all the time - No acute exacerbation identified - Continue inhalers, nebulizers.  - Lasix PRN  3. Chronic compression fx of vertebral - worsening pain since fall. Hx of osteoporosis s/p Fosamax and Actonel -  Lumbar verterbral x-ray shows old fxs and interval mild compression of indeterminate age.  - Follow up MRI lumbar shows  acute/subacute compression fractures of L3-L4 - Ortho planning on Kyphoplasty tomorrow - Continue pain management as above   4. Hypertension-on Maxzide  5. Depression-Zoloft  6. DVT prophylaxis- Hold Lovenox for procedure tomorrow  7. Thombocytopenia - unknown etiology, no signs of bleeding or infection - Continue to monitor   All the records are reviewed and case discussed with Care Management/Social Workerr. Management plans discussed with the patient, family and they are in agreement.  CODE STATUS: Full code  TOTAL TIME TAKING CARE OF THIS PATIENT: 36 minutes.   POSSIBLE D/C IN 2-3 DAYS, DEPENDING ON CLINICAL CONDITION.   Loraine LericheElizabeth A Samanthamarie Ezzell  DNP on 11/24/2018 at 10:57 AM  Between 7am to 6pm - Pager - [phone]  After 6pm go to [email or web address] - Social research officer, governmentpassword EPAS [facility]  [facility] [place] [facility]  Office  [phone]  CC: Primary care physician; Patrice ParadiseMcLaughlin, Miriam K, MD

## 2018-11-24 NOTE — Progress Notes (Signed)
MRI reviewed and shows new L3 and L4 compression fractures.  This is limiting her ability to get out of bed.  Her pulmonary status seems to improved during her hospitalization and she could have kyphoplasty with minimal sedation tomorrow.

## 2018-11-24 NOTE — Care Management Important Message (Signed)
Important Message  Patient Details  Name: Sarah Mckee MRN: [id number] Date of Birth: 1946/03/06   Medicare Important Message Given:  Yes     Juliann Pulse A Lakai Moree 11/24/2018, 11:19 AM

## 2018-11-24 NOTE — Progress Notes (Signed)
Physical Therapy Treatment Patient Details Name: Sarah Mckee MRN: [id number] DOB: [birth year] Today's Date: 11/24/2018    History of Present Illness 73 year old female with chronic respiratory failure secondary to COPD on 3 L home oxygen, hypertension, lymphedema, pulmonary hypertension presents to hospital after mechanical fall and right hip pain. Pt s/p R IM nail on 7/2 ([facility])    PT Comments    Pt stating she would not be able to participate today due to pain.  Offered supine exercises and she agreed.  Pt was only able to tolerate minimal ankle exercises on BLE and began wimpering and crying out with attempts at either LE hip/knee movements.  She also complained of nausea at this time and RN was notified of request for nausea medication.  Kyphoplasty planned for tomorrow per chart review.   Follow Up Recommendations  SNF     Equipment Recommendations  Rolling walker with 5" wheels;3in1 (PT);Wheelchair (measurements PT);Wheelchair cushion (measurements PT)    Recommendations for Other Services       Precautions / Restrictions Precautions Precautions: Fall Precaution Comments: skin tears and bruises easily Restrictions Weight Bearing Restrictions: Yes RLE Weight Bearing: Weight bearing as tolerated    Mobility  Bed Mobility                  Transfers                    Ambulation/Gait                 Stairs             Wheelchair Mobility    Modified Rankin (Stroke Patients Only)       Balance                                            Cognition Arousal/Alertness: Awake/alert Behavior During Therapy: WFL for tasks assessed/performed Overall Cognitive Status: Within Functional Limits for tasks assessed                                        Exercises Other Exercises Other Exercises: attempted supine ex but only able to tolerate minimal movements.    General Comments        Pertinent  Vitals/Pain Pain Assessment: 0-10 Pain Score: 10-Worst pain ever Pain Location: R hip and back  Pain Descriptors / Indicators: Constant;Grimacing;Guarding;Moaning;Sharp;Other (Comment);Crying Pain Intervention(s): Limited activity within patient's tolerance;Monitored during session    Home Living                      Prior Function            PT Goals (current goals can now be found in the care plan section) Progress towards PT goals: Not progressing toward goals - comment    Frequency    7X/week      PT Plan Current plan remains appropriate    Co-evaluation              AM-PAC PT "6 Clicks" Mobility   Outcome Measure  Help needed turning from your back to your side while in a flat bed without using bedrails?: Total Help needed moving from lying on your back to sitting on the side of a flat bed without using bedrails?: Total Help  needed moving to and from a bed to a chair (including a wheelchair)?: Total Help needed standing up from a chair using your arms (e.g., wheelchair or bedside chair)?: Total Help needed to walk in hospital room?: Total Help needed climbing 3-5 steps with a railing? : Total 6 Click Score: 6    End of Session Equipment Utilized During Treatment: Oxygen Activity Tolerance: Patient limited by pain Patient left: in bed;with call bell/phone within reach;with bed alarm set Nurse Communication: Other (comment) Pain - Right/Left: Right Pain - part of body: Hip     Time: [id number] PT Time Calculation (min) (ACUTE ONLY): 8 min  Charges:  $Therapeutic Exercise: 8-22 mins                    Danielle DessSarah Lenny Bouchillon, PTA 11/24/18, 1:25 PM

## 2018-11-24 NOTE — TOC Progression Note (Signed)
Transition of Care [facility]) - Progression Note    Patient Details  Name: Valaree Fresquez MRN: [id number] Date of Birth: 08/25/1945  Transition of Care [facility] CM/SW Contact  Su Hilt, RN Phone Number: 11/24/2018, 9:13 AM  Clinical Narrative:    Called and spoke to [place] at [facility], I notified her that the patient will be having a Kypho tomorrow for a compression fracture and will not DC til possibly Friday, she stated the Josem Kaufmann is good for 5 days and as long as she discharges by Monday at 5 PM no need to redo the auth request   Expected Discharge Plan: [place] Barriers to Discharge: Barriers Resolved  Expected Discharge Plan and Services Expected Discharge Plan: [place]   Discharge Planning Services: CM Consult Post Acute Care Choice: Newborn arrangements for the past 2 months: Single Family Home                 DME Arranged: N/A         HH Arranged: PT HH Agency: [employer] ([place]) Date HH Agency Contacted: 11/19/18 Time [place]: 1206 Representative spoke with at [place]: [place] ([place]) Interventions    Readmission Risk Interventions No flowsheet data found.

## 2018-11-25 ENCOUNTER — Other Ambulatory Visit: Payer: Self-pay

## 2018-11-25 ENCOUNTER — Inpatient Hospital Stay: Payer: PPO

## 2018-11-25 ENCOUNTER — Encounter
Admission: EM | Disposition: A | Discharge status: Home Health | Payer: Self-pay | Source: Home / Self Care | Attending: Nurse Practitioner

## 2018-11-25 ENCOUNTER — Ambulatory Visit: Admission: RE | Admit: 2018-11-25 | Payer: PPO | Source: Home / Self Care | Admitting: Orthopedic Surgery

## 2018-11-25 ENCOUNTER — Inpatient Hospital Stay: Payer: PPO | Admitting: Certified Registered"

## 2018-11-25 ENCOUNTER — Encounter: Payer: Self-pay | Admitting: Anesthesiology

## 2018-11-25 HISTORY — PX: KYPHOPLASTY: SHX5884

## 2018-11-25 LAB — CBC
HCT: 30.1 % — ABNORMAL LOW (ref 36.0–46.0)
Hemoglobin: 9.4 g/dL — ABNORMAL LOW (ref 12.0–15.0)
MCH: 31.1 pg (ref 26.0–34.0)
MCHC: 31.2 g/dL (ref 30.0–36.0)
MCV: 99.7 fL (ref 80.0–100.0)
Platelets: 149 10*3/uL — ABNORMAL LOW (ref 150–400)
RBC: 3.02 MIL/uL — ABNORMAL LOW (ref 3.87–5.11)
RDW: 14.1 % (ref 11.5–15.5)
WBC: 8.3 10*3/uL (ref 4.0–10.5)
nRBC: 0 % (ref 0.0–0.2)

## 2018-11-25 LAB — CREATININE, SERUM
Creatinine, Ser: 0.44 mg/dL (ref 0.44–1.00)
GFR calc Af Amer: >60 mL/min (ref >60)
GFR calc non Af Amer: >60 mL/min (ref >60)

## 2018-11-25 SURGERY — KYPHOPLASTY
Anesthesia: General | Site: Back

## 2018-11-25 MED ORDER — ONDANSETRON HCL 4 MG/2ML IJ SOLN
4.0000 mg | Freq: Once | INTRAMUSCULAR | Status: AC | PRN
  Administered 2018-11-25: 4 mg via INTRAVENOUS

## 2018-11-25 MED ORDER — ENOXAPARIN SODIUM 40 MG/0.4ML ~~LOC~~ SOLN
40.0000 mg | SUBCUTANEOUS | Status: DC
  Administered 2018-11-26: 40 mg via SUBCUTANEOUS
  Filled 2018-11-25: qty 0.4

## 2018-11-25 MED ORDER — METOCLOPRAMIDE HCL 5 MG/ML IJ SOLN: 5.0000 mg | Freq: Three times a day (TID) | INTRAMUSCULAR | Status: DC | PRN

## 2018-11-25 MED ORDER — KETAMINE HCL 10 MG/ML IJ SOLN
INTRAMUSCULAR | Status: DC | PRN
  Administered 2018-11-25 (×2): 10 mg via INTRAVENOUS

## 2018-11-25 MED ORDER — PROPOFOL 500 MG/50ML IV EMUL: INTRAVENOUS | Status: DC | PRN

## 2018-11-25 MED ORDER — FENTANYL CITRATE (PF) 100 MCG/2ML IJ SOLN: 25.0000 ug | INTRAMUSCULAR | Status: DC | PRN

## 2018-11-25 MED ORDER — LIDOCAINE HCL 1 % IJ SOLN
INTRAMUSCULAR | Status: DC | PRN
  Administered 2018-11-25: 20 mL

## 2018-11-25 MED ORDER — METOCLOPRAMIDE HCL 10 MG PO TABS: 5.0000 mg | ORAL_TABLET | Freq: Three times a day (TID) | ORAL | Status: DC | PRN

## 2018-11-25 MED ORDER — IOHEXOL 180 MG/ML  SOLN
INTRAMUSCULAR | Status: DC | PRN
  Administered 2018-11-25: 20 mL

## 2018-11-25 MED ORDER — CLINDAMYCIN PHOSPHATE 600 MG/50ML IV SOLN
INTRAVENOUS | Status: AC
  Filled 2018-11-25: qty 50

## 2018-11-25 MED ORDER — DOCUSATE SODIUM 100 MG PO CAPS
100.0000 mg | ORAL_CAPSULE | Freq: Two times a day (BID) | ORAL | Status: DC
  Administered 2018-11-25 – 2018-11-26 (×2): 100 mg via ORAL
  Filled 2018-11-25 (×2): qty 1

## 2018-11-25 MED ORDER — ENOXAPARIN SODIUM 30 MG/0.3ML ~~LOC~~ SOLN: 30.0000 mg | SUBCUTANEOUS | Status: DC

## 2018-11-25 MED ORDER — ONDANSETRON HCL 4 MG/2ML IJ SOLN
INTRAMUSCULAR | Status: AC
  Filled 2018-11-25: qty 2

## 2018-11-25 MED ORDER — BUPIVACAINE-EPINEPHRINE (PF) 0.5% -1:200000 IJ SOLN
INTRAMUSCULAR | Status: DC | PRN
  Administered 2018-11-25: 20 mL

## 2018-11-25 SURGICAL SUPPLY — 20 items

## 2018-11-25 NOTE — Progress Notes (Signed)
[place] at [place] NAME: Sarah Mckee    MR#:  [id number]  DATE OF BIRTH:  05-31-1945  SUBJECTIVE:  CHIEF COMPLAINT:   Chief Complaint  Patient presents with   Fall   Hip Pain   Patient is seen at the bedside.   S/p right hip surgery POD # 6.  COPD on chronic home oxygen  s/p kyphoplasty today.  REVIEW OF SYSTEMS:  Review of Systems  Constitutional: Positive for malaise/fatigue. Negative for chills and fever.  HENT: Negative for congestion, ear discharge, hearing loss and nosebleeds.   Eyes: Negative for blurred vision and double vision.  Respiratory: Positive for shortness of breath and wheezing. Negative for cough.   Cardiovascular: Negative for chest pain and palpitations.  Gastrointestinal: Negative for abdominal pain, constipation, diarrhea, nausea and vomiting.  Genitourinary: Negative for dysuria.  Musculoskeletal: Positive for back pain, joint pain and myalgias.  Neurological: Positive for weakness. Negative for dizziness, speech change, focal weakness, seizures and headaches.  Psychiatric/Behavioral: Negative for depression.   DRUG ALLERGIES:   Allergies  Allergen Reactions   Codeine Shortness Of Breath   Demerol [Meperidine] Shortness Of Breath   Morphine And Related Shortness Of Breath   Oxycodone Shortness Of Breath   Alendronate Other (See Comments)    Other reaction(s): Other (See Comments) esophagitis   Amoxicillin-Pot Clavulanate Other (See Comments)    Unknown Has patient had a PCN reaction causing immediate rash, facial/tongue/throat swelling, SOB or lightheadedness with hypotension: Unknown Has patient had a PCN reaction causing severe rash involving mucus membranes or skin necrosis: Unknown Has patient had a PCN reaction that required hospitalization: Unknown Has patient had a PCN reaction occurring within the last 10 years: Unknown If all of the above answers are "NO", then may proceed with  Cephalosporin use.    Risedronate Other (See Comments)    Other reaction(s): Other (See Comments) epigastric pain   Shellfish Allergy Nausea And Vomiting    VITALS:  Blood pressure (!) 146/87, pulse 97, temperature 98.1 F (36.7 C), resp. rate 18, height 5\' 2"  (1.575 m), weight 87 kg, SpO2 97 %.  PHYSICAL EXAMINATION:  Physical Exam   GENERAL:  73 y.o.-year-old patient lying in the bed with no acute distress.  EYES: Pupils equal, round, reactive to light and accommodation. No scleral icterus. Extraocular muscles intact.  HEENT: Head atraumatic, normocephalic. Oropharynx and nasopharynx clear.  NECK:  Supple, no jugular venous distention. No thyroid enlargement, no tenderness.  LUNGS: Normal breath sounds bilaterally, mild wheezing no rales,rhonchi or crepitation.  Decreased bibasilar breath sounds.  No use of accessory muscles of respiration.  CARDIOVASCULAR: S1, S2 normal. No  rubs, or gallops.  2/6 systolic murmur is present ABDOMEN: Soft, nontender, nondistended. Bowel sounds present. No organomegaly or mass.  EXTREMITIES: 2+ bilateral pedal edema noted.  No  cyanosis, or clubbing.  Right leg is swollen, dressing in place on the right lateral thigh NEUROLOGIC: Cranial nerves II through XII are intact. Muscle strength 4/5 in all extremities except right lower extremity due to pain. Sensation intact. Gait not checked.  PSYCHIATRIC: The patient is alert and oriented, slightly groggy from pain medication.  SKIN: No obvious rash, lesion, or ulcer.   LABORATORY PANEL:   CBC Recent Labs  Lab 11/25/18 0500  WBC 8.3  HGB 9.4*  HCT 30.1*  PLT 149*   ------------------------------------------------------------------------------------------------------------------  Chemistries  Recent Labs  Lab 11/22/18 0427 11/25/18 0448  NA 140  --   K 4.0  --  CL 95*  --   CO2 39*  --   GLUCOSE 110*  --   BUN 17  --   CREATININE 0.39* 0.44  CALCIUM 8.1*  --     ------------------------------------------------------------------------------------------------------------------  Cardiac Enzymes No results for input(s): TROPONINI in the last 168 hours. ------------------------------------------------------------------------------------------------------------------  RADIOLOGY:  Dg Lumbar Spine 2-3 Views  Result Date: 11/25/2018 CLINICAL DATA:  Benign compression fractures of L3 and L4. Kyphoplasties. EXAM: LUMBAR SPINE - 2-3 VIEW; DG C-ARM 61-120 MIN COMPARISON:  Lumbar MRI dated 11/23/2018 FINDINGS: AP and lateral C-arm images demonstrate and at kyphoplasty has been performed at L3 and L4. IMPRESSION: Kyphoplasty at L3 and L4. FLUOROSCOPY TIME:  2 minutes 3 seconds C-arm fluoroscopic images were obtained intraoperatively and submitted for post operative interpretation. Electronically Signed   By: Francene BoyersJames  Maxwell M.D.   On: 11/25/2018 10:14   Mr Lumbar Spine Wo Contrast  Result Date: 11/23/2018 CLINICAL DATA:  [place].  Back pain and hip pain. EXAM: MRI LUMBAR SPINE WITHOUT CONTRAST TECHNIQUE: Multiplanar, multisequence MR imaging of the lumbar spine was performed. No intravenous contrast was administered. COMPARISON:  CT scan 04/01/2016 FINDINGS: Segmentation: There are five lumbar type vertebral bodies. The last full intervertebral disc space is labeled L5-S1. This correlates with the prior CT scan. Alignment:  Normal Vertebrae: There are acute or subacute fractures of L3 and L4. Minimal posterior compression deformity of L3. No significant compression deformity of L4. The L3 fracture extends through the midportion of the vertebrae and the L4 fracture is more superior. There is very slight buckling of the posterior cortex of L3 with minimal retropulsion. No canal compromise. Remote compression fracture of L1 and L2. Conus medullaris and cauda equina: Conus extends to the bottom of L1 level. Conus and cauda equina appear normal. Paraspinal and other soft tissues: No  significant paraspinal or retroperitoneal findings. There is a moderate-sized left renal cyst noted. Disc levels: T12-L1: Remote compression fracture of L1 with minimal retropulsion but no canal compromise. No disc protrusions or foraminal stenosis. L1-2: Mild bulging annulus and slight flattening of the ventral thecal sac but no significant spinal or foraminal stenosis. L2-3: No significant findings.  Mild facet disease. L3-4: Mild annular bulge with slight flattening of the ventral thecal sac but no significant spinal or foraminal stenosis. L4-5: Bulging annulus and moderate facet disease with mild bilateral lateral recess encroachment. No spinal or foraminal stenosis. L5-S1: Moderate facet disease but no disc protrusions, spinal or foraminal stenosis. IMPRESSION: 1. Mild acute or subacute compression fractures of L3 and L4 as detailed above. No significant retropulsion or canal compromise. 2. Remote L1 and L2 compression fractures. 3. No significant disc protrusions, spinal or foraminal stenosis in the lumbar spine. Electronically Signed   By: Rudie MeyerP.  Gallerani M.D.   On: 11/23/2018 16:53   Dg C-arm 1-60 Min  Result Date: 11/25/2018 CLINICAL DATA:  Benign compression fractures of L3 and L4. Kyphoplasties. EXAM: LUMBAR SPINE - 2-3 VIEW; DG C-ARM 61-120 MIN COMPARISON:  Lumbar MRI dated 11/23/2018 FINDINGS: AP and lateral C-arm images demonstrate and at kyphoplasty has been performed at L3 and L4. IMPRESSION: Kyphoplasty at L3 and L4. FLUOROSCOPY TIME:  2 minutes 3 seconds C-arm fluoroscopic images were obtained intraoperatively and submitted for post operative interpretation. Electronically Signed   By: Francene BoyersJames  Maxwell M.D.   On: 11/25/2018 10:14    EKG:   Orders placed or performed during the hospital encounter of 11/17/18   ED EKG   ED EKG   EKG 12-Lead  EKG 12-Lead   ASSESSMENT AND PLAN:   73 year old female with chronic respiratory failure secondary to COPD on 3 L home oxygen, hypertension,  lymphedema, compression fx of lower back, pulmonary hypertension and chronic bilateral femoral head avascular necrosis  presents to hospital after mechanical fall and right hip pain.  1.  Right hip intertrochanteric fracture-secondary to mechanical fall - Status post ORIF, POD # 6 - Continue pain control with Dilaudid - PT/OT following current plan is discharge to SNF [facility] - [facility] 6 weeks for repeat x-rays of the right femur - Ortho input appreciated  2.  Chronic respiratory failure-secondary to COPD.  On 3 L home oxygen all the time - No acute exacerbation identified - Continue inhalers, nebulizers.  - Lasix PRN  3. Chronic compression fx of vertebral - worsening pain since fall. Hx of osteoporosis s/p Fosamax and Actonel -  Lumbar verterbral x-ray shows old fxs and interval mild compression of indeterminate age.  - Follow up MRI lumbar shows acute/subacute compression fractures of L3-L4 - Continue pain management as above - Kyphoplasty 11/25/18.  4. Hypertension-on Maxzide  5. Depression-Zoloft  6. DVT prophylaxis- Hold Lovenox for procedure tomorrow  7. Thombocytopenia - unknown etiology, no signs of bleeding or infection - Continue to monitor   All the records are reviewed and case discussed with Care Management/Social Workerr. Management plans discussed with the patient, family and they are in agreement.  CODE STATUS: Full code  TOTAL TIME TAKING CARE OF THIS PATIENT: 36 minutes.   POSSIBLE D/C IN 2-3 DAYS, DEPENDING ON CLINICAL CONDITION.   Altamese DillingVaibhavkumar Cleotha Tsang  DNP on 11/25/2018 at 1:38 PM  Between 7am to 6pm - Pager - [phone]  After 6pm go to [email or web address] - Social research officer, governmentpassword EPAS [facility]  [facility] [place] [facility]  Office  [phone]  CC: Primary care physician; Patrice ParadiseMcLaughlin, Miriam K, MD

## 2018-11-25 NOTE — Anesthesia Preprocedure Evaluation (Addendum)
Anesthesia Evaluation  Patient identified by MRN, date of birth, ID band Patient awake    Reviewed: Allergy & Precautions, NPO status , Patient's Chart, lab work & pertinent test results  Airway Mallampati: II       Dental  (+) Teeth Intact   Pulmonary shortness of breath and with exertion, COPD,  COPD inhaler and oxygen dependent, former smoker,     + decreased breath sounds      Cardiovascular Exercise Tolerance: Poor hypertension, Pt. on medications  Rhythm:Regular Rate:Normal     Neuro/Psych negative neurological ROS  negative psych ROS   GI/Hepatic negative GI ROS, Neg liver ROS,   Endo/Other  negative endocrine ROS  Renal/GU negative Renal ROS     Musculoskeletal  (+) Arthritis , Osteoarthritis,    Abdominal Normal abdominal exam  (+)   Peds negative pediatric ROS (+)  Hematology negative hematology ROS (+)   Anesthesia Other Findings Past Medical History: No date: Arthritis No date: COPD (chronic obstructive pulmonary disease) (HCC)     Comment:  2L 02 at baseline No date: Hypertension No date: Lymphedema of left leg No date: Lymphedema of right lower extremity No date: Pulmonary HTN (HCC)  Reproductive/Obstetrics                             Anesthesia Physical  Anesthesia Plan  ASA: III  Anesthesia Plan: General   Post-op Pain Management:    Induction: Intravenous  PONV Risk Score and Plan: 0 and TIVA  Airway Management Planned: Nasal Cannula  Additional Equipment:   Intra-op Plan:   Post-operative Plan:   Informed Consent: I have reviewed the patients History and Physical, chart, labs and discussed the procedure including the risks, benefits and alternatives for the proposed anesthesia with the patient or authorized representative who has indicated his/her understanding and acceptance.       Plan Discussed with: CRNA and Surgeon  Anesthesia Plan  Comments: ( Talked the case over with the patient's husband.  He understands the risks and agrees to the procedure.)       Anesthesia Quick Evaluation

## 2018-11-25 NOTE — Progress Notes (Signed)
Patient with painful L3 and L4 compression fractures.  Planned kyphoplasty today.

## 2018-11-25 NOTE — Anesthesia Postprocedure Evaluation (Signed)
Anesthesia Post Note  Patient: Sarah Mckee  Procedure(s) Performed: KYPHOPLASTY L3-L4 (N/A Back)  Patient location during evaluation: PACU Anesthesia Type: General Level of consciousness: awake and alert and oriented Pain management: pain level controlled Vital Signs Assessment: post-procedure vital signs reviewed and stable Respiratory status: spontaneous breathing Cardiovascular status: blood pressure returned to baseline Anesthetic complications: no     Last Vitals:  Vitals:   11/25/18 1146 11/25/18 1313  BP: 128/62 (!) 146/87  Pulse: 89 97  Resp: 17 18  Temp: 37 C 36.7 C  SpO2:  97%    Last Pain:  Vitals:   11/25/18 1428  TempSrc:   PainSc: Asleep                 Arlissa Monteverde

## 2018-11-25 NOTE — Progress Notes (Signed)
PHARMACIST - PHYSICIAN COMMUNICATION  CONCERNING:  Enoxaparin (Lovenox) for DVT Prophylaxis    RECOMMENDATION: Patient was prescribed enoxaprin 30mg  q24 hours for VTE prophylaxis.   Filed Weights   11/17/18 2231 11/18/18 0409 11/25/18 0913  Weight: 183 lb (83 kg) 192 lb (87.1 kg) 191 lb 12.8 oz (87 kg)    Body mass index is 35.08 kg/m.  Estimated Creatinine Clearance: 65.1 mL/min (by C-G formula based on SCr of 0.44 mg/dL).   Patient is candidate for enoxaparin 40mg  every 24 hours based on CrCl >47ml/min AND Weight > 45kg for female   DESCRIPTION: Pharmacy has adjusted enoxaparin dose per [facility] policy.  Patient is now receiving enoxaparin 40mg  every 24 hours.    Lu Duffel, PharmD, BCPS Clinical Pharmacist 11/25/2018 2:46 PM

## 2018-11-25 NOTE — OR Nursing (Signed)
After turning patient from prone posistion to supine once case was over. A skin tear was noted to middle of chest. Cleansed with NS and approximated edges,telfa and tegaderm applied. MD in room and aware and notified.

## 2018-11-25 NOTE — Progress Notes (Signed)
Please note, patient is currently followed by outpatient Palliative at home. CMRN Deliliah Belenda Cruise made aware.  Flo Shanks BSN, RN, [facility] [place] [place] [phone]

## 2018-11-25 NOTE — Progress Notes (Signed)
Physical Therapy Treatment Patient Details Name: Sarah Mckee MRN: [id number] DOB: 1946-04-10 Today's Date: 11/25/2018    History of Present Illness [age] year old female with chronic respiratory failure secondary to COPD on 3 L home oxygen, hypertension, lymphedema, pulmonary hypertension presents to hospital after mechanical fall and right hip pain. Pt s/p R IM nail on 7/2 (WBAT)    PT Comments    Kyphoplasty this am.  Initially declines session but agreed to supine AAROM.  Pt externally rotated R hip.  Gently ROM IR/ER to reposition hip and propped with towel roll to maintain neutral.  She is able to do ankle pumps and quad sets but only tolerates minimal hip/knee flexion and ab/add.  Improved ability to tolerate LLE tx today with dec pain.  She is generally lethargic this session but does put in good effort as she is able.   Follow Up Recommendations  SNF     Equipment Recommendations  Rolling walker with 5" wheels;3in1 (PT);Wheelchair (measurements PT);Wheelchair cushion (measurements PT)    Recommendations for Other Services       Precautions / Restrictions Precautions Precautions: Fall Precaution Comments: skin tears and bruises easily Restrictions Weight Bearing Restrictions: Yes RLE Weight Bearing: Weight bearing as tolerated    Mobility  Bed Mobility               General bed mobility comments: Refused  Transfers                 General transfer comment: Unable to attempt  Ambulation/Gait             General Gait Details: unsafe/unable   Stairs             Wheelchair Mobility    Modified Rankin (Stroke Patients Only)       Balance                                            Cognition Arousal/Alertness: Awake/alert Behavior During Therapy: WFL for tasks assessed/performed Overall Cognitive Status: Within Functional Limits for tasks assessed                                        Exercises  Other Exercises Other Exercises: RLE ankle pumps and quad sets.  Minimal hip IR/ER to improve position of LE.  Tolerated minimal hip/knee flexion or ab/add Other Exercises: LLE ankle pumps, quad sets, heel slides, SLR  and Ab/add x 10    General Comments        Pertinent Vitals/Pain Pain Assessment: Faces Faces Pain Scale: Hurts whole lot Pain Location: R hip and back  Pain Descriptors / Indicators: Constant;Grimacing;Guarding;Moaning;Sharp;Other (Comment);Crying Pain Intervention(s): Limited activity within patient's tolerance;Monitored during session    Home Living                      Prior Function            PT Goals (current goals can now be found in the care plan section) Progress towards PT goals: Progressing toward goals    Frequency    7X/week      PT Plan Current plan remains appropriate    Co-evaluation              AM-PAC PT "6 Clicks" Mobility  Outcome Measure  Help needed turning from your back to your side while in a flat bed without using bedrails?: Total Help needed moving from lying on your back to sitting on the side of a flat bed without using bedrails?: Total Help needed moving to and from a bed to a chair (including a wheelchair)?: Total Help needed standing up from a chair using your arms (e.g., wheelchair or bedside chair)?: Total Help needed to walk in hospital room?: Total Help needed climbing 3-5 steps with a railing? : Total 6 Click Score: 6    End of Session Equipment Utilized During Treatment: Oxygen Activity Tolerance: Patient tolerated treatment well;Patient limited by fatigue;Patient limited by lethargy Patient left: in bed;with call bell/phone within reach;with bed alarm set   Pain - Right/Left: Right Pain - part of body: Hip     Time: 1324-4010 PT Time Calculation (min) (ACUTE ONLY): 13 min  Charges:  $Therapeutic Exercise: 8-22 mins                     Chesley Noon, PTA 11/25/18, 2:20 PM

## 2018-11-25 NOTE — Transfer of Care (Signed)
Immediate Anesthesia Transfer of Care Note  Patient: Sarah Mckee  Procedure(s) Performed: KYPHOPLASTY L3-L4 (N/A Back)  Patient Location: PACU  Anesthesia Type:MAC  Level of Consciousness: awake and alert   Airway & Oxygen Therapy: Patient Spontanous Breathing and Patient connected to face mask oxygen  Post-op Assessment: Report given to RN and Post -op Vital signs reviewed and stable  Post vital signs: Reviewed and stable  Last Vitals:  Vitals Value Taken Time  BP    Temp    Pulse    Resp    SpO2      Last Pain:  Vitals:   11/25/18 0913  TempSrc: Temporal  PainSc: 9       Patients Stated Pain Goal: 1 (45/40/98 1191)  Complications: No apparent anesthesia complications

## 2018-11-25 NOTE — Anesthesia Post-op Follow-up Note (Signed)
Anesthesia QCDR form completed.        

## 2018-11-25 NOTE — Op Note (Signed)
Date November 25, 2018  time 10:02 AM   PATIENT:  Sarah Mckee   PRE-OPERATIVE DIAGNOSIS:  closed wedge compression fracture of L3 and L4   POST-OPERATIVE DIAGNOSIS:  closed wedge compression fracture of L3 and L4   PROCEDURE:  Procedure(s): KYPHOPLASTY L3 and L4  SURGEON: Laurene Footman, MD   ASSISTANTS: None   ANESTHESIA:   local and MAC   EBL:  No intake/output data recorded.   BLOOD ADMINISTERED:none   DRAINS: none    LOCAL MEDICATIONS USED:  MARCAINE    and XYLOCAINE    SPECIMEN:   None   DISPOSITION OF SPECIMEN:  Not applicable   COUNTS:  YES   TOURNIQUET:  * No tourniquets in log *   IMPLANTS: Bone cement   DICTATION: .Dragon Dictation  patient was brought to the operating room and after adequate anesthesia was obtained the patient was placed prone.  C arm was brought in in good visualization of the affected level obtained on both AP and lateral projections.  After patient identification and timeout procedures were completed, local anesthetic was infiltrated with 10 cc 1% Xylocaine infiltrated subcutaneously.  This is done the area on the right side of the planned approach.  The back was then prepped and draped in the usual sterile manner and repeat timeout procedure carried out.  A spinal needle was brought down to the pedicle on the right side of  L3 and L4 and a 50-50 mix of 1% Xylocaine half percent Sensorcaine with epinephrine total of 20 cc injected.  After allowing this to set a small incision was made and the trocar was advanced into the vertebral body in an extrapedicular fashion.  Biopsy was not obtained at either level Drilling was carried out balloon inserted with inflation to  3 cc at L4 and 4 cc at L3.  When the cement was appropriate consistency 5 cc were injected into the vertebral body at L3 and 4 at L for without extravasation, good fill superior to inferior endplates and from right to left sides along the inferior endplate.  After the cement had set the  trochar was removed and permanent C-arm views obtained.  The wound was closed with Dermabond followed by Band-Aid   PLAN OF CARE:  Continue as inpatient   PATIENT DISPOSITION:  PACU - hemodynamically stable.

## 2018-11-25 NOTE — OR Nursing (Signed)
Patient arrived in preop very sleepy.  Not willing to open her eyes but can respond with words or by shaking her head.  States pain is everywhere. Bruising and scabbed areas on both arms and hands.  Jewelry on right hand ring finger remained on but taped.  Patient denies allergy to betadine.

## 2018-11-25 NOTE — Progress Notes (Signed)
OT Cancellation Note  Patient Details Name: Sarah Mckee MRN: [id number] DOB: Dec 08, 1945   Cancelled Treatment:    Reason Eval/Treat Not Completed: Other (comment). Chart reviewed. Pt noted to be s/p KYPHOPLASTY L3 and L4 today, 11/25/18. Continue at transfer in place. Will hold today and plan to see pt next date for OT re-evaluation/treatment as appropriate.   Jeni Salles, MPH, MS, OTR/L ascom [phone] 11/25/18, 11:48 AM

## 2018-11-26 ENCOUNTER — Encounter: Payer: Self-pay | Admitting: Orthopedic Surgery

## 2018-11-26 DIAGNOSIS — F419 Anxiety disorder, unspecified: Secondary | ICD-10-CM | POA: Diagnosis not present

## 2018-11-26 DIAGNOSIS — Z7982 Long term (current) use of aspirin: Secondary | ICD-10-CM | POA: Diagnosis not present

## 2018-11-26 DIAGNOSIS — J9602 Acute respiratory failure with hypercapnia: Secondary | ICD-10-CM | POA: Diagnosis not present

## 2018-11-26 DIAGNOSIS — R0902 Hypoxemia: Secondary | ICD-10-CM | POA: Diagnosis not present

## 2018-11-26 DIAGNOSIS — A0472 Enterocolitis due to Clostridium difficile, not specified as recurrent: Secondary | ICD-10-CM | POA: Diagnosis not present

## 2018-11-26 DIAGNOSIS — Z9981 Dependence on supplemental oxygen: Secondary | ICD-10-CM | POA: Diagnosis not present

## 2018-11-26 DIAGNOSIS — R0689 Other abnormalities of breathing: Secondary | ICD-10-CM | POA: Diagnosis not present

## 2018-11-26 DIAGNOSIS — J9811 Atelectasis: Secondary | ICD-10-CM | POA: Diagnosis not present

## 2018-11-26 DIAGNOSIS — B342 Coronavirus infection, unspecified: Secondary | ICD-10-CM | POA: Diagnosis not present

## 2018-11-26 DIAGNOSIS — R4020 Unspecified coma: Secondary | ICD-10-CM | POA: Diagnosis not present

## 2018-11-26 DIAGNOSIS — D696 Thrombocytopenia, unspecified: Secondary | ICD-10-CM | POA: Diagnosis not present

## 2018-11-26 DIAGNOSIS — Z8659 Personal history of other mental and behavioral disorders: Secondary | ICD-10-CM | POA: Diagnosis not present

## 2018-11-26 DIAGNOSIS — Z20828 Contact with and (suspected) exposure to other viral communicable diseases: Secondary | ICD-10-CM | POA: Diagnosis not present

## 2018-11-26 DIAGNOSIS — H04123 Dry eye syndrome of bilateral lacrimal glands: Secondary | ICD-10-CM | POA: Diagnosis not present

## 2018-11-26 DIAGNOSIS — D559 Anemia due to enzyme disorder, unspecified: Secondary | ICD-10-CM | POA: Diagnosis not present

## 2018-11-26 DIAGNOSIS — M2550 Pain in unspecified joint: Secondary | ICD-10-CM | POA: Diagnosis not present

## 2018-11-26 DIAGNOSIS — Z66 Do not resuscitate: Secondary | ICD-10-CM | POA: Diagnosis not present

## 2018-11-26 DIAGNOSIS — Z7401 Bed confinement status: Secondary | ICD-10-CM | POA: Diagnosis not present

## 2018-11-26 DIAGNOSIS — F064 Anxiety disorder due to known physiological condition: Secondary | ICD-10-CM | POA: Diagnosis not present

## 2018-11-26 DIAGNOSIS — R52 Pain, unspecified: Secondary | ICD-10-CM | POA: Diagnosis not present

## 2018-11-26 DIAGNOSIS — Z87891 Personal history of nicotine dependence: Secondary | ICD-10-CM | POA: Diagnosis not present

## 2018-11-26 DIAGNOSIS — S72001A Fracture of unspecified part of neck of right femur, initial encounter for closed fracture: Secondary | ICD-10-CM | POA: Diagnosis not present

## 2018-11-26 DIAGNOSIS — J9601 Acute respiratory failure with hypoxia: Secondary | ICD-10-CM | POA: Diagnosis not present

## 2018-11-26 DIAGNOSIS — Z79899 Other long term (current) drug therapy: Secondary | ICD-10-CM | POA: Diagnosis not present

## 2018-11-26 DIAGNOSIS — J439 Emphysema, unspecified: Secondary | ICD-10-CM | POA: Diagnosis not present

## 2018-11-26 DIAGNOSIS — Z885 Allergy status to narcotic agent status: Secondary | ICD-10-CM | POA: Diagnosis not present

## 2018-11-26 DIAGNOSIS — I890 Lymphedema, not elsewhere classified: Secondary | ICD-10-CM | POA: Diagnosis not present

## 2018-11-26 DIAGNOSIS — N390 Urinary tract infection, site not specified: Secondary | ICD-10-CM | POA: Diagnosis not present

## 2018-11-26 DIAGNOSIS — B379 Candidiasis, unspecified: Secondary | ICD-10-CM | POA: Diagnosis not present

## 2018-11-26 DIAGNOSIS — Z4789 Encounter for other orthopedic aftercare: Secondary | ICD-10-CM | POA: Diagnosis not present

## 2018-11-26 DIAGNOSIS — Z880 Allergy status to penicillin: Secondary | ICD-10-CM | POA: Diagnosis not present

## 2018-11-26 DIAGNOSIS — J441 Chronic obstructive pulmonary disease with (acute) exacerbation: Secondary | ICD-10-CM | POA: Diagnosis not present

## 2018-11-26 DIAGNOSIS — Z8719 Personal history of other diseases of the digestive system: Secondary | ICD-10-CM | POA: Diagnosis not present

## 2018-11-26 DIAGNOSIS — R0989 Other specified symptoms and signs involving the circulatory and respiratory systems: Secondary | ICD-10-CM | POA: Diagnosis not present

## 2018-11-26 DIAGNOSIS — I493 Ventricular premature depolarization: Secondary | ICD-10-CM | POA: Diagnosis not present

## 2018-11-26 DIAGNOSIS — D649 Anemia, unspecified: Secondary | ICD-10-CM | POA: Diagnosis not present

## 2018-11-26 DIAGNOSIS — M8008XD Age-related osteoporosis with current pathological fracture, vertebra(e), subsequent encounter for fracture with routine healing: Secondary | ICD-10-CM | POA: Diagnosis not present

## 2018-11-26 DIAGNOSIS — J9600 Acute respiratory failure, unspecified whether with hypoxia or hypercapnia: Secondary | ICD-10-CM | POA: Diagnosis present

## 2018-11-26 DIAGNOSIS — E876 Hypokalemia: Secondary | ICD-10-CM | POA: Diagnosis not present

## 2018-11-26 DIAGNOSIS — I272 Pulmonary hypertension, unspecified: Secondary | ICD-10-CM | POA: Diagnosis not present

## 2018-11-26 DIAGNOSIS — Z862 Personal history of diseases of the blood and blood-forming organs and certain disorders involving the immune mechanism: Secondary | ICD-10-CM | POA: Diagnosis not present

## 2018-11-26 DIAGNOSIS — J449 Chronic obstructive pulmonary disease, unspecified: Secondary | ICD-10-CM | POA: Diagnosis not present

## 2018-11-26 DIAGNOSIS — Z888 Allergy status to other drugs, medicaments and biological substances status: Secondary | ICD-10-CM | POA: Diagnosis not present

## 2018-11-26 DIAGNOSIS — I4519 Other right bundle-branch block: Secondary | ICD-10-CM | POA: Diagnosis not present

## 2018-11-26 DIAGNOSIS — W19XXXA Unspecified fall, initial encounter: Secondary | ICD-10-CM | POA: Diagnosis not present

## 2018-11-26 DIAGNOSIS — Z515 Encounter for palliative care: Secondary | ICD-10-CM | POA: Diagnosis not present

## 2018-11-26 DIAGNOSIS — R404 Transient alteration of awareness: Secondary | ICD-10-CM | POA: Diagnosis not present

## 2018-11-26 DIAGNOSIS — F3289 Other specified depressive episodes: Secondary | ICD-10-CM | POA: Diagnosis not present

## 2018-11-26 DIAGNOSIS — M1990 Unspecified osteoarthritis, unspecified site: Secondary | ICD-10-CM | POA: Diagnosis not present

## 2018-11-26 DIAGNOSIS — J180 Bronchopneumonia, unspecified organism: Secondary | ICD-10-CM | POA: Diagnosis not present

## 2018-11-26 DIAGNOSIS — I10 Essential (primary) hypertension: Secondary | ICD-10-CM | POA: Diagnosis not present

## 2018-11-26 DIAGNOSIS — I959 Hypotension, unspecified: Secondary | ICD-10-CM | POA: Diagnosis not present

## 2018-11-26 DIAGNOSIS — R319 Hematuria, unspecified: Secondary | ICD-10-CM | POA: Diagnosis not present

## 2018-11-26 DIAGNOSIS — Z7951 Long term (current) use of inhaled steroids: Secondary | ICD-10-CM | POA: Diagnosis not present

## 2018-11-26 DIAGNOSIS — S72141D Displaced intertrochanteric fracture of right femur, subsequent encounter for closed fracture with routine healing: Secondary | ICD-10-CM | POA: Diagnosis not present

## 2018-11-26 DIAGNOSIS — A419 Sepsis, unspecified organism: Secondary | ICD-10-CM | POA: Diagnosis not present

## 2018-11-26 DIAGNOSIS — W19XXXD Unspecified fall, subsequent encounter: Secondary | ICD-10-CM | POA: Diagnosis not present

## 2018-11-26 DIAGNOSIS — M6281 Muscle weakness (generalized): Secondary | ICD-10-CM | POA: Diagnosis not present

## 2018-11-26 DIAGNOSIS — E569 Vitamin deficiency, unspecified: Secondary | ICD-10-CM | POA: Diagnosis not present

## 2018-11-26 LAB — SARS CORONAVIRUS 2 BY RT PCR (HOSPITAL ORDER, PERFORMED IN ~~LOC~~ HOSPITAL LAB): SARS Coronavirus 2: NEGATIVE

## 2018-11-26 MED ORDER — HYDROMORPHONE HCL 1 MG/ML IJ SOLN: 1.0000 mg | INTRAMUSCULAR | Status: DC | PRN

## 2018-11-26 MED ORDER — POLYETHYLENE GLYCOL 3350 17 G PO PACK: 17.0000 g | PACK | Freq: Every day | ORAL | 0 refills | Status: AC

## 2018-11-26 MED ORDER — METHOCARBAMOL 750 MG PO TABS: 750.0000 mg | ORAL_TABLET | Freq: Three times a day (TID) | ORAL | 0 refills | Status: AC

## 2018-11-26 MED ORDER — HYDROMORPHONE HCL 2 MG PO TABS: 2.0000 mg | ORAL_TABLET | Freq: Four times a day (QID) | ORAL | Status: DC | PRN

## 2018-11-26 MED ORDER — MAGNESIUM CITRATE PO SOLN
1.0000 | Freq: Once | ORAL | Status: DC
  Filled 2018-11-26: qty 296

## 2018-11-26 MED ORDER — ADULT MULTIVITAMIN W/MINERALS CH: 1.0000 | ORAL_TABLET | Freq: Every day | ORAL | Status: DC

## 2018-11-26 MED ORDER — DOCUSATE SODIUM 100 MG PO CAPS: 100.0000 mg | ORAL_CAPSULE | Freq: Two times a day (BID) | ORAL | 0 refills | Status: AC

## 2018-11-26 MED ORDER — FERROUS SULFATE 325 (65 FE) MG PO TABS: 325.0000 mg | ORAL_TABLET | Freq: Two times a day (BID) | ORAL | 3 refills | Status: AC

## 2018-11-26 MED ORDER — HYDROMORPHONE HCL 2 MG PO TABS: 2.0000 mg | ORAL_TABLET | Freq: Four times a day (QID) | ORAL | 0 refills | Status: AC | PRN

## 2018-11-26 MED ORDER — ADULT MULTIVITAMIN W/MINERALS CH: 1.0000 | ORAL_TABLET | Freq: Every day | ORAL | 0 refills | Status: AC

## 2018-11-26 MED ORDER — ENSURE ENLIVE PO LIQD: 237.0000 mL | Freq: Two times a day (BID) | ORAL | Status: DC

## 2018-11-26 MED ORDER — ALPRAZOLAM 0.25 MG PO TABS: 0.2500 mg | ORAL_TABLET | Freq: Two times a day (BID) | ORAL | 0 refills | Status: AC | PRN

## 2018-11-26 MED ORDER — ENSURE ENLIVE PO LIQD: 237.0000 mL | Freq: Two times a day (BID) | ORAL | 12 refills | Status: AC

## 2018-11-26 NOTE — Progress Notes (Signed)
Subjective: 1 Day Post-Op Procedure(s) (LRB): KYPHOPLASTY L3-L4 (N/A) 8 days post op Right hip IM Nail for IT Fracture Patient reports hip pain as mild and back pain as mild. Pain increased with movement. Still complaining of low back pain.  No abdominal pain nausea or vomiting. Plan for discharge to SNF when appropriate.   Objective: Vital signs in last 24 hours: Temp:  [97.8 F (36.6 C)-98.1 F (36.7 C)] 97.8 F (36.6 C) (07/10 0756) Pulse Rate:  [89-97] 90 (07/10 0756) Resp:  [16-18] 18 (07/10 0756) BP: (112-146)/(54-87) 113/54 (07/10 0756) SpO2:  [97 %-100 %] 99 % (07/10 0756)  Intake/Output from previous day:  Intake/Output Summary (Last 24 hours) at 11/26/2018 1220 Last data filed at 11/26/2018 1137 Gross per 24 hour  Intake 898.75 ml  Output 902 ml  Net -3.25 ml    Intake/Output this shift: Total I/O In: 120 [P.O.:120] Out: 402 [Urine:400; Stool:2]  Labs: Recent Labs    11/25/18 0500  HGB 9.4*   Recent Labs    11/25/18 0500  WBC 8.3  RBC 3.02*  HCT 30.1*  PLT 149*   Recent Labs    11/25/18 0448  CREATININE 0.44   No results for input(s): LABPT, INR in the last 72 hours. EXAM General - Patient is Alert, Appropriate and Oriented  Extremity - ABD soft Sensation intact distally Intact pulses distally Dorsiflexion/Plantar flexion intact Incision: dressing C/D/I No cellulitis present Dressing/Incision - clean, dry, no drainage Motor Function - intact, moving foot and toes well on exam.   Past Medical History:  Diagnosis Date  . Arthritis   . COPD (chronic obstructive pulmonary disease) (HCC)    2L 02 at baseline  . Hypertension   . Lymphedema of left leg   . Lymphedema of right lower extremity   . Pulmonary HTN (HCC)     Assessment/Plan: 1 Day Post-Op Procedure(s) (LRB): KYPHOPLASTY L3-L4 (N/A) Active Problems:   Closed right hip fracture (HCC)  Estimated body mass index is 35.08 kg/m as calculated from the following:   Height as of  this encounter: 5\' 2"  (1.575 m).   Weight as of this encounter: 87 kg. Advance diet Up with therapy  Possible new acute lumbar compression fracture.  Age-indeterminate.  History of chronic compression fractures.  We will continue to monitor.  Patient will follow-up in clinic and will consider MRI if persistent pain.  Up with therapy today.  Current plan is for discharge to SNF.  Follow up with [facility] ortho in 2 weeks for repeat xrays of lumbar spine.  Upon discharge from hospital, continue Lovenox 40mg  daily for DVT prevention.  Staples can be removed on 12/01/2018 by rehab facility.    DVT Prophylaxis - Lovenox, TED hose and SCDs Weight-Bearing as tolerated to right leg  Duanne Guess, PA-C [facility] 11/26/2018, 12:20 PM

## 2018-11-26 NOTE — Evaluation (Signed)
Occupational Therapy Evaluation Patient Details Name: Sarah Mckee MRN: [id number] DOB: 07-11-1945 Today's Date: 11/26/2018    History of Present Illness Patient is a pleasant 73 year old female with chronic respiratory failure secondary to COPD on 3 L home oxygen, hypertension, lymphedema, pulmonary hypertension presents to hospital after mechanical fall and right hip pain. Pt s/p R IM nail on 7/2 (WBAT). Kyphoplasty 7/9 L3-4.   Clinical Impression   Pt seen for OT re-evaluation this date, POD#1 from above surgery. Pt was independent in all ADLs prior to surgery, and ambulating with a rollator prior to hospitalization. Pt is eager to return to PLOF with less pain and improved safety and independence. Pt currently requires bed level max +2 assist for LB ADL and toileting, Max +2 for rolling in bed. Limited by pain. Pt instructed in PLB to support pain mgt and recovery after exertional activity.  Pt would benefit from additional instruction in self care skills and techniques to help maintain precautions with or without assistive devices to support recall and carryover prior to discharge. Recommend STR upon discharge.     Follow Up Recommendations  SNF    Equipment Recommendations  3 in 1 bedside commode    Recommendations for Other Services       Precautions / Restrictions Precautions Precautions: Fall Precaution Comments: skin tears and bruises easily Restrictions Weight Bearing Restrictions: Yes RLE Weight Bearing: Weight bearing as tolerated      Mobility Bed Mobility Overal bed mobility: Needs Assistance Bed Mobility: Rolling Rolling: +2 for physical assistance;Max assist         General bed mobility comments: requires assitance to hold position for cleaning, able to assist by holding on/reaching with guidance onto handrails of bed. Sp02 dropped to 88% on 3 L 02 via nasal cannula.  Transfers                 General transfer comment: Unable to attempt at re-eval  due to pain    Balance Overall balance assessment: Needs assistance     Sitting balance - Comments: per previous notes needs assistance however unable to perform at re-eval due to pain.                                   ADL either performed or assessed with clinical judgement   ADL Overall ADL's : Needs assistance/impaired                                       General ADL Comments: Pt req. +2 max assist for LB dressing/bathing 2/2 pain and decreased ROM in the R hip. Fatigues quickly. Is familiar with PLB and ECS, but requires cueing to utilize during functional task. Min/mod assist for UB ADL tasks. Bed level for toileting.     Vision Patient Visual Report: No change from baseline       Perception     Praxis      Pertinent Vitals/Pain Pain Assessment: 0-10 Pain Score: 9  Pain Location: R hip/back Pain Descriptors / Indicators: Constant;Grimacing;Guarding;Moaning;Sharp;Other (Comment);Crying Pain Intervention(s): Limited activity within patient's tolerance;Monitored during session;Repositioned     Hand Dominance Right   Extremity/Trunk Assessment Upper Extremity Assessment Upper Extremity Assessment: Generalized weakness   Lower Extremity Assessment Lower Extremity Assessment: Defer to PT evaluation RLE Deficits / Details: s/p R IM nail: RLE: Unable to fully  assess due to pain RLE Coordination: decreased fine motor;decreased gross motor LLE Deficits / Details: requires assistance for bed mobility, pain in back limit testing LLE: Unable to fully assess due to pain LLE Coordination: decreased gross motor   Cervical / Trunk Assessment Cervical / Trunk Assessment: (s/p L3-4 kyphoplasty)   Communication Communication Communication: No difficulties   Cognition Arousal/Alertness: Awake/alert Behavior During Therapy: WFL for tasks assessed/performed Overall Cognitive Status: Within Functional Limits for tasks assessed                                  General Comments: Patient anxious about movement, tightening of features prior to movement attempts.   General Comments  Noticeable bruising of R distal medial calf and mid chest. Patient reports it is from her kyphoplasty surgery. Noticeable pitting edema in bilateral ankles with excessive plantarflexion in R foot.    Exercises Other Exercises Other Exercises: Patient performed supine bed mobility/rolling requiring Max A x2 for cleanup s/p bowel movement. Patient educated on breathing techniques for pain reduction and to increase SP02 from 88% after rolling. Other Exercises: Gentle df overpressure stretching to R ankle for reduction of plantarflexor positioning with prolonged holds for increased muscle tissue lengthening. AAROM of L and R LE for bed cleanup/donning/doffing equiptment Other Exercises: pt educated and encouraged in using PLB to support pain mgt and recovery after exertional activity   Shoulder Instructions      Home Living Family/patient expects to be discharged to:: Private residence Living Arrangements: Spouse/significant other Available Help at Discharge: Family;Available 24 hours/day Type of Home: House Home Access: Stairs to enter Entergy CorporationEntrance Stairs-Number of Steps: 5 Entrance Stairs-Rails: Left Home Layout: One level               Home Equipment: Emergency planning/management officerhower seat;Walker - 2 wheels;Cane - single point          Prior Functioning/Environment Level of Independence: Needs assistance  Gait / Transfers Assistance Needed: Uses RW for HH distances. SPC for community distances. Pt endorses she does not move much 2/2 fatigue/SOB.  Baseline O2 at 3L ADL's / Homemaking Assistance Needed: Family assists with IADL; Dtr visits weekly to support bathing.   Comments: Does endorse at least 1-2 falls in previous six months        OT Problem List: Decreased strength;Decreased coordination;Cardiopulmonary status limiting activity;Pain;Decreased range of  motion;Increased edema;Decreased activity tolerance;Decreased safety awareness;Decreased knowledge of use of DME or AE;Impaired balance (sitting and/or standing);Decreased knowledge of precautions      OT Treatment/Interventions: Self-care/ADL training;Modalities;Balance training;Therapeutic exercise;Therapeutic activities;Energy conservation;DME and/or AE instruction;Patient/family education    OT Goals(Current goals can be found in the care plan section) Acute Rehab OT Goals Patient Stated Goal: to return to walking OT Goal Formulation: With patient Time For Goal Achievement: 12/10/18 Potential to Achieve Goals: Good  OT Frequency: Min 1X/week   Barriers to D/C: Inaccessible home environment          Co-evaluation PT/OT/SLP Co-Evaluation/Treatment: Yes Reason for Co-Treatment: Complexity of the patient's impairments (multi-system involvement);For patient/therapist safety;To address functional/ADL transfers PT goals addressed during session: Mobility/safety with mobility OT goals addressed during session: ADL's and self-care      AM-PAC OT "6 Clicks" Daily Activity     Outcome Measure Help from another person eating meals?: None Help from another person taking care of personal grooming?: A Little Help from another person toileting, which includes using toliet, bedpan, or urinal?: A Lot Help from  another person bathing (including washing, rinsing, drying)?: A Lot Help from another person to put on and taking off regular upper body clothing?: A Little Help from another person to put on and taking off regular lower body clothing?: A Lot 6 Click Score: 16   End of Session    Activity Tolerance: Patient limited by pain Patient left: in bed;with call bell/phone within reach;with bed alarm set;with SCD's reapplied  OT Visit Diagnosis: Other abnormalities of gait and mobility (R26.89);Pain;Muscle weakness (generalized) (M62.81) Pain - Right/Left: Right Pain - part of body: Hip                 Time: [id number] OT Time Calculation (min): 21 min Charges:  OT General Charges $OT Visit: 1 Visit OT Evaluation $OT Re-eval: 1 Re-eval  Richrd PrimeJamie Stiller, MPH, MS, OTR/L ascom [phone] 11/26/18, 1:07 PM

## 2018-11-26 NOTE — TOC Transition Note (Signed)
Transition of Care [facility]) - Progression Note    Patient Details  Name: Sarah Mckee MRN: [id number] Date of Birth: [birth year]  Transition of Care [facility]) CM/SW [place], RN Phone Number: 11/26/2018, 1:59 PM  Clinical Narrative:    Patient to discharge to [facility] room 805 The patient's spouse Fritz Pickerel was called and alerted of the discharge Bedside nurse to call report to [facility] at [phone] Also to call EMS for transport once ready     Expected Discharge Plan: [place] Barriers to Discharge: Barriers Resolved  Expected Discharge Plan and Services Expected Discharge Plan: [place]   Discharge Planning Services: CM Consult Post Acute Care Choice: [place] arrangements for the past 2 months: Single Family Home                 DME Arranged: N/A         HH Arranged: PT HH Agency: [facility] ([place]) Date HH Agency Contacted: 11/19/18 Time [place]: 1206 Representative spoke with at [place]: [place] ([place]) Interventions    Readmission Risk Interventions No flowsheet data found.

## 2018-11-26 NOTE — Care Management Important Message (Signed)
Important Message  Patient Details  Name: Sarah Mckee MRN: [id number] Date of Birth: 1946/05/13   Medicare Important Message Given:  Yes     Juliann Pulse A Mane Consolo 11/26/2018, 10:44 AM

## 2018-11-26 NOTE — Progress Notes (Signed)
EMS here to transport patient to [facility]

## 2018-11-26 NOTE — Evaluation (Signed)
Physical Therapy RE-Evaluation Patient Details Name: Sarah Mckee MRN: [id number] DOB: [birth year] Today's Date: 11/26/2018   History of Present Illness  Patient is a pleasant 73 year old female with chronic respiratory failure secondary to COPD on 3 L home oxygen, hypertension, lymphedema, pulmonary hypertension presents to hospital after mechanical fall and right hip pain. Pt s/p R IM nail on 7/2 ([facility]AT). Kyphoplasty 7/9 L3-4.  Clinical Impression  Patient is a pleasant 73 year old female whose re-evaluation is performed due to patient receiving kyphoplasty on 7/9 for levels L3-4. Co-treat with OT performed due to high level of needs from patient and for patient safety/mobility. Patient requires Max A for rolling x2 assist for bed mobility/cleanup with cueing for sequencing/hand placement. Patient is on 3L of 02 via nasal cannula and at rest Sp02 was 97% with HR 93, after rolling Sp02 reduced to 88% requiring cueing for breathing techniques for pain reduction, relaxation, and increased oxygen consumption. Patient's current level of function remains similar to prior to kyphoplasty. Patient's mobility limited by pain at this re-evaluation. Nursing present during session to place patch on patient and CNA present to assist with cleanup s/p bowel movement. Current POC remains appropriate at this time with patient requiring SNF placement and continued skilled physical therapy.     Follow Up Recommendations SNF    Equipment Recommendations  Rolling walker with 5" wheels;3in1 (PT);Wheelchair (measurements PT);Wheelchair cushion (measurements PT)    Recommendations for Other Services       Precautions / Restrictions Precautions Precautions: Fall Precaution Comments: skin tears and bruises easily Restrictions Weight Bearing Restrictions: Yes RLE Weight Bearing: Weight bearing as tolerated      Mobility  Bed Mobility Overal bed mobility: Needs Assistance Bed Mobility: Rolling Rolling: +2 for  physical assistance;Max assist(1 Max A to hold position during personal hygiene)         General bed mobility comments: requires assitance to hold position for cleaning, able to assist by holding on/reaching with guidance onto handrails of bed. Sp02 dropped to 88% on 3 L 02 via nasal cannula.  Transfers                 General transfer comment: Unable to attempt at re-eval due to pain  Ambulation/Gait             General Gait Details: unsafe/unable  Stairs            Wheelchair Mobility    Modified Rankin (Stroke Patients Only)       Balance Overall balance assessment: Needs assistance     Sitting balance - Comments: per previous notes needs assistance however unable to perform at re-eval due to pain.                                     Pertinent Vitals/Pain Pain Assessment: 0-10 Pain Score: 9  Pain Location: R hip/back Pain Descriptors / Indicators: Constant;Grimacing;Guarding;Moaning;Sharp;Other (Comment);Crying Pain Intervention(s): Limited activity within patient's tolerance;Monitored during session;Repositioned;Utilized relaxation techniques    Home Living Family/patient expects to be discharged to:: Private residence Living Arrangements: Spouse/significant other Available Help at Discharge: Family;Available 24 hours/day Type of Home: House Home Access: Stairs to enter Entrance Stairs-Rails: Left Entrance Stairs-Number of Steps: 5 Home Layout: One level Home Equipment: Emergency planning/management officerhower seat;Walker - 2 wheels;Cane - single point      Prior Function Level of Independence: Needs assistance   Gait / Transfers Assistance Needed: [facility]. SPC for community distances. Pt endorses she does not move much 2/2 fatigue/SOB.  Baseline O2 at 3L  ADL's / Homemaking Assistance Needed: Family assists with IADL; Dtr visits weekly to support bathing.  Comments: Does endorse at least 1-2 falls in previous six months     Hand  Dominance   Dominant Hand: Right    Extremity/Trunk Assessment   Upper Extremity Assessment Upper Extremity Assessment: Defer to OT evaluation    Lower Extremity Assessment Lower Extremity Assessment: RLE deficits/detail;LLE deficits/detail(difficult to assess due to pain) RLE Deficits / Details: s/p R IM nail: RLE: Unable to fully assess due to pain RLE Coordination: decreased fine motor;decreased gross motor LLE Deficits / Details: requires assistance for bed mobility, pain in back limit testing LLE: Unable to fully assess due to pain LLE Coordination: decreased gross motor    Cervical / Trunk Assessment Cervical / Trunk Assessment: (s/p L3-4 kyphoplasty)  Communication   Communication: No difficulties  Cognition Arousal/Alertness: Awake/alert Behavior During Therapy: WFL for tasks assessed/performed Overall Cognitive Status: Within Functional Limits for tasks assessed                                 General Comments: Patient anxious about movement, tightening of features prior to movement attempts.      General Comments General comments (skin integrity, edema, etc.): Noticeable bruising of R distal medial calf and mid chest. Patient reports it is from her kyphoplasty surgery. Noticeable pitting edema in bilateral ankles with excessive plantarflexion in R foot.    Exercises Other Exercises Other Exercises: Patient performed supine bed mobility/rolling requiring Max A x2 for cleanup s/p bowel movement. Patient educated on breathing techniques for pain reduction and to increase SP02 from 88% after rolling. Other Exercises: Gentle df overpressure stretching to R ankle for reduction of plantarflexor positioning with prolonged holds for increased muscle tissue lengthening. AAROM of L and R LE for bed cleanup/donning/doffing equiptment   Assessment/Plan    PT Assessment Patient needs continued PT services  PT Problem List Decreased range of motion;Decreased  activity tolerance;Decreased balance;Decreased mobility;Decreased strength;Decreased coordination;Decreased cognition;Decreased knowledge of use of DME;Decreased safety awareness;Cardiopulmonary status limiting activity;Decreased knowledge of precautions;Decreased skin integrity;Pain       PT Treatment Interventions DME instruction;Balance training;Gait training;Stair training;Functional mobility training;Therapeutic activities;Therapeutic exercise;Cognitive remediation;Patient/family education    PT Goals (Current goals can be found in the Care Plan section)  Acute Rehab PT Goals Patient Stated Goal: to return to walking PT Goal Formulation: With patient Time For Goal Achievement: 12/10/18 Potential to Achieve Goals: Fair    Frequency 7X/week   Barriers to discharge Decreased caregiver support      Co-evaluation PT/OT/SLP Co-Evaluation/Treatment: Yes Reason for Co-Treatment: Complexity of the patient's impairments (multi-system involvement);To address functional/ADL transfers;For patient/therapist safety PT goals addressed during session: Mobility/safety with mobility OT goals addressed during session: ADL's and self-care       AM-PAC PT "6 Clicks" Mobility  Outcome Measure Help needed turning from your back to your side while in a flat bed without using bedrails?: Total Help needed moving from lying on your back to sitting on the side of a flat bed without using bedrails?: Total Help needed moving to and from a bed to a chair (including a wheelchair)?: Total Help needed standing up from a chair using your arms (e.g., wheelchair or bedside chair)?: Total Help needed to walk in hospital room?: Total Help needed climbing 3-5 steps with a  railing? : Total 6 Click Score: 6    End of Session Equipment Utilized During Treatment: Oxygen(3 L via nasal cannula) Activity Tolerance: Patient tolerated treatment well;Patient limited by fatigue;Patient limited by pain Patient left: in  bed;with call bell/phone within reach;with bed alarm set;with nursing/sitter in room Nurse Communication: Mobility status PT Visit Diagnosis: Muscle weakness (generalized) (M62.81);Difficulty in walking, not elsewhere classified (R26.2);Pain Pain - Right/Left: Right Pain - part of body: Hip(and back)    Time: [id number] PT Time Calculation (min) (ACUTE ONLY): 21 min   Charges:   PT Evaluation $PT Re-evaluation: 1 Re-eval          Precious BardMarina Trisa Cranor, PT, DPT    Precious BardMarina Kyrsten Deleeuw 11/26/2018, 11:55 AM

## 2018-11-26 NOTE — Progress Notes (Signed)
Report called to Agricultural engineer at [employer]. IV removed and belongings packed. AVS printed and put in folder. EMS called.

## 2018-11-26 NOTE — Discharge Summary (Signed)
[facility]   PATIENT NAME: Sarah Mckee    MR#:  [id number]  DATE OF BIRTH:  1945/05/31  DATE OF ADMISSION:  11/17/2018 ADMITTING PHYSICIAN: Hannah Beat, MD  DATE OF DISCHARGE: 11/26/2018   PRIMARY CARE PHYSICIAN: Patrice Paradise, MD    ADMISSION DIAGNOSIS:  History of pelvic fracture [Z87.81] Fall, initial encounter [W19.XXXA] Avascular necrosis of femur, unspecified laterality (HCC) [M87.059] COPD, severe (HCC) [J44.9] Closed comminuted intertrochanteric fracture of proximal end of right femur, initial encounter (HCC) [S72.141A]  DISCHARGE DIAGNOSIS:  Active Problems:   Closed right hip fracture (HCC)   SECONDARY DIAGNOSIS:   Past Medical History:  Diagnosis Date  . Arthritis   . COPD (chronic obstructive pulmonary disease) (HCC)    2L 02 at baseline  . Hypertension   . Lymphedema of left leg   . Lymphedema of right lower extremity   . Pulmonary HTN (HCC)     HOSPITAL COURSE:   73 year old female with chronic respiratory failure secondary to COPD on 3 L home oxygen, hypertension, lymphedema, compression fx of lower back, pulmonary hypertension and chronic bilateral femoral head avascular necrosis  presents to hospital after mechanical fall and right hip pain.  1.  Right hip intertrochanteric fracture-secondary to mechanical fall - Status post ORIF, POD # 8 - Continue pain control with Dilaudid - PT/OT following current plan is discharge to SNF  - Will need [facility] orthopaedic follow up in 6 weeks for repeat x-rays of the right femur - Ortho input appreciated  2.  Chronic respiratory failure-secondary to COPD.  On 3 L home oxygen all the time - No acute exacerbation identified - Continue inhalers, nebulizers.  - Lasix PRN  3. Chronic compression fx of vertebral - worsening pain since fall. Hx of osteoporosis s/p Fosamax and Actonel -  Lumbar verterbral x-ray shows old fxs and interval mild compression of  indeterminate age.  - Follow up MRI lumbar shows acute/subacute compression fractures of L3-L4 - Continue pain management as above - Kyphoplasty 11/25/18.  4. Hypertension-on Maxzide  5. Depression-Zoloft  6. DVT prophylaxis- Hold Lovenox for procedure tomorrow  7. Thombocytopenia - unknown etiology, no signs of bleeding or infection - Continue to monitor   DISCHARGE CONDITIONS:   Stable.  CONSULTS OBTAINED:  Treatment Team:  Kennedy Bucker, MD  DRUG ALLERGIES:   Allergies  Allergen Reactions  . Codeine Shortness Of Breath  . Demerol [Meperidine] Shortness Of Breath  . Morphine And Related Shortness Of Breath  . Oxycodone Shortness Of Breath  . Alendronate Other (See Comments)    Other reaction(s): Other (See Comments) esophagitis  . Amoxicillin-Pot Clavulanate Other (See Comments)    Unknown Has patient had a PCN reaction causing immediate rash, facial/tongue/throat swelling, SOB or lightheadedness with hypotension: Unknown Has patient had a PCN reaction causing severe rash involving mucus membranes or skin necrosis: Unknown Has patient had a PCN reaction that required hospitalization: Unknown Has patient had a PCN reaction occurring within the last 10 years: Unknown If all of the above answers are "NO", then may proceed with Cephalosporin use.   Marland Kitchen Risedronate Other (See Comments)    Other reaction(s): Other (See Comments) epigastric pain  . Shellfish Allergy Nausea And Vomiting    DISCHARGE MEDICATIONS:   Allergies as of 11/26/2018      Reactions   Codeine Shortness Of Breath   Demerol [meperidine] Shortness Of Breath   Morphine And Related Shortness Of Breath   Oxycodone Shortness Of Breath  Alendronate Other (See Comments)   Other reaction(s): Other (See Comments) esophagitis   Amoxicillin-pot Clavulanate Other (See Comments)   Unknown Has patient had a PCN reaction causing immediate rash, facial/tongue/throat swelling, SOB or lightheadedness with  hypotension: Unknown Has patient had a PCN reaction causing severe rash involving mucus membranes or skin necrosis: Unknown Has patient had a PCN reaction that required hospitalization: Unknown Has patient had a PCN reaction occurring within the last 10 years: Unknown If all of the above answers are "NO", then may proceed with Cephalosporin use.   Risedronate Other (See Comments)   Other reaction(s): Other (See Comments) epigastric pain   Shellfish Allergy Nausea And Vomiting      Medication List    STOP taking these medications   OXYGEN     TAKE these medications   acetaminophen 500 MG tablet Commonly known as: TYLENOL Take 500-1,000 mg by mouth See admin instructions. Take 500 mg by mouth in the morning/afternoon and take 1000 mg by mouth in the evening   ALPRAZolam 0.25 MG tablet Commonly known as: XANAX Take 1 tablet (0.25 mg total) by mouth 2 (two) times daily as needed for anxiety.   aluminum-magnesium hydroxide-simethicone [phone] MG/5ML Susp Commonly known as: MAALOX Take 30 mLs by mouth 4 (four) times daily -  before meals and at bedtime. What changed:   when to take this  reasons to take this   ASPERCREME LIDOCAINE EX Apply 1 application topically as needed (for hand/shoulder pain).   aspirin 81 MG tablet Take 81 mg by mouth daily.   budesonide 0.5 MG/2ML nebulizer solution Commonly known as: PULMICORT Inhale 2 mLs into the lungs 2 (two) times daily.   docusate sodium 100 MG capsule Commonly known as: COLACE Take 1 capsule (100 mg total) by mouth 2 (two) times daily.   enoxaparin 40 MG/0.4ML injection Commonly known as: LOVENOX Inject 0.4 mLs (40 mg total) into the skin daily for 14 days. X 14 days   feeding supplement (ENSURE ENLIVE) Liqd Take 237 mLs by mouth 2 (two) times daily between meals.   ferrous sulfate 325 (65 FE) MG tablet Take 1 tablet (325 mg total) by mouth 2 (two) times daily with a meal.   fluticasone furoate-vilanterol 200-25  MCG/INH Aepb Commonly known as: Breo Ellipta Inhale 1 puff into the lungs daily.   furosemide 40 MG tablet Commonly known as: LASIX Take 40 mg by mouth daily as needed for fluid.   HYDROmorphone 2 MG tablet Commonly known as: DILAUDID Take 1 tablet (2 mg total) by mouth every 6 (six) hours as needed for moderate pain or severe pain.   methocarbamol 750 MG tablet Commonly known as: ROBAXIN Take 1 tablet (750 mg total) by mouth 3 (three) times daily.   multivitamin with minerals Tabs tablet Take 1 tablet by mouth daily. Start taking on: November 27, 2018   nystatin cream Commonly known as: MYCOSTATIN Apply 1 application topically 2 (two) times daily.   polyethylene glycol 17 g packet Commonly known as: MiraLax Take 17 g by mouth daily.   potassium chloride SA 20 MEQ tablet Commonly known as: K-DUR Take 20 mEq by mouth daily.   ProAir HFA 108 (90 Base) MCG/ACT inhaler Generic drug: albuterol Inhale 2 puffs into the lungs every 4 (four) hours as needed for wheezing or shortness of breath.   albuterol (2.5 MG/3ML) 0.083% nebulizer solution Commonly known as: PROVENTIL USE 1 VIAL IN NEBULIZER EVERY 6 HOURS AS NEEDED FOR WHEEZING OR SHORTNESS OF BREATH  sertraline 25 MG tablet Commonly known as: ZOLOFT Take 50 mg by mouth daily.   SYSTANE OP Place 1 drop into both eyes daily.   Tiotropium Bromide Monohydrate 2.5 MCG/ACT Aers Commonly known as: Spiriva Respimat INHALE 2 PUFFS INTO THE LUNGS BY DAILY   triamterene-hydrochlorothiazide 37.5-25 MG tablet Commonly known as: MAXZIDE-25 Take 1 tablet by mouth daily.   Vitamin D 400 UNIT/ML Liqd Take by mouth. Pt taking pill form,   2 tablets daily        DISCHARGE INSTRUCTIONS:    Follow with Ortho clinic in 1-2 weeks.  If you experience worsening of your admission symptoms, develop shortness of breath, life threatening emergency, suicidal or homicidal thoughts you must seek medical attention immediately by calling  911 or calling your MD immediately  if symptoms less severe.  You Must read complete instructions/literature along with all the possible adverse reactions/side effects for all the Medicines you take and that have been prescribed to you. Take any new Medicines after you have completely understood and accept all the possible adverse reactions/side effects.   Please note  You were cared for by a hospitalist during your hospital stay. If you have any questions about your discharge medications or the care you received while you were in the hospital after you are discharged, you can call the unit and asked to speak with the hospitalist on call if the hospitalist that took care of you is not available. Once you are discharged, your primary care physician will handle any further medical issues. Please note that NO REFILLS for any discharge medications will be authorized once you are discharged, as it is imperative that you return to your primary care physician (or establish a relationship with a primary care physician if you do not have one) for your aftercare needs so that they can reassess your need for medications and monitor your lab values.    Today   CHIEF COMPLAINT:   Chief Complaint  Patient presents with  . Fall  . Hip Pain    HISTORY OF PRESENT ILLNESS:  Sarah Mckee  is a 73 y.o. female with a known history of COPD and hypertension, who presented to the emergency room with concern of right hip pain after having an accidental mechanical fall.  She denied any headache or dizziness, presyncope or syncope.  No chest pain or dyspnea or palpitations.  No recent worsening cough or wheezing or shortness of breath.  No fever or chills.  No recent sick exposures.  Upon presentation to the emergency room, heart rate was 101 and pulse ox entry was 98% on 3 L of O2 by nasal cannula which is her baseline with otherwise  normal ital signs.  Labs were remarkable for macrocytosis, a CO2 39 and chloride of  95.  Her COVID-19 test came back negative.  Portable chest x-ray showed emphysema with no acute cardiopulmonary disease.  Hip x-ray revealed comminuted acute intertrochanteric fracture of the right femur with varus impaction and chronic bilateral femoral head AVN with chronic right inferior pubic ramus fracture.  The patient was given 4 mg of IV morphine sulfate and 4 mg of IV Zofran as well as hydrated with IV normal saline.  She will be admitted to surgical bed for further evaluation and management.    VITAL SIGNS:  Blood pressure (!) 113/54, pulse 90, temperature 97.8 F (36.6 C), temperature source Oral, resp. rate 18, height 5\' 2"  (1.575 m), weight 87 kg, SpO2 99 %.  I/O:  Intake/Output Summary (Last 24 hours) at 11/26/2018 1547 Last data filed at 11/26/2018 1137 Gross per 24 hour  Intake 520 ml  Output 902 ml  Net -382 ml    PHYSICAL EXAMINATION:   GENERAL:  73 y.o.-year-old patient lying in the bed with no acute distress.  EYES: Pupils equal, round, reactive to light and accommodation. No scleral icterus. Extraocular muscles intact.  HEENT: Head atraumatic, normocephalic. Oropharynx and nasopharynx clear.  NECK:  Supple, no jugular venous distention. No thyroid enlargement, no tenderness.  LUNGS: Normal breath sounds bilaterally, mild wheezing no rales,rhonchi or crepitation.  Decreased bibasilar breath sounds.  No use of accessory muscles of respiration.  CARDIOVASCULAR: S1, S2 normal. No  rubs, or gallops.  2/6 systolic murmur is present ABDOMEN: Soft, nontender, nondistended. Bowel sounds present. No organomegaly or mass.  EXTREMITIES: 2+ bilateral pedal edema noted.  No  cyanosis, or clubbing.  Right leg is swollen, dressing in place on the right lateral thigh NEUROLOGIC: Cranial nerves II through XII are intact. Muscle strength 4/5 in all extremities except right lower extremity due to pain. Sensation intact. Gait not checked.  PSYCHIATRIC: The patient is alert and  oriented, slightly groggy from pain medication.  SKIN: No obvious rash, lesion, or ulcer.    DATA REVIEW:   CBC Recent Labs  Lab 11/25/18 0500  WBC 8.3  HGB 9.4*  HCT 30.1*  PLT 149*    Chemistries  Recent Labs  Lab 11/22/18 0427 11/25/18 0448  NA 140  --   K 4.0  --   CL 95*  --   CO2 39*  --   GLUCOSE 110*  --   BUN 17  --   CREATININE 0.39* 0.44  CALCIUM 8.1*  --     Cardiac Enzymes No results for input(s): TROPONINI in the last 168 hours.  Microbiology Results  Results for orders placed or performed during the hospital encounter of 11/17/18  SARS Coronavirus 2 (CEPHEID - Performed in [facility] hospital lab), Hosp Order     Status: None   Collection Time: 11/17/18 11:57 PM   Specimen: Urine, Catheterized; Nasopharyngeal  Result Value Ref Range Status   SARS Coronavirus 2 NEGATIVE NEGATIVE Final    Comment: (NOTE) If result is NEGATIVE SARS-CoV-2 target nucleic acids are NOT DETECTED. The SARS-CoV-2 RNA is generally detectable in upper and lower  respiratory specimens during the acute phase of infection. The lowest  concentration of SARS-CoV-2 viral copies this assay can detect is 250  copies / mL. A negative result does not preclude SARS-CoV-2 infection  and should not be used as the sole basis for treatment or other  patient management decisions.  A negative result may occur with  improper specimen collection / handling, submission of specimen other  than nasopharyngeal swab, presence of viral mutation(s) within the  areas targeted by this assay, and inadequate number of viral copies  (<250 copies / mL). A negative result must be combined with clinical  observations, patient history, and epidemiological information. If result is POSITIVE SARS-CoV-2 target nucleic acids are DETECTED. The SARS-CoV-2 RNA is generally detectable in upper and lower  respiratory specimens dur ing the acute phase of infection.  Positive  results are indicative of active  infection with SARS-CoV-2.  Clinical  correlation with patient history and other diagnostic information is  necessary to determine patient infection status.  Positive results do  not rule out bacterial infection or co-infection with other viruses. If result is PRESUMPTIVE POSTIVE SARS-CoV-2 nucleic acids MAY  BE PRESENT.   A presumptive positive result was obtained on the submitted specimen  and confirmed on repeat testing.  While 2019 novel coronavirus  (SARS-CoV-2) nucleic acids may be present in the submitted sample  additional confirmatory testing may be necessary for epidemiological  and / or clinical management purposes  to differentiate between  SARS-CoV-2 and other Sarbecovirus currently known to infect humans.  If clinically indicated additional testing with an alternate test  methodology [phone]([id number]) is advised. The SARS-CoV-2 RNA is generally  detectable in upper and lower respiratory sp ecimens during the acute  phase of infection. The expected result is Negative. Fact Sheet for Patients:  BoilerBrush.com.cyhttps://www.fda.gov/media/136312/download Fact Sheet for Healthcare Providers: https://pope.com/https://www.fda.gov/media/136313/download This test is not yet approved or cleared by the [place] FDA and has been authorized for detection and/or diagnosis of SARS-CoV-2 by FDA under an Emergency Use Authorization (EUA).  This EUA will remain in effect (meaning this test can be used) for the duration of the COVID-19 declaration under Section 564(b)(1) of the Act, 21 U.S.C. section 360bbb-3(b)(1), unless the authorization is terminated or revoked sooner. Performed at [facility], [address] [place], [place] [address]   Surgical pcr screen     Status: None   Collection Time: 11/18/18  4:15 AM   Specimen: Nasal Mucosa; Nasal Swab  Result Value Ref Range Status   MRSA, PCR NEGATIVE NEGATIVE Final   Staphylococcus aureus NEGATIVE NEGATIVE Final    Comment: (NOTE) The Xpert SA Assay (FDA  approved for NASAL specimens in patients 72 years of age and older), is one component of a comprehensive surveillance program. It is not intended to diagnose infection nor to guide or monitor treatment. Performed at [facility], [address] [place], [place] [address]   SARS Coronavirus 2 (CEPHEID - Performed in [facility] hospital lab), Hosp Order     Status: None   Collection Time: 11/23/18  1:09 PM   Specimen: Nasopharyngeal Swab  Result Value Ref Range Status   SARS Coronavirus 2 NEGATIVE NEGATIVE Final    Comment: (NOTE) If result is NEGATIVE SARS-CoV-2 target nucleic acids are NOT DETECTED. The SARS-CoV-2 RNA is generally detectable in upper and lower  respiratory specimens during the acute phase of infection. The lowest  concentration of SARS-CoV-2 viral copies this assay can detect is 250  copies / mL. A negative result does not preclude SARS-CoV-2 infection  and should not be used as the sole basis for treatment or other  patient management decisions.  A negative result may occur with  improper specimen collection / handling, submission of specimen other  than nasopharyngeal swab, presence of viral mutation(s) within the  areas targeted by this assay, and inadequate number of viral copies  (<250 copies / mL). A negative result must be combined with clinical  observations, patient history, and epidemiological information. If result is POSITIVE SARS-CoV-2 target nucleic acids are DETECTED. The SARS-CoV-2 RNA is generally detectable in upper and lower  respiratory specimens dur ing the acute phase of infection.  Positive  results are indicative of active infection with SARS-CoV-2.  Clinical  correlation with patient history and other diagnostic information is  necessary to determine patient infection status.  Positive results do  not rule out bacterial infection or co-infection with other viruses. If result is PRESUMPTIVE POSTIVE SARS-CoV-2 nucleic acids MAY  BE PRESENT.   A presumptive positive result was obtained on the submitted specimen  and confirmed on repeat testing.  While 2019 novel coronavirus  (SARS-CoV-2)  nucleic acids may be present in the submitted sample  additional confirmatory testing may be necessary for epidemiological  and / or clinical management purposes  to differentiate between  SARS-CoV-2 and other Sarbecovirus currently known to infect humans.  If clinically indicated additional testing with an alternate test  methodology [phone]) is advised. The SARS-CoV-2 RNA is generally  detectable in upper and lower respiratory sp ecimens during the acute  phase of infection. The expected result is Negative. Fact Sheet for Patients:  BoilerBrush.com.cy Fact Sheet for Healthcare Providers: https://pope.com/ This test is not yet approved or cleared by the [place] FDA and has been authorized for detection and/or diagnosis of SARS-CoV-2 by FDA under an Emergency Use Authorization (EUA).  This EUA will remain in effect (meaning this test can be used) for the duration of the COVID-19 declaration under Section 564(b)(1) of the Act, 21 U.S.C. section 360bbb-3(b)(1), unless the authorization is terminated or revoked sooner. Performed at [facility], [address] [place], [place] [address]   SARS Coronavirus 2 [facility] order, Performed in [facility] hospital lab)     Status: None   Collection Time: 11/26/18  1:49 PM   Specimen: Nasopharyngeal Swab  Result Value Ref Range Status   SARS Coronavirus 2 NEGATIVE NEGATIVE Final    Comment: (NOTE) If result is NEGATIVE SARS-CoV-2 target nucleic acids are NOT DETECTED. The SARS-CoV-2 RNA is generally detectable in upper and lower  respiratory specimens during the acute phase of infection. The lowest  concentration of SARS-CoV-2 viral copies this assay can detect is 250  copies / mL. A negative result does not preclude  SARS-CoV-2 infection  and should not be used as the sole basis for treatment or other  patient management decisions.  A negative result may occur with  improper specimen collection / handling, submission of specimen other  than nasopharyngeal swab, presence of viral mutation(s) within the  areas targeted by this assay, and inadequate number of viral copies  (<250 copies / mL). A negative result must be combined with clinical  observations, patient history, and epidemiological information. If result is POSITIVE SARS-CoV-2 target nucleic acids are DETECTED. The SARS-CoV-2 RNA is generally detectable in upper and lower  respiratory specimens dur ing the acute phase of infection.  Positive  results are indicative of active infection with SARS-CoV-2.  Clinical  correlation with patient history and other diagnostic information is  necessary to determine patient infection status.  Positive results do  not rule out bacterial infection or co-infection with other viruses. If result is PRESUMPTIVE POSTIVE SARS-CoV-2 nucleic acids MAY BE PRESENT.   A presumptive positive result was obtained on the submitted specimen  and confirmed on repeat testing.  While 2019 novel coronavirus  (SARS-CoV-2) nucleic acids may be present in the submitted sample  additional confirmatory testing may be necessary for epidemiological  and / or clinical management purposes  to differentiate between  SARS-CoV-2 and other Sarbecovirus currently known to infect humans.  If clinically indicated additional testing with an alternate test  methodology [phone]) is advised. The SARS-CoV-2 RNA is generally  detectable in upper and lower respiratory sp ecimens during the acute  phase of infection. The expected result is Negative. Fact Sheet for Patients:  BoilerBrush.com.cy Fact Sheet for Healthcare Providers: https://pope.com/ This test is not yet approved or cleared by the  [place] FDA and has been authorized for detection and/or diagnosis of SARS-CoV-2 by FDA under an Emergency Use Authorization (EUA).  This EUA will remain in effect (  meaning this test can be used) for the duration of the COVID-19 declaration under Section 564(b)(1) of the Act, 21 U.S.C. section 360bbb-3(b)(1), unless the authorization is terminated or revoked sooner. Performed at [facility], [address] [place], [place] [address]     RADIOLOGY:  Dg Lumbar Spine 2-3 Views  Result Date: 11/25/2018 CLINICAL DATA:  Benign compression fractures of L3 and L4. Kyphoplasties. EXAM: LUMBAR SPINE - 2-3 VIEW; DG C-ARM 61-120 MIN COMPARISON:  Lumbar MRI dated 11/23/2018 FINDINGS: AP and lateral C-arm images demonstrate and at kyphoplasty has been performed at L3 and L4. IMPRESSION: Kyphoplasty at L3 and L4. FLUOROSCOPY TIME:  2 minutes 3 seconds C-arm fluoroscopic images were obtained intraoperatively and submitted for post operative interpretation. Electronically Signed   By: Francene Boyers M.D.   On: 11/25/2018 10:14   Dg C-arm 1-60 Min  Result Date: 11/25/2018 CLINICAL DATA:  Benign compression fractures of L3 and L4. Kyphoplasties. EXAM: LUMBAR SPINE - 2-3 VIEW; DG C-ARM 61-120 MIN COMPARISON:  Lumbar MRI dated 11/23/2018 FINDINGS: AP and lateral C-arm images demonstrate and at kyphoplasty has been performed at L3 and L4. IMPRESSION: Kyphoplasty at L3 and L4. FLUOROSCOPY TIME:  2 minutes 3 seconds C-arm fluoroscopic images were obtained intraoperatively and submitted for post operative interpretation. Electronically Signed   By: Francene Boyers M.D.   On: 11/25/2018 10:14    EKG:   Orders placed or performed during the hospital encounter of 11/17/18  . ED EKG  . ED EKG  . EKG 12-Lead  . EKG 12-Lead      Management plans discussed with the patient, family and they are in agreement.  CODE STATUS:     Code Status Orders  (From admission, onward)         Start      Ordered   11/18/18 0159  Full code  Continuous     11/18/18 0204        Code Status History    Date Active Date Inactive Code Status Order ID Comments User Context   06/27/2017 1303 06/30/2017 2031 Full Code [id number]  [name], MD Inpatient   08/11/2015 1130 08/16/2015 1712 Full Code [id number]  [name], MD ED   Advance Care Planning Activity      TOTAL TIME TAKING CARE OF THIS PATIENT: 35 minutes.    Altamese Dilling M.D on 11/26/2018 at 3:47 PM  Between 7am to 6pm - Pager - [phone]  After 6pm go to [email or web address] - password [email or web address]  [facility]  Office  [phone]  CC: Primary care physician; Patrice Paradise, MD   Note: This dictation was prepared with Dragon dictation along with smaller phrase technology. Any transcriptional errors that result from this process are unintentional.

## 2018-11-26 NOTE — Progress Notes (Addendum)
Initial Nutrition Assessment  DOCUMENTATION CODES:   Obesity unspecified  INTERVENTION:   Ensure Enlive po BID, each supplement provides 350 kcal and 20 grams of protein  MVI daily   Recommend Oscal with vitamin D BID   Bowel regimen per MD  NUTRITION DIAGNOSIS:   Increased nutrient needs related to post-op healing as evidenced by increased estimated needs.  GOAL:   Patient will meet greater than or equal to 90% of their needs  MONITOR:   PO intake, Supplement acceptance, Labs, Weight trends, Skin, I & O's  REASON FOR ASSESSMENT:   LOS    ASSESSMENT:   73 year old female with chronic respiratory failure secondary to COPD on 3 L home oxygen, hypertension, lymphedema, compression fx of lower back, pulmonary hypertension and chronic bilateral femoral head avascular necrosis  presents to hospital after mechanical fall and hip fracture now s/p ORIF 7/2 and kyphoplasty for compression fracture 7/9. Patient is followed by outpatient palliative care  RD working remotely.  Pt with poor appetite and oral intake in hospital; pt eating < 25% of meals. RD will add supplements and MVI to help pt meet her estimated needs and support post op healing. Per chart, pt appears weight stable pta but is up ~ 9lbs since admit. Pt is noted to have deep pitting edema in BLE. Type I BM noted today; recommend bowel regimen as needed per MD.   Medications reviewed and include: D3, lovenox, ferrous sulfate, KCl  Labs reviewed: Hgb 9.4(L), Hct 30.1(L)- 7/9  Unable to complete Nutrition-Focused physical exam at this time.   Diet Order:   Diet Order            Diet regular Room service appropriate? Yes; Fluid consistency: Thin  Diet effective now             EDUCATION NEEDS:   Not appropriate for education at this time  Skin:  Skin Assessment: Reviewed RN Assessment(incision R hip)  Last BM:  7/10- type 1  Height:   Ht Readings from Last 1 Encounters:  11/25/18 5\' 2"  (1.575 m)     Weight:   Wt Readings from Last 1 Encounters:  11/25/18 87 kg    Ideal Body Weight:  50 kg  BMI:  Body mass index is 35.08 kg/m.  Estimated Nutritional Needs:   Kcal:  1700-1900kcal/day  Protein:  87-95g/day  Fluid:  1.5L/day  Koleen Distance MS, RD, LDN Pager #- [phone] Office#- [phone] After Hours Pager: [phone]

## 2018-11-26 NOTE — Progress Notes (Signed)
OT Cancellation Note  Patient Details Name: Sarah Mckee MRN: [id number] DOB: [birth year]   Cancelled Treatment:    Reason Eval/Treat Not Completed: Pain limiting ability to participate. Spoke with RN leaving pt's room. RN reporting pt in 8/10 pain and just took pain medications. Recommends holding OT tx for later for optimal pain control.   Jeni Salles, MPH, MS, OTR/L ascom [phone] 11/26/18, 9:24 AM

## 2018-11-26 NOTE — Progress Notes (Signed)
PT Cancellation Note  Patient Details Name: Orah Sonnen MRN: [id number] DOB: 01-Feb-1946   Cancelled Treatment:    Reason Eval/Treat Not Completed: Patient declined, no reason specified;Other (comment)(Patient order recieved and reviewed. Upon entering room patient is eating breakfast and requests PT to return later. Will attempt again at later time/date.)  Janna Arch, PT, DPT   11/26/2018, 8:47 AM

## 2018-11-28 DIAGNOSIS — S72141D Displaced intertrochanteric fracture of right femur, subsequent encounter for closed fracture with routine healing: Secondary | ICD-10-CM | POA: Diagnosis not present

## 2018-11-28 DIAGNOSIS — J449 Chronic obstructive pulmonary disease, unspecified: Secondary | ICD-10-CM | POA: Diagnosis not present

## 2018-11-28 DIAGNOSIS — Z8659 Personal history of other mental and behavioral disorders: Secondary | ICD-10-CM | POA: Diagnosis not present

## 2018-11-28 DIAGNOSIS — I10 Essential (primary) hypertension: Secondary | ICD-10-CM | POA: Diagnosis not present

## 2018-11-28 DIAGNOSIS — Z8719 Personal history of other diseases of the digestive system: Secondary | ICD-10-CM | POA: Diagnosis not present

## 2018-11-28 DIAGNOSIS — Z862 Personal history of diseases of the blood and blood-forming organs and certain disorders involving the immune mechanism: Secondary | ICD-10-CM | POA: Diagnosis not present

## 2018-11-29 ENCOUNTER — Telehealth: Payer: Self-pay | Admitting: Primary Care

## 2018-11-29 NOTE — Telephone Encounter (Signed)
T/c to daughter, patient is at [facility]. Have called SNF for referral.

## 2018-11-30 ENCOUNTER — Telehealth: Payer: Self-pay

## 2018-11-30 NOTE — Telephone Encounter (Signed)
At the request of Palliative care NP, phone call returned to patient's daughter. Daughter made aware that NP was out of office today but has a schedule virtual meeting with [facility] tomorrow. Daughter shared that patient was d/c from hospital on Friday evening and was admitted to [facility]. Patient has demonstrated decline. She has unmanaged pain despite medication of dilaudid. Increasing weakness as evidenced by patient is no longer able to hold the phone to her ear. Medications are crushed now as she has demonstrated difficulty swallowing. Patient has been refusing nebulizer treatments as well.  Palliative care NP updated and request for phone call to daughter after seeing patient.

## 2018-12-01 ENCOUNTER — Non-Acute Institutional Stay: Payer: PPO | Admitting: Primary Care

## 2018-12-01 ENCOUNTER — Other Ambulatory Visit: Payer: Self-pay

## 2018-12-01 DIAGNOSIS — Z515 Encounter for palliative care: Secondary | ICD-10-CM | POA: Diagnosis not present

## 2018-12-01 NOTE — Progress Notes (Signed)
[facility] Pall[facility] Care Consult Note Telephone: [phone]  Fax: [phone]  TELEHEALTH VISIT STATEMENT Due to the COVID-19 crisis, this visit was done via telemedicine from my office. It was initiated and consented to by this patient and/or family.  PATIENT NAME: Sarah Mckee DOB: May 12, 1946 MRN: [id number]  PRIMARY CARE PROVIDER:   Juluis Pitch, Aneth   REFERRING PROVIDER: Juluis Pitch, MD [address] [place],  [place] [address] [phone]   RESPONSIBLE PARTY:   Extended Emergency Contact Information Primary Emergency Contact: Mckee,Sarah B Address: [place], [place] [address] [place] Phone: [phone] Relation: Spouse Secondary Emergency Contact: Mckee, Sarah [address] [place] Phone: [phone] Relation: Daughter  Palliative Care was asked to follow this patient by consultation request of Marinda Elk, MD. This is  SNF initial visit. She is familiar to me from home palliative medicine.   ASSESSMENT AND RECOMMENDATIONS:   1. Goals of Care: Maximize quality of life and symptom management.  2. Symptom Management:  Delirium: Flat affect is a new onset, baseline is clear and interacitve mentation. Consider delirium from pain, trauma and changing of venue. Also consider increasing zoloft to 100 mg daily. Patient is taking about going to heaven.   UTI: On Macrobid, pending culture. Does have some mental changes from baseline. Has difficulty in swallowing and is possibly missing doses.  Consider rocephin injection if sensitive.    Dyspnea: Significant at baseline, has been using nebulizer due do fatigue and inability to use inhaler. Staff is helping with inhalers. Was also using albuterol frequently at baseline at home. Using spacer for albuterol. On oxygen at 3 liters.   Pain: Recommend continuing scheduling ongoing narcotic for  pain management, more frequently to treat pain in back. Had kyphoplasty for back fractures. States pain all over, has scheduled and prn dilaudid, scheduled acetaminophen. Recommend  keeping scheduled dilaudid and prn to manage pain from recent fractures.   Nutrition: Recommend to down grade texture due to c/o reported dysphagia. Speech eval ordered, Getting supplements, magic cup tid per Maudie Mercury, patient nurse.   3. Family /Caregiver/Community Supports: I spoke with daughter Sarah Mckee who endorses new confusion. Patient is at [facility] for rehab but is refusing to do PT. Daughter states she was positive about going to [facility] and refusing therapies is likely more born from delirium.   4. Cognitive / Functional decline: Cognition is much changed, she appears to be suffering from delirium from pain, uti, hospitalizations, etc syndrome. Not participating with PT due likely to pain and cognition.   5. Advanced Care Directive: DNR,  MOST outlines no intubation, ok to take  antibiotics, no feeding tube, IVs are ok. Discussed with daughter Sarah Mckee by phone. We also discussed possible scenarios of disposition depending on if patient improved or didn't.   6. Follow up Palliative Care Visit: Palliative care will continue to follow for goals of care clarification and symptom management. Return 1-2 weeks or prn.  I spent 45 minutes providing this consultation, from 1500 to 1545. More than 50% of the time in this consultation was spent coordinating communication.   HISTORY OF PRESENT ILLNESS:  Sarah Mckee is a 73 y.o. year old female with multiple medical problems including COPD, PH, OA, lymphedema, recent fracture of right femur and vertebrae, hip pinning and kyphoplasty, change in mental status. Palliative Care was asked to  help address goals of care.   CODE STATUS: DNR, DNI, limited hospitalization, IV, abx ok, no feeding tube.   PPS: 30% HOSPICE ELIGIBILITY/DIAGNOSIS: TBD  PAST MEDICAL HISTORY:  Past Medical  History:  Diagnosis Date  . Arthritis   . COPD (chronic obstructive pulmonary disease) (HCC)    2L 02 at baseline  . Hypertension   . Lymphedema of left leg   . Lymphedema of right lower extremity   . Pulmonary HTN (HCC)     SOCIAL HX:  Social History   Tobacco Use  . Smoking status: Former Smoker    Quit date: 05/20/2007    Years since quitting: 11.5  . Smokeless tobacco: Never Used  Substance Use Topics  . Alcohol use: No    ALLERGIES:  Allergies  Allergen Reactions  . Codeine Shortness Of Breath  . Demerol [Meperidine] Shortness Of Breath  . Morphine And Related Shortness Of Breath  . Oxycodone Shortness Of Breath  . Alendronate Other (See Comments)    Other reaction(s): Other (See Comments) esophagitis  . Amoxicillin-Pot Clavulanate Other (See Comments)    Unknown Has patient had a PCN reaction causing immediate rash, facial/tongue/throat swelling, SOB or lightheadedness with hypotension: Unknown Has patient had a PCN reaction causing severe rash involving mucus membranes or skin necrosis: Unknown Has patient had a PCN reaction that required hospitalization: Unknown Has patient had a PCN reaction occurring within the last 10 years: Unknown If all of the above answers are "NO", then may proceed with Cephalosporin use.   Marland Kitchen. Risedronate Other (See Comments)    Other reaction(s): Other (See Comments) epigastric pain  . Shellfish Allergy Nausea And Vomiting     PERTINENT MEDICATIONS:  Outpatient Encounter Medications as of 12/01/2018  Medication Sig  . acetaminophen (TYLENOL) 500 MG tablet Take 500-1,000 mg by mouth See admin instructions. Take 500 mg by mouth in the morning/afternoon and take 1000 mg by mouth in the evening  . albuterol (PROVENTIL) (2.5 MG/3ML) 0.083% nebulizer solution USE 1 VIAL IN NEBULIZER EVERY 6 HOURS AS NEEDED FOR WHEEZING OR SHORTNESS OF BREATH  . ALPRAZolam (XANAX) 0.25 MG tablet Take 1 tablet (0.25 mg total) by mouth 2 (two) times daily as  needed for anxiety.  Marland Kitchen. aluminum-magnesium hydroxide-simethicone (MAALOX) 200-200-20 MG/5ML SUSP Take 30 mLs by mouth 4 (four) times daily -  before meals and at bedtime. (Patient taking differently: Take 30 mLs by mouth 4 (four) times daily as needed (for indegestion). )  . ASPERCREME LIDOCAINE EX Apply 1 application topically as needed (for hand/shoulder pain).  Marland Kitchen. aspirin 81 MG tablet Take 81 mg by mouth daily.  . budesonide (PULMICORT) 0.5 MG/2ML nebulizer solution Inhale 2 mLs into the lungs 2 (two) times daily.  . Cholecalciferol (VITAMIN D) 400 UNIT/ML LIQD Take by mouth. Pt taking pill form,   2 tablets daily  . docusate sodium (COLACE) 100 MG capsule Take 1 capsule (100 mg total) by mouth 2 (two) times daily.  Marland Kitchen. enoxaparin (LOVENOX) 40 MG/0.4ML injection Inject 0.4 mLs (40 mg total) into the skin daily for 14 days. X 14 days  . feeding supplement, ENSURE ENLIVE, (ENSURE ENLIVE) LIQD Take 237 mLs by mouth 2 (two) times daily between meals.  . ferrous sulfate 325 (65 FE) MG tablet Take 1 tablet (325 mg total) by mouth 2 (two) times daily with a meal.  . fluticasone furoate-vilanterol (BREO ELLIPTA) 200-25 MCG/INH AEPB Inhale 1 puff into the lungs daily.  . furosemide (LASIX) 40 MG tablet Take 40 mg by  mouth daily as needed for fluid.  Marland Kitchen. HYDROmorphone (DILAUDID) 2 MG tablet Take 1 tablet (2 mg total) by mouth every 6 (six) hours as needed for moderate pain or severe pain.  . methocarbamol (ROBAXIN) 750 MG tablet Take 1 tablet (750 mg total) by mouth 3 (three) times daily.  . Multiple Vitamin (MULTIVITAMIN WITH MINERALS) TABS tablet Take 1 tablet by mouth daily.  Marland Kitchen. nystatin cream (MYCOSTATIN) Apply 1 application topically 2 (two) times daily.  Bertram Gala. Polyethyl Glycol-Propyl Glycol (SYSTANE OP) Place 1 drop into both eyes daily.  . polyethylene glycol (MIRALAX) 17 g packet Take 17 g by mouth daily.  . potassium chloride SA (K-DUR,KLOR-CON) 20 MEQ tablet Take 20 mEq by mouth daily.   Marland Kitchen. PROAIR HFA 108  (90 Base) MCG/ACT inhaler Inhale 2 puffs into the lungs every 4 (four) hours as needed for wheezing or shortness of breath.   . sertraline (ZOLOFT) 25 MG tablet Take 50 mg by mouth daily.  . Tiotropium Bromide Monohydrate (SPIRIVA RESPIMAT) 2.5 MCG/ACT AERS INHALE 2 PUFFS INTO THE LUNGS BY DAILY  . triamterene-hydrochlorothiazide (MAXZIDE-25) 37.5-25 MG tablet Take 1 tablet by mouth daily.   No facility-administered encounter medications on file as of 12/01/2018.     PHYSICAL EXAM/ROS:   94-95% Oxygen  On 3 L  Current and past weights: 191 lbs from hospital  11/25/18  General: NAD, frail appearing,  Cardiovascular: no chest pain reported, no edema,  Pulmonary: no cough, c/o increased SOB , had xray that was clear, refused inhalers, not using nebs due at SNF due  to covid. Baseline COPD. Has 3 L/ [place] oxygen currently. Abdomen: appetite poor, min intake, endorses constipation, incontinent of bowel, is asking for applesauce, states choking on pineapple so staff started Mechanical soft diet, ST referral. GU: denies dysuria, incontinent of urine, has UTI and started on macrobid MSK:  Recent R femur fracture, kyphyoplasty,  At SNF for rehab but not participating in PT very much, some OT. H/o osteoporosis.  Skin: no rashes or wounds reported Neurological: Weakness, mentation changes  consistent with delirium, withdraws from visitors and family  Marijo FileKathryn M Kala Ambriz DNP, AGPCNP-BC

## 2018-12-02 ENCOUNTER — Inpatient Hospital Stay
Admission: EM | Admit: 2018-12-02 | Discharge: 2018-12-18 | DRG: 189 | Disposition: E | Payer: PPO | Source: Skilled Nursing Facility | Attending: Internal Medicine | Admitting: Internal Medicine

## 2018-12-02 ENCOUNTER — Encounter: Payer: Self-pay | Admitting: *Deleted

## 2018-12-02 ENCOUNTER — Other Ambulatory Visit: Payer: Self-pay

## 2018-12-02 ENCOUNTER — Emergency Department: Payer: PPO

## 2018-12-02 DIAGNOSIS — I272 Pulmonary hypertension, unspecified: Secondary | ICD-10-CM | POA: Diagnosis present

## 2018-12-02 DIAGNOSIS — N390 Urinary tract infection, site not specified: Secondary | ICD-10-CM | POA: Diagnosis not present

## 2018-12-02 DIAGNOSIS — Z7982 Long term (current) use of aspirin: Secondary | ICD-10-CM | POA: Diagnosis not present

## 2018-12-02 DIAGNOSIS — Z87891 Personal history of nicotine dependence: Secondary | ICD-10-CM | POA: Diagnosis not present

## 2018-12-02 DIAGNOSIS — Z66 Do not resuscitate: Secondary | ICD-10-CM | POA: Diagnosis not present

## 2018-12-02 DIAGNOSIS — I493 Ventricular premature depolarization: Secondary | ICD-10-CM | POA: Diagnosis present

## 2018-12-02 DIAGNOSIS — J9601 Acute respiratory failure with hypoxia: Principal | ICD-10-CM | POA: Diagnosis present

## 2018-12-02 DIAGNOSIS — Z7951 Long term (current) use of inhaled steroids: Secondary | ICD-10-CM

## 2018-12-02 DIAGNOSIS — R0689 Other abnormalities of breathing: Secondary | ICD-10-CM | POA: Diagnosis not present

## 2018-12-02 DIAGNOSIS — Z9981 Dependence on supplemental oxygen: Secondary | ICD-10-CM

## 2018-12-02 DIAGNOSIS — I890 Lymphedema, not elsewhere classified: Secondary | ICD-10-CM | POA: Diagnosis present

## 2018-12-02 DIAGNOSIS — J9602 Acute respiratory failure with hypercapnia: Secondary | ICD-10-CM | POA: Diagnosis not present

## 2018-12-02 DIAGNOSIS — R404 Transient alteration of awareness: Secondary | ICD-10-CM | POA: Diagnosis not present

## 2018-12-02 DIAGNOSIS — R4020 Unspecified coma: Secondary | ICD-10-CM | POA: Diagnosis not present

## 2018-12-02 DIAGNOSIS — Z79899 Other long term (current) drug therapy: Secondary | ICD-10-CM

## 2018-12-02 DIAGNOSIS — I10 Essential (primary) hypertension: Secondary | ICD-10-CM | POA: Diagnosis not present

## 2018-12-02 DIAGNOSIS — Z880 Allergy status to penicillin: Secondary | ICD-10-CM | POA: Diagnosis not present

## 2018-12-02 DIAGNOSIS — I4519 Other right bundle-branch block: Secondary | ICD-10-CM | POA: Diagnosis not present

## 2018-12-02 DIAGNOSIS — Z515 Encounter for palliative care: Secondary | ICD-10-CM | POA: Diagnosis present

## 2018-12-02 DIAGNOSIS — J9811 Atelectasis: Secondary | ICD-10-CM | POA: Diagnosis not present

## 2018-12-02 DIAGNOSIS — J439 Emphysema, unspecified: Secondary | ICD-10-CM | POA: Diagnosis present

## 2018-12-02 DIAGNOSIS — J180 Bronchopneumonia, unspecified organism: Secondary | ICD-10-CM | POA: Diagnosis not present

## 2018-12-02 DIAGNOSIS — Z885 Allergy status to narcotic agent status: Secondary | ICD-10-CM

## 2018-12-02 DIAGNOSIS — J9600 Acute respiratory failure, unspecified whether with hypoxia or hypercapnia: Secondary | ICD-10-CM | POA: Diagnosis present

## 2018-12-02 DIAGNOSIS — Z20828 Contact with and (suspected) exposure to other viral communicable diseases: Secondary | ICD-10-CM | POA: Diagnosis present

## 2018-12-02 DIAGNOSIS — J441 Chronic obstructive pulmonary disease with (acute) exacerbation: Secondary | ICD-10-CM | POA: Diagnosis not present

## 2018-12-02 DIAGNOSIS — R0902 Hypoxemia: Secondary | ICD-10-CM | POA: Diagnosis not present

## 2018-12-02 DIAGNOSIS — Z888 Allergy status to other drugs, medicaments and biological substances status: Secondary | ICD-10-CM

## 2018-12-02 DIAGNOSIS — A419 Sepsis, unspecified organism: Secondary | ICD-10-CM

## 2018-12-02 LAB — URINALYSIS, COMPLETE (UACMP) WITH MICROSCOPIC
Bilirubin Urine: NEGATIVE
Glucose, UA: NEGATIVE mg/dL
Ketones, ur: 5 mg/dL — AB
Nitrite: NEGATIVE
Protein, ur: 100 mg/dL — AB
Specific Gravity, Urine: 1.014 (ref 1.005–1.030)
WBC, UA: >50 WBC/hpf — ABNORMAL HIGH (ref 0–5)
pH: 7 (ref 5.0–8.0)

## 2018-12-02 LAB — PROTIME-INR
INR: 1 (ref 0.8–1.2)
Prothrombin Time: 13.2 seconds (ref 11.4–15.2)

## 2018-12-02 LAB — CBC WITH DIFFERENTIAL/PLATELET
Abs Immature Granulocytes: 0.06 10*3/uL (ref 0.00–0.07)
Basophils Absolute: 0 10*3/uL (ref 0.0–0.1)
Basophils Relative: 0 %
Eosinophils Absolute: 0 10*3/uL (ref 0.0–0.5)
Eosinophils Relative: 0 %
HCT: 38.3 % (ref 36.0–46.0)
Hemoglobin: 11.5 g/dL — ABNORMAL LOW (ref 12.0–15.0)
Immature Granulocytes: 1 %
Lymphocytes Relative: 7 %
Lymphs Abs: 0.7 10*3/uL (ref 0.7–4.0)
MCH: 30.9 pg (ref 26.0–34.0)
MCHC: 30 g/dL (ref 30.0–36.0)
MCV: 103 fL — ABNORMAL HIGH (ref 80.0–100.0)
Monocytes Absolute: 1.1 10*3/uL — ABNORMAL HIGH (ref 0.1–1.0)
Monocytes Relative: 11 %
Neutro Abs: 7.9 10*3/uL — ABNORMAL HIGH (ref 1.7–7.7)
Neutrophils Relative %: 81 %
Platelets: 309 10*3/uL (ref 150–400)
RBC: 3.72 MIL/uL — ABNORMAL LOW (ref 3.87–5.11)
RDW: 14.7 % (ref 11.5–15.5)
WBC: 9.8 10*3/uL (ref 4.0–10.5)
nRBC: 0 % (ref 0.0–0.2)

## 2018-12-02 LAB — C-REACTIVE PROTEIN: CRP: 7.4 mg/dL — ABNORMAL HIGH (ref <1.0)

## 2018-12-02 LAB — COMPREHENSIVE METABOLIC PANEL
ALT: 22 U/L (ref 0–44)
AST: 23 U/L (ref 15–41)
Albumin: 3.2 g/dL — ABNORMAL LOW (ref 3.5–5.0)
Alkaline Phosphatase: 110 U/L (ref 38–126)
Anion gap: 11 (ref 5–15)
BUN: 18 mg/dL (ref 8–23)
CO2: 46 mmol/L — ABNORMAL HIGH (ref 22–32)
Calcium: 8.7 mg/dL — ABNORMAL LOW (ref 8.9–10.3)
Chloride: 84 mmol/L — ABNORMAL LOW (ref 98–111)
Creatinine, Ser: 0.7 mg/dL (ref 0.44–1.00)
GFR calc Af Amer: >60 mL/min (ref >60)
GFR calc non Af Amer: >60 mL/min (ref >60)
Glucose, Bld: 146 mg/dL — ABNORMAL HIGH (ref 70–99)
Potassium: 3.5 mmol/L (ref 3.5–5.1)
Sodium: 141 mmol/L (ref 135–145)
Total Bilirubin: 1.8 mg/dL — ABNORMAL HIGH (ref 0.3–1.2)
Total Protein: 6.1 g/dL — ABNORMAL LOW (ref 6.5–8.1)

## 2018-12-02 LAB — BLOOD GAS, ARTERIAL
Acid-Base Excess: 27.8 mmol/L — ABNORMAL HIGH (ref 0.0–2.0)
Bicarbonate: 58.9 mmol/L — ABNORMAL HIGH (ref 20.0–28.0)
FIO2: 1
O2 Saturation: 97.7 %
Patient temperature: 37
pCO2 arterial: 93 mmHg (ref 32.0–48.0)
pH, Arterial: 7.41 (ref 7.350–7.450)
pO2, Arterial: 99 mmHg (ref 83.0–108.0)

## 2018-12-02 LAB — PROCALCITONIN: Procalcitonin: 0.11 ng/mL

## 2018-12-02 LAB — LACTATE DEHYDROGENASE: LDH: 173 U/L (ref 98–192)

## 2018-12-02 LAB — LIPASE, BLOOD: Lipase: 21 U/L (ref 11–51)

## 2018-12-02 LAB — FIBRIN DERIVATIVES D-DIMER (ARMC ONLY): Fibrin derivatives D-dimer (ARMC): 2345.33 ng/mL (FEU) — ABNORMAL HIGH (ref 0.00–499.00)

## 2018-12-02 LAB — TRIGLYCERIDES: Triglycerides: 121 mg/dL (ref <150)

## 2018-12-02 LAB — SARS CORONAVIRUS 2 BY RT PCR (HOSPITAL ORDER, PERFORMED IN ~~LOC~~ HOSPITAL LAB): SARS Coronavirus 2: NEGATIVE

## 2018-12-02 LAB — FIBRINOGEN: Fibrinogen: 566 mg/dL — ABNORMAL HIGH (ref 210–475)

## 2018-12-02 LAB — LACTIC ACID, PLASMA: Lactic Acid, Venous: 1.4 mmol/L (ref 0.5–1.9)

## 2018-12-02 LAB — BRAIN NATRIURETIC PEPTIDE: B Natriuretic Peptide: 238 pg/mL — ABNORMAL HIGH (ref 0.0–100.0)

## 2018-12-02 LAB — FERRITIN: Ferritin: 145 ng/mL (ref 11–307)

## 2018-12-02 MED ORDER — ENOXAPARIN SODIUM 40 MG/0.4ML ~~LOC~~ SOLN: 40.0000 mg | SUBCUTANEOUS | Status: DC

## 2018-12-02 MED ORDER — IPRATROPIUM-ALBUTEROL 0.5-2.5 (3) MG/3ML IN SOLN: 3.0000 mL | Freq: Four times a day (QID) | RESPIRATORY_TRACT | Status: DC

## 2018-12-02 MED ORDER — ONDANSETRON HCL 4 MG PO TABS: 4.0000 mg | ORAL_TABLET | Freq: Four times a day (QID) | ORAL | Status: DC | PRN

## 2018-12-02 MED ORDER — LIDOCAINE 5 % EX PTCH: 1.0000 | MEDICATED_PATCH | CUTANEOUS | Status: DC | PRN

## 2018-12-02 MED ORDER — IPRATROPIUM-ALBUTEROL 0.5-2.5 (3) MG/3ML IN SOLN
3.0000 mL | Freq: Once | RESPIRATORY_TRACT | Status: AC
  Administered 2018-12-02: 3 mL via RESPIRATORY_TRACT

## 2018-12-02 MED ORDER — ASPIRIN 81 MG PO CHEW: 81.0000 mg | CHEWABLE_TABLET | Freq: Every day | ORAL | Status: DC

## 2018-12-02 MED ORDER — FUROSEMIDE 40 MG PO TABS: 40.0000 mg | ORAL_TABLET | Freq: Every day | ORAL | Status: DC | PRN

## 2018-12-02 MED ORDER — LORAZEPAM 1 MG PO TABS: 1.0000 mg | ORAL_TABLET | ORAL | Status: DC | PRN

## 2018-12-02 MED ORDER — ADULT MULTIVITAMIN W/MINERALS CH: 1.0000 | ORAL_TABLET | Freq: Every day | ORAL | Status: DC

## 2018-12-02 MED ORDER — POTASSIUM CHLORIDE CRYS ER 20 MEQ PO TBCR: 20.0000 meq | EXTENDED_RELEASE_TABLET | Freq: Every day | ORAL | Status: DC

## 2018-12-02 MED ORDER — VANCOMYCIN HCL IN DEXTROSE 1-5 GM/200ML-% IV SOLN: 1000.0000 mg | INTRAVENOUS | Status: DC

## 2018-12-02 MED ORDER — MORPHINE SULFATE (CONCENTRATE) 10 MG/0.5ML PO SOLN
5.0000 mg | ORAL | Status: DC | PRN
  Administered 2018-12-02: 5 mg via ORAL
  Filled 2018-12-02: qty 0.5
  Filled 2018-12-02: qty 1

## 2018-12-02 MED ORDER — MORPHINE SULFATE (PF) 4 MG/ML IV SOLN: 4.0000 mg | INTRAVENOUS | Status: DC | PRN

## 2018-12-02 MED ORDER — MAGNESIUM HYDROXIDE 400 MG/5ML PO SUSP: 30.0000 mL | Freq: Every day | ORAL | Status: DC | PRN

## 2018-12-02 MED ORDER — BUDESONIDE 0.5 MG/2ML IN SUSP
2.0000 mL | Freq: Two times a day (BID) | RESPIRATORY_TRACT | Status: DC
  Filled 2018-12-02: qty 2

## 2018-12-02 MED ORDER — ACETAMINOPHEN 650 MG RE SUPP: 650.0000 mg | Freq: Four times a day (QID) | RECTAL | Status: DC | PRN

## 2018-12-02 MED ORDER — SODIUM CHLORIDE 0.9 % IV SOLN
2.0000 g | Freq: Once | INTRAVENOUS | Status: AC
  Administered 2018-12-02: 2 g via INTRAVENOUS
  Filled 2018-12-02: qty 2

## 2018-12-02 MED ORDER — GUAIFENESIN ER 600 MG PO TB12: 600.0000 mg | ORAL_TABLET | Freq: Two times a day (BID) | ORAL | Status: DC

## 2018-12-02 MED ORDER — POLYVINYL ALCOHOL 1.4 % OP SOLN
Freq: Every day | OPHTHALMIC | Status: DC
  Filled 2018-12-02: qty 15

## 2018-12-02 MED ORDER — VANCOMYCIN HCL IN DEXTROSE 1-5 GM/200ML-% IV SOLN: 1000.0000 mg | Freq: Once | INTRAVENOUS | Status: DC

## 2018-12-02 MED ORDER — IPRATROPIUM-ALBUTEROL 0.5-2.5 (3) MG/3ML IN SOLN
3.0000 mL | Freq: Once | RESPIRATORY_TRACT | Status: AC
  Administered 2018-12-02: 3 mL via RESPIRATORY_TRACT
  Filled 2018-12-02: qty 3

## 2018-12-02 MED ORDER — ACETAMINOPHEN 650 MG RE SUPP
650.0000 mg | Freq: Four times a day (QID) | RECTAL | Status: DC | PRN
  Administered 2018-12-02: 650 mg via RECTAL

## 2018-12-02 MED ORDER — HYDROMORPHONE HCL 2 MG PO TABS: 2.0000 mg | ORAL_TABLET | Freq: Four times a day (QID) | ORAL | Status: DC | PRN

## 2018-12-02 MED ORDER — TRAZODONE HCL 50 MG PO TABS: 25.0000 mg | ORAL_TABLET | Freq: Every evening | ORAL | Status: DC | PRN

## 2018-12-02 MED ORDER — SCOPOLAMINE 1 MG/3DAYS TD PT72
1.0000 | MEDICATED_PATCH | TRANSDERMAL | Status: DC
  Administered 2018-12-02: 17:00:00 1.5 mg via TRANSDERMAL
  Filled 2018-12-02: qty 1

## 2018-12-02 MED ORDER — MORPHINE SULFATE (CONCENTRATE) 10 MG/0.5ML PO SOLN
5.0000 mg | ORAL | Status: DC | PRN
  Administered 2018-12-02 – 2018-12-03 (×4): 5 mg via SUBLINGUAL
  Filled 2018-12-02: qty 1
  Filled 2018-12-02: qty 0.5
  Filled 2018-12-02 (×3): qty 1

## 2018-12-02 MED ORDER — AZTREONAM 1 G IJ SOLR: 1.0000 g | Freq: Three times a day (TID) | INTRAMUSCULAR | Status: DC

## 2018-12-02 MED ORDER — SERTRALINE HCL 50 MG PO TABS: 50.0000 mg | ORAL_TABLET | Freq: Every day | ORAL | Status: DC

## 2018-12-02 MED ORDER — SODIUM CHLORIDE 0.9 % IV SOLN: 2.0000 g | Freq: Three times a day (TID) | INTRAVENOUS | Status: DC

## 2018-12-02 MED ORDER — ALPRAZOLAM 0.5 MG PO TABS: 0.2500 mg | ORAL_TABLET | Freq: Two times a day (BID) | ORAL | Status: DC | PRN

## 2018-12-02 MED ORDER — DOCUSATE SODIUM 100 MG PO CAPS: 100.0000 mg | ORAL_CAPSULE | Freq: Two times a day (BID) | ORAL | Status: DC

## 2018-12-02 MED ORDER — LORAZEPAM 2 MG/ML IJ SOLN
1.0000 mg | INTRAMUSCULAR | Status: DC | PRN
  Administered 2018-12-02: 23:00:00 1 mg via INTRAVENOUS
  Filled 2018-12-02: qty 1

## 2018-12-02 MED ORDER — METHOCARBAMOL 750 MG PO TABS
750.0000 mg | ORAL_TABLET | Freq: Three times a day (TID) | ORAL | Status: DC
  Filled 2018-12-02: qty 1

## 2018-12-02 MED ORDER — METHYLPREDNISOLONE SODIUM SUCC 125 MG IJ SOLR: 60.0000 mg | Freq: Three times a day (TID) | INTRAMUSCULAR | Status: DC

## 2018-12-02 MED ORDER — METHYLPREDNISOLONE SODIUM SUCC 125 MG IJ SOLR
125.0000 mg | Freq: Once | INTRAMUSCULAR | Status: AC
  Administered 2018-12-02: 125 mg via INTRAVENOUS
  Filled 2018-12-02: qty 2

## 2018-12-02 MED ORDER — TRIAMTERENE-HCTZ 37.5-25 MG PO TABS
1.0000 | ORAL_TABLET | Freq: Every day | ORAL | Status: DC
  Filled 2018-12-02: qty 1

## 2018-12-02 MED ORDER — CHOLECALCIFEROL 10 MCG (400 UNIT) PO TABS
800.0000 [IU] | ORAL_TABLET | Freq: Every day | ORAL | Status: DC
  Filled 2018-12-02: qty 2

## 2018-12-02 MED ORDER — FERROUS SULFATE 325 (65 FE) MG PO TABS
325.0000 mg | ORAL_TABLET | Freq: Two times a day (BID) | ORAL | Status: DC
  Filled 2018-12-02: qty 1

## 2018-12-02 MED ORDER — ACETAMINOPHEN 325 MG PO TABS: 650.0000 mg | ORAL_TABLET | Freq: Four times a day (QID) | ORAL | Status: DC | PRN

## 2018-12-02 MED ORDER — PREDNISONE 20 MG PO TABS: 40.0000 mg | ORAL_TABLET | Freq: Every day | ORAL | Status: DC

## 2018-12-02 MED ORDER — ONDANSETRON HCL 4 MG/2ML IJ SOLN: 4.0000 mg | Freq: Four times a day (QID) | INTRAMUSCULAR | Status: DC | PRN

## 2018-12-02 MED ORDER — LORAZEPAM 2 MG/ML PO CONC
1.0000 mg | ORAL | Status: DC | PRN
  Filled 2018-12-02: qty 0.5

## 2018-12-02 MED ORDER — GLYCOPYRROLATE 0.2 MG/ML IJ SOLN
0.1000 mg | Freq: Four times a day (QID) | INTRAMUSCULAR | Status: DC | PRN
  Administered 2018-12-02 (×2): 0.1 mg via INTRAVENOUS
  Filled 2018-12-02 (×4): qty 0.5

## 2018-12-02 MED ORDER — ENSURE ENLIVE PO LIQD: 237.0000 mL | Freq: Two times a day (BID) | ORAL | Status: DC

## 2018-12-02 MED ORDER — SODIUM CHLORIDE 0.9 % IV SOLN: INTRAVENOUS | Status: DC

## 2018-12-02 MED ORDER — ONDANSETRON 4 MG PO TBDP
4.0000 mg | ORAL_TABLET | Freq: Four times a day (QID) | ORAL | Status: DC | PRN
  Filled 2018-12-02: qty 1

## 2018-12-02 MED ORDER — POLYETHYLENE GLYCOL 3350 17 G PO PACK: 17.0000 g | PACK | Freq: Every day | ORAL | Status: DC

## 2018-12-02 MED ORDER — ALUM & MAG HYDROXIDE-SIMETH 200-200-20 MG/5ML PO SUSP: 30.0000 mL | Freq: Three times a day (TID) | ORAL | Status: DC

## 2018-12-02 MED ORDER — SODIUM CHLORIDE 0.9 % IV BOLUS (SEPSIS)
1000.0000 mL | Freq: Once | INTRAVENOUS | Status: AC
  Administered 2018-12-02: 05:00:00 1000 mL via INTRAVENOUS

## 2018-12-02 MED ORDER — VANCOMYCIN HCL 1.5 G IV SOLR
1500.0000 mg | Freq: Once | INTRAVENOUS | Status: AC
  Administered 2018-12-02: 1500 mg via INTRAVENOUS
  Filled 2018-12-02: qty 1500

## 2018-12-02 NOTE — ED Provider Notes (Signed)
[facility] Emergency Department Provider Note  ____________________________________________   First MD Initiated Contact with Patient 11/27/2018 [phone]     (approximate)  I have reviewed the triage vital signs and the nursing notes.   HISTORY  Chief Complaint Respiratory Distress  Level 5 caveat:  history/ROS limited by acute/critical illness  HPI Sarah Mckee is a 73 y.o. female with a recent history of orthopedic surgery for a hip fracture who has [facility].  She presents tonight for evaluation of acute respiratory failure and unresponsiveness.  According to EMS, 911 was called because the patient was having trouble breathing.  When EMS arrived, the patient had a mask around her chin with 2 L of oxygen and she was unresponsive and had an oxygen saturation of 50%.  They placed her on a nonrebreather and brought her immediately to the emergency department.  By the time she arrived she had an oxygenation of around 98% on nonrebreather at 15 L but she was still unresponsive.  Little additional information was provided except that she had a MOST form indicating that she was DNR but it was not filled out by a physician or signed.  I initiated a septic work-up and reviewed the medical record and contacted the patient's daughter and healthcare power of attorney for more details.  See hospital course for more information.         Past Medical History:  Diagnosis Date  . Arthritis   . COPD (chronic obstructive pulmonary disease) (HCC)    2L 02 at baseline  . Hypertension   . Lymphedema of left leg   . Lymphedema of right lower extremity   . Pulmonary HTN [facility])     Patient Active Problem List   Diagnosis Date Noted  . Closed right hip fracture (HCC) 11/18/2018  . COPD with acute exacerbation (HCC) 06/27/2017  . Solitary pulmonary nodule-LLL 04/17/2016  . COPD exacerbation (HCC) 11/06/2015  . Chronic respiratory failure (HCC) 10/03/2015   . COPD with hypoxia (HCC) 10/03/2015  . Acute respiratory failure (HCC) 08/11/2015    Past Surgical History:  Procedure Laterality Date  . CATARACT EXTRACTION W/PHACO Left 04/28/2017   Procedure: CATARACT EXTRACTION PHACO AND INTRAOCULAR LENS PLACEMENT (IOC);  Surgeon: Galen Manila, MD;  Location: [facility] ORS;  Service: Ophthalmology;  Laterality: Left;  [place] 01:00.3 AP% 19.7 CDE 11.88 Fluid Pack lot # [id number]  . INTRAMEDULLARY (IM) NAIL INTERTROCHANTERIC Right 11/18/2018   Procedure: INTRAMEDULLARY (IM) NAIL INTERTROCHANTRIC;  Surgeon: Kennedy Bucker, MD;  Location: [facility] ORS;  Service: Orthopedics;  Laterality: Right;  . KYPHOPLASTY N/A 11/25/2018   Procedure: KYPHOPLASTY L3-L4;  Surgeon: Kennedy Bucker, MD;  Location: [facility] ORS;  Service: Orthopedics;  Laterality: N/A;  . none    . PARS PLANA VITRECTOMY W/ REPAIR OF MACULAR HOLE    . TUBAL LIGATION      Prior to Admission medications   Medication Sig Start Date End Date Taking? Authorizing Provider  acetaminophen (TYLENOL) 500 MG tablet Take 500-1,000 mg by mouth See admin instructions. Take 500 mg by mouth in the morning/afternoon and take 1000 mg by mouth in the evening    [provider]  albuterol (PROVENTIL) (2.5 MG/3ML) 0.083% nebulizer solution USE 1 VIAL IN NEBULIZER EVERY 6 HOURS AS NEEDED FOR WHEEZING OR SHORTNESS OF BREATH 03/19/18   Erin Fulling, MD  ALPRAZolam Prudy Feeler) 0.25 MG tablet Take 1 tablet (0.25 mg total) by mouth 2 (two) times daily as needed for anxiety. 11/26/18  Altamese DillingVachhani, Vaibhavkumar, MD  aluminum-magnesium hydroxide-simethicone (MAALOX) 200-200-20 MG/5ML SUSP Take 30 mLs by mouth 4 (four) times daily -  before meals and at bedtime. Patient taking differently: Take 30 mLs by mouth 4 (four) times daily as needed (for indegestion).  04/01/16   Sharman CheekStafford, Phillip, MD  ASPERCREME LIDOCAINE EX Apply 1 application topically as needed (for hand/shoulder pain).    [provider]  aspirin 81 MG tablet Take  81 mg by mouth daily.    [provider]  budesonide (PULMICORT) 0.5 MG/2ML nebulizer solution Inhale 2 mLs into the lungs 2 (two) times daily. 12/29/17 12/29/18  [provider]  Cholecalciferol (VITAMIN D) 400 UNIT/ML LIQD Take by mouth. Pt taking pill form,   2 tablets daily    [provider]  docusate sodium (COLACE) 100 MG capsule Take 1 capsule (100 mg total) by mouth 2 (two) times daily. 11/26/18   Altamese DillingVachhani, Vaibhavkumar, MD  enoxaparin (LOVENOX) 40 MG/0.4ML injection Inject 0.4 mLs (40 mg total) into the skin daily for 14 days. X 14 days 11/23/18 12/07/18  Evon SlackGaines, Thomas C, PA-C  feeding supplement, ENSURE ENLIVE, (ENSURE ENLIVE) LIQD Take 237 mLs by mouth 2 (two) times daily between meals. 11/26/18   Altamese DillingVachhani, Vaibhavkumar, MD  ferrous sulfate 325 (65 FE) MG tablet Take 1 tablet (325 mg total) by mouth 2 (two) times daily with a meal. 11/26/18   Altamese DillingVachhani, Vaibhavkumar, MD  fluticasone furoate-vilanterol (BREO ELLIPTA) 200-25 MCG/INH AEPB Inhale 1 puff into the lungs daily. 07/26/18   Erin FullingKasa, Kurian, MD  furosemide (LASIX) 40 MG tablet Take 40 mg by mouth daily as needed for fluid.    [provider]  HYDROmorphone (DILAUDID) 2 MG tablet Take 1 tablet (2 mg total) by mouth every 6 (six) hours as needed for moderate pain or severe pain. 11/26/18   Altamese DillingVachhani, Vaibhavkumar, MD  methocarbamol (ROBAXIN) 750 MG tablet Take 1 tablet (750 mg total) by mouth 3 (three) times daily. 11/26/18   Altamese DillingVachhani, Vaibhavkumar, MD  Multiple Vitamin (MULTIVITAMIN WITH MINERALS) TABS tablet Take 1 tablet by mouth daily. 11/27/18   Altamese DillingVachhani, Vaibhavkumar, MD  nystatin cream (MYCOSTATIN) Apply 1 application topically 2 (two) times daily. 01/14/18 01/14/19  [provider]  Polyethyl Glycol-Propyl Glycol (SYSTANE OP) Place 1 drop into both eyes daily.    [provider]  polyethylene glycol (MIRALAX) 17 g packet Take 17 g by mouth daily. 11/26/18   Altamese DillingVachhani, Vaibhavkumar, MD   potassium chloride SA (K-DUR,KLOR-CON) 20 MEQ tablet Take 20 mEq by mouth daily.     [provider]  PROAIR HFA 108 [phone](90 Base) MCG/ACT inhaler Inhale 2 puffs into the lungs every 4 (four) hours as needed for wheezing or shortness of breath.  06/13/15   [provider]  sertraline (ZOLOFT) 25 MG tablet Take 50 mg by mouth daily. 01/14/18 01/14/19  [provider]  Tiotropium Bromide Monohydrate (SPIRIVA RESPIMAT) 2.5 MCG/ACT AERS INHALE 2 PUFFS INTO THE LUNGS BY DAILY 07/07/18   Erin FullingKasa, Kurian, MD  triamterene-hydrochlorothiazide (MAXZIDE-25) 37.5-25 MG tablet Take 1 tablet by mouth daily. 08/06/15   [provider]    Allergies Codeine, Demerol [meperidine], Morphine and related, Oxycodone, Alendronate, Amoxicillin-pot clavulanate, Risedronate, and Shellfish allergy  Family History  Problem Relation Age of Onset  . Heart attack Mother   . Diabetes Mother   . Cancer Father   . Thyroid disease Father   . Diabetes Sister   . Diabetes Brother     Social History Social History   Tobacco  Use  . Smoking status: Former Smoker    Quit date: 05/20/2007    Years since quitting: 11.5  . Smokeless tobacco: Never Used  Substance Use Topics  . Alcohol use: No  . Drug use: No    Review of Systems Level 5 caveat:  history/ROS limited by acute/critical illness   ____________________________________________   PHYSICAL EXAM:  VITAL SIGNS: ED Triage Vitals  Enc Vitals Group     BP 12/04/2018 0400 (!) 143/63     Pulse Rate 11/18/2018 0400 (!) 104     Resp 11/19/2018 0402 (!) 27     Temp 12/15/2018 0430 98.2 F (36.8 C)     Temp Source 12/01/2018 0434 Rectal     SpO2 12/08/2018 0400 99 %     Weight --      Height --      Head Circumference --      Peak Flow --      Pain Score --      Pain Loc --      Pain Edu? --      Excl. in GC? --     Constitutional: Obtunded, appears critically ill, minimal response to painful stimuli with only a fluttering of her eyelids.  Eyes: Conjunctivae are normal.  Head: Atraumatic. Nose: No congestion/rhinnorhea. Mouth/Throat: Mucous membranes are moist. Neck: No stridor.  No meningeal signs.   Cardiovascular: Mild tachycardia, regular rhythm. Good peripheral circulation. Grossly normal heart sounds. Respiratory: Tachypnea, profound hypoxemia on room air, 99% on 15 L nonrebreather, some accessory muscle usage, no wheezing and generally clear breath sounds but decreased throughout. Gastrointestinal: Soft and nontender. No distention.  Musculoskeletal: Chronic lymphedema according the medical record.  No evidence of active infection. Neurologic: Unable to participate in neurological exam, protecting airway and has some corneal reflex but otherwise unresponsive. Skin: Pallor.  Extensive bruising throughout her skin surface.  She has a bandage on the right side of her upper chest just below her neck that appears to be a wound that is undergoing care at the facility.   ____________________________________________   LABS (all labs ordered are listed, but only abnormal results are displayed)  Labs Reviewed  COMPREHENSIVE METABOLIC PANEL - Abnormal; Notable for the following components:      Result Value   Chloride 84 (*)    CO2 46 (*)    Glucose, Bld 146 (*)    Calcium 8.7 (*)    Total Protein 6.1 (*)    Albumin 3.2 (*)    Total Bilirubin 1.8 (*)    All other components within normal limits  BRAIN NATRIURETIC PEPTIDE - Abnormal; Notable for the following components:   B Natriuretic Peptide 238.0 (*)    All other components within normal limits  CBC WITH DIFFERENTIAL/PLATELET - Abnormal; Notable for the following components:   RBC 3.72 (*)    Hemoglobin 11.5 (*)    MCV 103.0 (*)    Neutro Abs 7.9 (*)    Monocytes Absolute 1.1 (*)    All other components within normal limits  URINALYSIS, COMPLETE (UACMP) WITH MICROSCOPIC - Abnormal; Notable for the following components:   Color, Urine YELLOW (*)    APPearance  TURBID (*)    Hgb urine dipstick MODERATE (*)    Ketones, ur 5 (*)    Protein, ur 100 (*)    Leukocytes,Ua MODERATE (*)    WBC, UA >50 (*)    Bacteria, UA MANY (*)    All other components within normal limits  BLOOD GAS,  ARTERIAL - Abnormal; Notable for the following components:   pCO2 arterial 93 (*)    Bicarbonate 58.9 (*)    Acid-Base Excess 27.8 (*)    All other components within normal limits  FIBRIN DERIVATIVES D-DIMER ([facility] ONLY) - Abnormal; Notable for the following components:   Fibrin derivatives D-dimer [facility] 2,345.33 (*)    All other components within normal limits  FIBRINOGEN - Abnormal; Notable for the following components:   Fibrinogen 566 (*)    All other components within normal limits  SARS CORONAVIRUS 2 (HOSPITAL ORDER, PERFORMED IN [facility])  CULTURE, BLOOD (ROUTINE X 2)  CULTURE, BLOOD (ROUTINE X 2)  URINE CULTURE  LACTIC ACID, PLASMA  LIPASE, BLOOD  PROCALCITONIN  PROTIME-INR  LACTATE DEHYDROGENASE  FERRITIN  TRIGLYCERIDES  C-REACTIVE PROTEIN  HIV ANTIBODY (ROUTINE TESTING W REFLEX)   ____________________________________________  EKG  ED ECG REPORT I, Loleta Roseory Kammy Klett, the attending physician, personally viewed and interpreted this ECG.  Date: 08/28/18 EKG Time: 4:08 AM Rate: 104 Rhythm: Sinus tachycardia with occasional PVCs QRS Axis: normal Intervals: Incomplete right bundle branch block ST/T Wave abnormalities: Non-specific ST segment / T-wave changes, but no clear evidence of acute ischemia. Narrative Interpretation: no definitive evidence of acute ischemia; does not meet STEMI criteria.   ____________________________________________  RADIOLOGY I, Loleta Roseory Kiam Bransfield, personally viewed and evaluated these images (plain radiographs) as part of my medical decision making, as well as reviewing the written report by the radiologist.  ED MD interpretation: Probable atelectasis, possible bronchopneumonia  Official radiology  report(s): Dg Chest Port 1 View  Result Date: 04/03/19 CLINICAL DATA:  Sepsis EXAM: PORTABLE CHEST 1 VIEW COMPARISON:  11/17/2018 FINDINGS: Streaky density in the lower lungs where airways appear thickened. Normal heart size and mediastinal contours. Emphysema. No effusion or pneumothorax IMPRESSION: 1. Lower lung opacities with streaky appearance favoring atelectasis. There may be underlying bronchitis/bronchopneumonia. 2. Emphysema. Electronically Signed   By: Marnee SpringJonathon  Watts M.D.   On: 08/28/18 04:43    ____________________________________________   PROCEDURES   Procedure(s) performed (including Critical Care):  .Critical Care Performed by: Loleta RoseForbach, Autrey Human, MD Authorized by: Loleta RoseForbach, Bekim Werntz, MD   Critical care provider statement:    Critical care time (minutes):  75   Critical care time was exclusive of:  Separately billable procedures and treating other patients   Critical care was necessary to treat or prevent imminent or life-threatening deterioration of the following conditions:  Respiratory failure and sepsis   Critical care was time spent personally by me on the following activities:  Development of treatment plan with patient or surrogate, discussions with consultants, evaluation of patient's response to treatment, examination of patient, obtaining history from patient or surrogate, ordering and performing treatments and interventions, ordering and review of laboratory studies, ordering and review of radiographic studies, pulse oximetry, re-evaluation of patient's condition and review of old charts     ____________________________________________   INITIAL IMPRESSION / MDM / ASSESSMENT AND PLAN / ED COURSE  As part of my medical decision making, I reviewed the following data within the electronic MEDICAL RECORD NUMBER History obtained from family, Nursing notes reviewed and incorporated, Labs reviewed , EKG interpreted , Old chart reviewed, Radiograph reviewed , Discussed with  admitting physician  and Notes from prior ED visits   Differential diagnosis includes, but is not limited to, sepsis, urinary tract infection, healthcare associated pneumonia, wound infection, PE, COPD exacerbation, ACS.  The patient's primary issue at the moment is respiratory failure but she has a possible advanced directive.  After making sure that she was protecting her airway and satting in the upper 90s but still unresponsive, I looked at her medical record and saw that she had a palliative care consult yesterday at her nursing facility that confirmed her DNR and DNI status.  Apparently she has been gradually getting worse over the last 2 weeks since her surgery.  I will attempt to contact her daughter, Wynona CanesChristine, who is her healthcare power of attorney to discuss goals of care.  At this point I anticipate that the patient may require at least a BiPAP.  I also confirmed that she has a history of COPD on 2 L of oxygen chronically and I suspect she also has an issue of hypercapnia due to hypoventilation as well as her hypoxia.  Arterial blood gases pending.      Clinical Course as of Dec 02 727  Thu Dec 02, 2018  0400 Substantial hypercapnia 93, PO2 is correcting with nonrebreather.  I am starting BiPAP for her hypo-ventilation and hypercapnia.  pCO2 arterial(!!): 93 [CF]  0430 Spoke by phone with Wynona CanesChristine, the patient's daughter and HCPOA.  She confirmed DNR/DNI status and agrees with plan for Bipap, empiric abx, and admission.  Given patient's grave prognosis, I am giving permission for end-of-life visitation by the daughter, although it is possible she may improve after Bipap and antibiotics for probable UTI.   [CF]  0449 Atelectasis vs bronchopneumonia  [facility] [CF]  [phone] Grossly infected urine, empiric antibiotics already selected should be appropriate  Urinalysis, Complete w Microscopic(!) [CF]  0457 Procalcitonin: 0.11 [CF]  0457 Lactic Acid, Venous: 1.4 [CF]  0527 SARS  Coronavirus 2: NEGATIVE [CF]  0542 I discussed the case with Dr. Arville CareMansy with the hospitalist service who will admit.   [CF]  0602 I was alerted by the respiratory therapist that the patient's oxygen saturations dropping in spite of being on BiPAP at 100% and with a good waveform.  I went in to check on her and her daughter and her husband is at bedside.  The patient is not in distress but is unresponsive.  Her oxygenation is in fact about 85% with a good waveform.  We have increased the inspiratory pressure and the patient does have lung sounds bilaterally but I suspect that she is in the process of actively dying at this time.  I gave the grave prognosis to the daughter and spouse and they understand and are firm in their decision to adhere to the DNR/DNI order which I think is appropriate.  The patient is getting duo nebs in line with the BiPAP right now and I have explained that we have provided maximal treatment at this time.  Patient is to be admitted to the hospital.   [CF]  0728 The patient is decompensating.  I have been into the room multiple times and I suspect the family is leaning towards comfort care which I believe would be appropriate.  The patient will be admitted and the hospitalist is working with the patient's family on goals of care.   [CF]    Clinical Course User Index [CF] Loleta RoseForbach, Siddhanth Denk, MD     ____________________________________________  FINAL CLINICAL IMPRESSION(S) / ED DIAGNOSES  Final diagnoses:  Acute respiratory failure with hypoxia and hypercapnia (HCC)  Sepsis, due to unspecified organism, unspecified whether acute organ dysfunction present [facility](HCC)  Urinary tract infection without hematuria, site unspecified  DNR (do not resuscitate)     MEDICATIONS GIVEN DURING THIS VISIT:  Medications  aspirin chewable tablet 81 mg (has no administration in time range)  HYDROmorphone (DILAUDID) tablet 2 mg (has no administration in time range)  furosemide (LASIX) tablet 40  mg (has no administration in time range)  triamterene-hydrochlorothiazide (MAXZIDE-25) 37.5-25 MG per tablet 1 tablet (has no administration in time range)  ALPRAZolam (XANAX) tablet 0.25 mg (has no administration in time range)  sertraline (ZOLOFT) tablet 50 mg (has no administration in time range)  alum & mag hydroxide-simeth (MAALOX/MYLANTA) 200-200-20 MG/5ML suspension 30 mL (has no administration in time range)  docusate sodium (COLACE) capsule 100 mg (has no administration in time range)  polyethylene glycol (MIRALAX / GLYCOLAX) packet 17 g (has no administration in time range)  enoxaparin (LOVENOX) injection 40 mg (has no administration in time range)  ferrous sulfate tablet 325 mg (has no administration in time range)  methocarbamol (ROBAXIN) tablet 750 mg (has no administration in time range)  cholecalciferol (VITAMIN D3) tablet 800 Units (has no administration in time range)  feeding supplement (ENSURE ENLIVE) (ENSURE ENLIVE) liquid 237 mL (has no administration in time range)  multivitamin with minerals tablet 1 tablet (has no administration in time range)  potassium chloride SA (K-DUR) CR tablet 20 mEq (has no administration in time range)  budesonide (PULMICORT) nebulizer solution 0.5 mg (has no administration in time range)  lidocaine (LIDODERM) 5 % 1 patch (has no administration in time range)  polyvinyl alcohol (LIQUIFILM TEARS) 1.4 % ophthalmic solution (has no administration in time range)  0.9 %  sodium chloride infusion (has no administration in time range)  acetaminophen (TYLENOL) tablet 650 mg (has no administration in time range)    Or  acetaminophen (TYLENOL) suppository 650 mg (has no administration in time range)  traZODone (DESYREL) tablet 25 mg (has no administration in time range)  magnesium hydroxide (MILK OF MAGNESIA) suspension 30 mL (has no administration in time range)  ondansetron (ZOFRAN) tablet 4 mg (has no administration in time range)    Or  ondansetron  (ZOFRAN) injection 4 mg (has no administration in time range)  methylPREDNISolone sodium succinate (SOLU-MEDROL) 125 mg/2 mL injection 60 mg (has no administration in time range)    Followed by  predniSONE (DELTASONE) tablet 40 mg (has no administration in time range)  ipratropium-albuterol (DUONEB) 0.5-2.5 (3) MG/3ML nebulizer solution 3 mL (has no administration in time range)  guaiFENesin (MUCINEX) 12 hr tablet 600 mg (has no administration in time range)  vancomycin (VANCOCIN) IVPB 1000 mg/200 mL premix (has no administration in time range)  aztreonam (AZACTAM) 2 g in sodium chloride 0.9 % 100 mL IVPB (has no administration in time range)  aztreonam (AZACTAM) 2 g in sodium chloride 0.9 % 100 mL IVPB (0 g Intravenous Stopped 12/04/2018 0624)  sodium chloride 0.9 % bolus 1,000 mL (1,000 mLs Intravenous New Bag/Given 11/19/2018 0437)  vancomycin (VANCOCIN) 1,500 mg in sodium chloride 0.9 % 500 mL IVPB (0 mg Intravenous Stopped 11/30/2018 0624)  methylPREDNISolone sodium succinate (SOLU-MEDROL) 125 mg/2 mL injection 125 mg (125 mg Intravenous Given 11/29/2018 0623)  ipratropium-albuterol (DUONEB) 0.5-2.5 (3) MG/3ML nebulizer solution 3 mL (3 mLs Nebulization Given 11/27/2018 0558)  ipratropium-albuterol (DUONEB) 0.5-2.5 (3) MG/3ML nebulizer solution 3 mL (3 mLs Nebulization Given 11/22/2018 0558)  ipratropium-albuterol (DUONEB) 0.5-2.5 (3) MG/3ML nebulizer solution 3 mL (3 mLs Nebulization Given 12/01/2018 1610)     ED Discharge Orders    None      *Please note:  Kayler Buckholtz was evaluated in Emergency Department on 11/24/2018 for the symptoms described in the  history of present illness. She was evaluated in the context of the global COVID-19 pandemic, which necessitated consideration that the patient might be at risk for infection with the SARS-CoV-2 virus that causes COVID-19. Institutional protocols and algorithms that pertain to the evaluation of patients at risk for COVID-19 are in a state of rapid change  based on information released by regulatory bodies including the CDC and federal and state organizations. These policies and algorithms were followed during the patient's care in the ED.  Some ED evaluations and interventions may be delayed as a result of limited staffing during the pandemic.*  Note:  This document was prepared using Dragon voice recognition software and may include unintentional dictation errors.   Hinda Kehr, MD 11/17/2018 [phone]

## 2018-12-02 NOTE — Progress Notes (Signed)
   12/04/2018 0600  Clinical Encounter Type  Visited With Patient and family together  Visit Type Initial;Spiritual support  Referral From Nurse  Consult/Referral To Chaplain  Spiritual Encounters  Spiritual Needs Prayer;Emotional;Grief support  Stress Factors  Family Stress Factors Loss  Chaplain was page for support to family. Chaplain arrived and doctor was talking to patient's husband and daughter, who were at bedside. Chaplain introduced herself while the doctor was leaving out. Chaplain gave encouraging words of comfort for the family during this time. Patient husband was concerned about patient suffering, the daughter told husband she was not suffering. The family was waiting for patient grandchildren to arrive from [place]. Daughter ask if Chaplain could pray and husband wanted to know what denomination the Chaplain was, because they wanted prayer prayed in [place] . Chaplain explained to husband that there is no denomination at the care of the patient but whatever he desired we would try to meet their need. (even if we had to called someone else in) Chaplain prayed and family was appreciative, Chaplain allowed family to shared memories of the patient especially her faith in [place].

## 2018-12-02 NOTE — ED Notes (Signed)
ED TO INPATIENT HANDOFF REPORT  ED Nurse Name and Phone #: bill [phone]  S Name/Age/Gender Sarah Mckee 73 y.o. female Room/Bed: ED01A/ED01A  Code Status   Code Status: DNR  Home/SNF/Other none Patient oriented to: self Is this baseline? Yes   Triage Complete: Triage complete  Chief Complaint [facility] -   Triage Note Pt her via EMS from [facility] in extreme resp distress, do response to painful stimuli on 15L NRB. Pt [facility] for [facility]   Allergies Allergies  Allergen Reactions  . Codeine Shortness Of Breath  . Demerol [Meperidine] Shortness Of Breath  . Morphine And Related Shortness Of Breath  . Oxycodone Shortness Of Breath  . Alendronate Other (See Comments)    Other reaction(s): Other (See Comments) esophagitis  . Amoxicillin-Pot Clavulanate Other (See Comments)    Unknown Has patient had a PCN reaction causing immediate rash, facial/tongue/throat swelling, SOB or lightheadedness with hypotension: Unknown Has patient had a PCN reaction causing severe rash involving mucus membranes or skin necrosis: Unknown Has patient had a PCN reaction that required hospitalization: Unknown Has patient had a PCN reaction occurring within the last 10 years: Unknown If all of the above answers are "NO", then may proceed with Cephalosporin use.   Marland Kitchen Risedronate Other (See Comments)    Other reaction(s): Other (See Comments) epigastric pain  . Shellfish Allergy Nausea And Vomiting    Level of Care/Admitting Diagnosis ED Disposition    ED Disposition Condition Comment   Admit  Hospital Area: [facility] [[id number]]  Level of Care: Med-Surg [16]  Covid Evaluation: Confirmed COVID Negative  Diagnosis: Acute respiratory failure (HCC) [518.81.ICD-9-CM]  Admitting Physician: [name] [[id number]]  Attending Physician: [name] [[id number]]  Estimated length of stay: past midnight tomorrow  Certification:: I certify this patient will need inpatient  services for at least 2 midnights  PT Class (Do Not Modify): Inpatient [101]  PT Acc Code (Do Not Modify): Private [1]       B Medical/Surgery History Past Medical History:  Diagnosis Date  . Arthritis   . COPD (chronic obstructive pulmonary disease) (HCC)    2L 02 at baseline  . Hypertension   . Lymphedema of left leg   . Lymphedema of right lower extremity   . Pulmonary HTN (HCC)    Past Surgical History:  Procedure Laterality Date  . CATARACT EXTRACTION W/PHACO Left 04/28/2017   Procedure: CATARACT EXTRACTION PHACO AND INTRAOCULAR LENS PLACEMENT (IOC);  Surgeon: Galen Manila, MD;  Location: [facility] ORS;  Service: Ophthalmology;  Laterality: Left;  [place] 01:00.3 AP% 19.7 CDE 11.88 Fluid Pack lot # [id number]  . INTRAMEDULLARY (IM) NAIL INTERTROCHANTERIC Right 11/18/2018   Procedure: INTRAMEDULLARY (IM) NAIL INTERTROCHANTRIC;  Surgeon: Kennedy Bucker, MD;  Location: [facility] ORS;  Service: Orthopedics;  Laterality: Right;  . KYPHOPLASTY N/A 11/25/2018   Procedure: KYPHOPLASTY L3-L4;  Surgeon: Kennedy Bucker, MD;  Location: [facility] ORS;  Service: Orthopedics;  Laterality: N/A;  . none    . PARS PLANA VITRECTOMY W/ REPAIR OF MACULAR HOLE    . TUBAL LIGATION       A IV Location/Drains/Wounds Patient Lines/Drains/Airways Status   Active Line/Drains/Airways    Name:   Placement date:   Placement time:   Site:   Days:   Peripheral IV 11/21/2018 Right Antecubital   12/01/2018    -    Antecubital   less than 1   Peripheral IV 11/24/2018 Right Wrist   11/20/2018    0431  Wrist   less than 1   Urethral Catheter vAshley Latex 16 Fr.   12/01/2018    0430    Latex   less than 1   Incision (Closed) 04/28/17 Eye Left   04/28/17    0810     583   Incision (Closed) 11/18/18 Hip Right   11/18/18    1524     14   Incision (Closed) 11/25/18 Back Other (Comment)   11/25/18    0943     7          Intake/Output Last 24 hours No intake or output data in the 24 hours ending 11/29/2018 0911  Labs/Imaging Results  for orders placed or performed during the hospital encounter of 11/21/2018 (from the past 48 hour(s))  Blood gas, arterial ([facility], [facility], [facility])     Status: Abnormal   Collection Time: 12/10/2018  3:58 AM  Result Value Ref Range   FIO2 1.00    Delivery systems NON-REBREATHER OXYGEN MASK    pH, Arterial 7.41 7.350 - 7.450   pCO2 arterial 93 (HH) 32.0 - 48.0 mmHg    Comment: CRITICAL RESULT, NOTIFIED PHYSICIAN DR.FORBACH AT 0411 ON 11/17/2018 KSL    pO2, Arterial 99 83.0 - 108.0 mmHg   Bicarbonate 58.9 (H) 20.0 - 28.0 mmol/L   Acid-Base Excess 27.8 (H) 0.0 - 2.0 mmol/L   O2 Saturation 97.7 %   Patient temperature 37.0    Collection site LEFT RADIAL    Sample type ARTERIAL DRAW    Allens test (pass/fail) PASS PASS    Comment: Performed at [facility], [address] [place], [place] [address]  SARS Coronavirus 2 (CEPHEID- Performed in [facility] hospital lab), Hosp Order     Status: None   Collection Time: 11/30/2018  4:02 AM   Specimen: Nasopharyngeal Swab  Result Value Ref Range   SARS Coronavirus 2 NEGATIVE NEGATIVE    Comment: (NOTE) If result is NEGATIVE SARS-CoV-2 target nucleic acids are NOT DETECTED. The SARS-CoV-2 RNA is generally detectable in upper and lower  respiratory specimens during the acute phase of infection. The lowest  concentration of SARS-CoV-2 viral copies this assay can detect is 250  copies / mL. A negative result does not preclude SARS-CoV-2 infection  and should not be used as the sole basis for treatment or other  patient management decisions.  A negative result may occur with  improper specimen collection / handling, submission of specimen other  than nasopharyngeal swab, presence of viral mutation(s) within the  areas targeted by this assay, and inadequate number of viral copies  (<250 copies / mL). A negative result must be combined with clinical  observations, patient history, and epidemiological information. If result is POSITIVE SARS-CoV-2 target  nucleic acids are DETECTED. The SARS-CoV-2 RNA is generally detectable in upper and lower  respiratory specimens dur ing the acute phase of infection.  Positive  results are indicative of active infection with SARS-CoV-2.  Clinical  correlation with patient history and other diagnostic information is  necessary to determine patient infection status.  Positive results do  not rule out bacterial infection or co-infection with other viruses. If result is PRESUMPTIVE POSTIVE SARS-CoV-2 nucleic acids MAY BE PRESENT.   A presumptive positive result was obtained on the submitted specimen  and confirmed on repeat testing.  While 2019 novel coronavirus  (SARS-CoV-2) nucleic acids may be present in the submitted sample  additional confirmatory testing may be necessary for epidemiological  and / or clinical management  purposes  to differentiate between  SARS-CoV-2 and other Sarbecovirus currently known to infect humans.  If clinically indicated additional testing with an alternate test  methodology [phone]) is advised. The SARS-CoV-2 RNA is generally  detectable in upper and lower respiratory sp ecimens during the acute  phase of infection. The expected result is Negative. Fact Sheet for Patients:  BoilerBrush.com.cy Fact Sheet for Healthcare Providers: https://pope.com/ This test is not yet approved or cleared by the [place] FDA and has been authorized for detection and/or diagnosis of SARS-CoV-2 by FDA under an Emergency Use Authorization (EUA).  This EUA will remain in effect (meaning this test can be used) for the duration of the COVID-19 declaration under Section 564(b)(1) of the Act, 21 U.S.C. section 360bbb-3(b)(1), unless the authorization is terminated or revoked sooner. Performed at [facility], [address] [place], [place] [address]   Lactic acid, plasma     Status: None   Collection Time: 12/14/2018  4:02 AM   Result Value Ref Range   Lactic Acid, Venous 1.4 0.5 - 1.9 mmol/L    Comment: Performed at [facility], [address] [place], [place] [address]  Comprehensive metabolic panel     Status: Abnormal   Collection Time: 12/09/2018  4:02 AM  Result Value Ref Range   Sodium 141 135 - 145 mmol/L   Potassium 3.5 3.5 - 5.1 mmol/L   Chloride 84 (L) 98 - 111 mmol/L   CO2 46 (H) 22 - 32 mmol/L   Glucose, Bld 146 (H) 70 - 99 mg/dL   BUN 18 8 - 23 mg/dL   Creatinine, Ser 1.30 0.44 - 1.00 mg/dL   Calcium 8.7 (L) 8.9 - 10.3 mg/dL   Total Protein 6.1 (L) 6.5 - 8.1 g/dL   Albumin 3.2 (L) 3.5 - 5.0 g/dL   AST 23 15 - 41 U/L   ALT 22 0 - 44 U/L   Alkaline Phosphatase 110 38 - 126 U/L   Total Bilirubin 1.8 (H) 0.3 - 1.2 mg/dL   GFR calc non Af Amer >60 >60 mL/min   GFR calc Af Amer >60 >60 mL/min   Anion gap 11 5 - 15    Comment: Performed at [facility], [address] [place], [place] [address]  Lipase, blood     Status: None   Collection Time: 11/18/2018  4:02 AM  Result Value Ref Range   Lipase 21 11 - 51 U/L    Comment: Performed at [facility], [address] [place], [place] [address]  Brain natriuretic peptide - IF patient is dyspneic     Status: Abnormal   Collection Time: 12/12/2018  4:02 AM  Result Value Ref Range   B Natriuretic Peptide 238.0 (H) 0.0 - 100.0 pg/mL    Comment: Performed at [facility], [address] [place], [place] [address]  CBC WITH DIFFERENTIAL     Status: Abnormal   Collection Time: 11/18/2018  4:02 AM  Result Value Ref Range   WBC 9.8 4.0 - 10.5 K/uL   RBC 3.72 (L) 3.87 - 5.11 MIL/uL   Hemoglobin 11.5 (L) 12.0 - 15.0 g/dL   HCT 13.2 44.0 - 10.2 %   MCV 103.0 (H) 80.0 - 100.0 fL   MCH 30.9 26.0 - 34.0 pg   MCHC 30.0 30.0 - 36.0 g/dL   RDW 72.5 36.6 - 44.0 %   Platelets 309 150 - 400 K/uL   nRBC 0.0 0.0 - 0.2 %   Neutrophils  Relative % 81 %   Neutro Abs 7.9 (H) 1.7 - 7.7 K/uL   Lymphocytes Relative 7 %   Lymphs  Abs 0.7 0.7 - 4.0 K/uL   Monocytes Relative 11 %   Monocytes Absolute 1.1 (H) 0.1 - 1.0 K/uL   Eosinophils Relative 0 %   Eosinophils Absolute 0.0 0.0 - 0.5 K/uL   Basophils Relative 0 %   Basophils Absolute 0.0 0.0 - 0.1 K/uL   Immature Granulocytes 1 %   Abs Immature Granulocytes 0.06 0.00 - 0.07 K/uL    Comment: Performed at [facility], [address] [place], [place] [address]  Procalcitonin     Status: None   Collection Time: 12/08/2018  4:02 AM  Result Value Ref Range   Procalcitonin 0.11 ng/mL    Comment:        Interpretation: PCT (Procalcitonin) <= 0.5 ng/mL: Systemic infection (sepsis) is not likely. Local bacterial infection is possible. (NOTE)       Sepsis PCT Algorithm           Lower Respiratory Tract                                      Infection PCT Algorithm    ----------------------------     ----------------------------         PCT < 0.25 ng/mL                PCT < 0.10 ng/mL         Strongly encourage             Strongly discourage   discontinuation of antibiotics    initiation of antibiotics    ----------------------------     -----------------------------       PCT 0.25 - 0.50 ng/mL            PCT 0.10 - 0.25 ng/mL               OR       >80% decrease in PCT            Discourage initiation of                                            antibiotics      Encourage discontinuation           of antibiotics    ----------------------------     -----------------------------         PCT >= 0.50 ng/mL              PCT 0.26 - 0.50 ng/mL               AND        <80% decrease in PCT             Encourage initiation of                                             antibiotics       Encourage continuation           of antibiotics    ----------------------------     -----------------------------        PCT >=  0.50 ng/mL                  PCT > 0.50 ng/mL               AND         increase in PCT                  Strongly encourage                                       initiation of antibiotics    Strongly encourage escalation           of antibiotics                                     -----------------------------                                           PCT <= 0.25 ng/mL                                                 OR                                        > 80% decrease in PCT                                     Discontinue / Do not initiate                                             antibiotics Performed at [facility], [address] [place], [place] [address]   Protime-INR     Status: None   Collection Time: 03-27-19  4:02 AM  Result Value Ref Range   Prothrombin Time 13.2 11.4 - 15.2 seconds   INR 1.0 0.8 - 1.2    Comment: (NOTE) INR goal varies based on device and disease states. Performed at [facility], [address] [place], [place] [address]   Blood Culture (routine x 2)     Status: None (Preliminary result)   Collection Time: 03-27-19  4:02 AM   Specimen: BLOOD  Result Value Ref Range   Specimen Description BLOOD BLOOD LEFT WRIST    Special Requests      BOTTLES DRAWN AEROBIC AND ANAEROBIC Blood Culture results may not be optimal due to an inadequate volume of blood received in culture bottles   Culture      NO GROWTH < 12 HOURS Performed at [facility], [address] [place], [place] [address]    Report Status PENDING   Fibrin derivatives D-Dimer     Status: Abnormal   Collection Time: 03-27-19  4:02 AM  Result Value Ref Range   Fibrin derivatives D-dimer [facility] 2,345.33 (  H) 0.00 - 499.00 ng/mL (FEU)    Comment: (NOTE) <> Exclusion of Venous Thromboembolism (VTE) - OUTPATIENT ONLY   (Emergency Department or [place])   0-499 ng/ml (FEU): With a low to intermediate pretest probability                      for VTE this test result excludes the diagnosis                      of VTE.   >499 ng/ml (FEU) : VTE not excluded; additional work up for VTE is                       required. <> Testing on Inpatients and Evaluation of Disseminated Intravascular   Coagulation (DIC) Reference Range:   0-499 ng/ml (FEU) Performed at [facility], [address] [place], [place] [address]   Lactate dehydrogenase     Status: None   Collection Time: 11/20/2018  4:02 AM  Result Value Ref Range   LDH 173 98 - 192 U/L    Comment: Performed at [facility], [address] [place], [place] [address]  Ferritin     Status: None   Collection Time: 12/01/2018  4:02 AM  Result Value Ref Range   Ferritin 145 11 - 307 ng/mL    Comment: Performed at [facility], [address] [place], [place] [address]  Triglycerides     Status: None   Collection Time: 11/24/2018  4:02 AM  Result Value Ref Range   Triglycerides 121 <150 mg/dL    Comment: Performed at [facility], [address] [place], [place] [address]  Fibrinogen     Status: Abnormal   Collection Time: 11/23/2018  4:02 AM  Result Value Ref Range   Fibrinogen 566 (H) 210 - 475 mg/dL    Comment: Performed at [facility], [address] [place], [place] [address]  Blood Culture (routine x 2)     Status: None (Preliminary result)   Collection Time: 11/27/2018  4:03 AM   Specimen: BLOOD  Result Value Ref Range   Specimen Description BLOOD RIGHT ANTECUBITAL    Special Requests      BOTTLES DRAWN AEROBIC AND ANAEROBIC Blood Culture adequate volume   Culture      NO GROWTH < 12 HOURS Performed at [facility], [address] [place], [place] [address]    Report Status PENDING   Urinalysis, Complete w Microscopic     Status: Abnormal   Collection Time: 12/02/18  4:04 AM  Result Value Ref Range   Color, Urine YELLOW (A) YELLOW   APPearance TURBID (A) CLEAR   Specific Gravity, Urine 1.014 1.005 - 1.030   pH 7.0 5.0 - 8.0   Glucose, UA NEGATIVE NEGATIVE mg/dL   Hgb urine dipstick MODERATE (A) NEGATIVE   Bilirubin Urine NEGATIVE NEGATIVE   Ketones, ur 5 (A) NEGATIVE  mg/dL   Protein, ur 578 (A) NEGATIVE mg/dL   Nitrite NEGATIVE NEGATIVE   Leukocytes,Ua MODERATE (A) NEGATIVE   RBC / HPF 21-50 0 - 5 RBC/hpf   WBC, UA >50 (H) 0 - 5 WBC/hpf   Bacteria, UA MANY (A) NONE SEEN   Squamous Epithelial / LPF 6-10 0 - 5   WBC Clumps PRESENT    Mucus PRESENT     Comment: Performed at [facility], [address] [place], [place] [address]   Dg  Chest Port 1 View  Result Date: December 06, 2018 CLINICAL DATA:  Sepsis EXAM: PORTABLE CHEST 1 VIEW COMPARISON:  11/17/2018 FINDINGS: Streaky density in the lower lungs where airways appear thickened. Normal heart size and mediastinal contours. Emphysema. No effusion or pneumothorax IMPRESSION: 1. Lower lung opacities with streaky appearance favoring atelectasis. There may be underlying bronchitis/bronchopneumonia. 2. Emphysema. Electronically Signed   By: Monte Fantasia M.D.   On: 2018-12-06 04:43    Pending Labs Unresulted Labs (From admission, onward)    Start     Ordered   06-Dec-2018 0900  MRSA PCR Screening  Once,   STAT     December 06, 2018 0746   2018-12-06 0403  C-reactive protein  Add-on,   AD     12/06/2018 0404   06-Dec-2018 0358  Urine culture  Add-on,   AD    Question:  Patient immune status  Answer:  Normal   2018-12-06 0359          Vitals/Pain Today's Vitals   12/06/2018 0800 2018-12-06 0815 12-06-18 0830 12/06/2018 0845  BP:      Pulse: (!) 115 (!) 112 (!) 115 (!) 115  Resp: (!) 31 (!) 27 (!) 29 (!) 33  Temp: 100.3 F (37.9 C) (!) 100.5 F (38.1 C) (!) 100.7 F (38.2 C) (!) 100.9 F (38.3 C)  TempSrc:      SpO2: (!) 70% (!) 76% (!) 70% (!) 78%    Isolation Precautions No active isolations  Medications Medications  acetaminophen (TYLENOL) tablet 650 mg (has no administration in time range)    Or  acetaminophen (TYLENOL) suppository 650 mg (has no administration in time range)  morphine CONCENTRATE 10 MG/0.5ML oral solution 5 mg (has no administration in time range)    Or  morphine CONCENTRATE 10  MG/0.5ML oral solution 5 mg (has no administration in time range)  morphine 4 MG/ML injection 4 mg (has no administration in time range)  LORazepam (ATIVAN) tablet 1 mg (has no administration in time range)    Or  LORazepam (ATIVAN) 2 MG/ML concentrated solution 1 mg (has no administration in time range)    Or  LORazepam (ATIVAN) injection 1 mg (has no administration in time range)  ondansetron (ZOFRAN-ODT) disintegrating tablet 4 mg (has no administration in time range)    Or  ondansetron (ZOFRAN) injection 4 mg (has no administration in time range)  aztreonam (AZACTAM) 2 g in sodium chloride 0.9 % 100 mL IVPB (0 g Intravenous Stopped 06-Dec-2018 0624)  sodium chloride 0.9 % bolus 1,000 mL (1,000 mLs Intravenous New Bag/Given 12-06-18 0437)  vancomycin (VANCOCIN) 1,500 mg in sodium chloride 0.9 % 500 mL IVPB (0 mg Intravenous Stopped 12/06/2018 0624)  methylPREDNISolone sodium succinate (SOLU-MEDROL) 125 mg/2 mL injection 125 mg (125 mg Intravenous Given 2018-12-06 0623)  ipratropium-albuterol (DUONEB) 0.5-2.5 (3) MG/3ML nebulizer solution 3 mL (3 mLs Nebulization Given 12-06-2018 0558)  ipratropium-albuterol (DUONEB) 0.5-2.5 (3) MG/3ML nebulizer solution 3 mL (3 mLs Nebulization Given 12/06/18 0558)  ipratropium-albuterol (DUONEB) 0.5-2.5 (3) MG/3ML nebulizer solution 3 mL (3 mLs Nebulization Given 2018-12-06 0558)    Mobility non-ambulatory High fall risk   Focused Assessments comfort care   R Recommendations: See Admitting Provider Note  Report given to:   Additional Notes:

## 2018-12-02 NOTE — ED Notes (Signed)
To pts room. Pt laying in bed. No distress noted. Pt on NRB @ 15L. Family at bedside.

## 2018-12-02 NOTE — ED Notes (Signed)
Called 1c to give report. RN, Scientist, clinical (histocompatibility and immunogenetics), busy - will call back

## 2018-12-02 NOTE — Progress Notes (Signed)
Advanced care plan.  Purpose of the Encounter: CODE STATUS/end-of-life care  Parties in Attendance: Patient's daughter Ollen Gross  Patient's Decision Capacity: Intact  Subjective/Patient's story: Sarah Mckee  is a 73 y.o. Caucasian female with a known history of multiple medical problems that will be mentioned below including COPD, who presented to the emergency [facility] from [facility] with acute onset of respiratory distress with pulse ox entry of 50% on room air and decreased responsiveness. The patient has been declining over the last couple of weeks.  Patient was admitted with acute respiratory failure initially placed on BiPAP.  I was contacted by the ED nurse that patient's daughter was at the bedside and due to patient declining I went and discussed with her regarding her mom's condition.      Objective/Medical story I discuss with patient's daughter regarding patient's decline.  She states that she does not want her mother to suffer any further.  And would like for her to be comfort care.   Goals of care determination:  Comfort care measures   CODE STATUS:  Comfort care  Time spent discussing advanced care planning: 35 minutes

## 2018-12-02 NOTE — Progress Notes (Signed)
CODE SEPSIS - PHARMACY COMMUNICATION  **Broad Spectrum Antibiotics should be administered within 1 hour of Sepsis diagnosis**  Time Code Sepsis Called/Page Received: n/a  Antibiotics Ordered: vanc/aztreonam  Time of 1st antibiotic administration: [phone]  Additional action taken by pharmacy:   If necessary, Name of Provider/Nurse Contacted:     Tobie Lords ,PharmD Clinical Pharmacist  December 19, 2018  7:18 AM

## 2018-12-02 NOTE — ED Notes (Signed)
Dr Sidney Ace at bedside. Aware of family's decision and will speak with them

## 2018-12-02 NOTE — ED Triage Notes (Signed)
Pt her via EMS from [facility] in extreme resp distress, do response to painful stimuli on 15L NRB. Pt at [facility] for hip rehab

## 2018-12-02 NOTE — Progress Notes (Signed)
Husband, daughter, granddaughter and grandson are the 4 visitors allowed for comfort care.   Names given for entry. Madlyn Frankel, RN

## 2018-12-02 NOTE — ED Notes (Signed)
To room. Asked family if they need anything. Only thing was to roll husband to lobby. Done.

## 2018-12-02 NOTE — H&P (Addendum)
[facility] NAME: Sarah Mckee    MR#:  [id number]  DATE OF BIRTH:  13-Mar-1946  DATE OF ADMISSION:  12/04/2018  PRIMARY CARE PHYSICIAN: Marinda Elk, MD   REQUESTING/REFERRING PHYSICIAN: Hinda Kehr, MD  CHIEF COMPLAINT:   Chief Complaint  Patient presents with  . Respiratory Distress    HISTORY OF PRESENT ILLNESS:  Sarah Mckee  is a 73 y.o. Caucasian female with a known history of multiple medical problems that will be mentioned below including COPD, who presented to the emergency room from [facility] with acute onset of respiratory distress with pulse ox entry of 50% on room air and decreased responsiveness. The patient has been declining over the last couple of weeks.  She had no reported fever or chills.  The patient was nonverbal and therefore no history could be obtained from her.  Upon presentation to the emergency room, blood pressure was 143/63 with a pulse of 104 approximately 99% on her percent nonrebreather.  She was then placed on BiPAP as her ABG showed a pH 7.41, PCO2 of 93, PO2 of 99 bicarbonate of 58.9 and O2 sat of 97.7% 100% FiO2.  Labs otherwise were remarkable for borderline potassium of 3.5 and CO2 of 46.  Her BNP was 238.  Her ferritin was 145 and LDH 173 with procalcitonin 0.11.  CBC showed WBC of 9.8 with hemoglobin of 11.5 and hematocrit 38.3.  Portable chest x-ray showed lower lung opacities with streaky appearance favoring atelectasis but there may be underlying bronchitis/bronchopneumonia.  It showed emphysema.  The patient was given duo nebs x3, 125 mg IV Solu-Medrol, 2 g of IV Azactam and IV vancomycin as well as a liter bolus of IV normal saline.  She will be admitted to the stepdown unit on BiPAP for further evaluation and management. PAST MEDICAL HISTORY:   Past Medical History:  Diagnosis Date  . Arthritis   . COPD (chronic obstructive pulmonary disease) (HCC)    2L 02 at baseline   . Hypertension   . Lymphedema of left leg   . Lymphedema of right lower extremity   . Pulmonary HTN ([place])     PAST SURGICAL HISTORY:   Past Surgical History:  Procedure Laterality Date  . CATARACT EXTRACTION W/PHACO Left 04/28/2017   Procedure: CATARACT EXTRACTION PHACO AND INTRAOCULAR LENS PLACEMENT (IOC);  Surgeon: Birder Robson, MD;  Location: [facility] ORS;  Service: Ophthalmology;  Laterality: Left;  [place] 01:00.3 AP% 19.7 CDE 11.88 Fluid Pack lot # [id number]  . INTRAMEDULLARY (IM) NAIL INTERTROCHANTERIC Right 11/18/2018   Procedure: INTRAMEDULLARY (IM) NAIL INTERTROCHANTRIC;  Surgeon: Hessie Knows, MD;  Location: [facility] ORS;  Service: Orthopedics;  Laterality: Right;  . KYPHOPLASTY N/A 11/25/2018   Procedure: KYPHOPLASTY L3-L4;  Surgeon: Hessie Knows, MD;  Location: [facility] ORS;  Service: Orthopedics;  Laterality: N/A;  . none    . PARS PLANA VITRECTOMY W/ REPAIR OF MACULAR HOLE    . TUBAL LIGATION      SOCIAL HISTORY:   Social History   Tobacco Use  . Smoking status: Former Smoker    Quit date: 05/20/2007    Years since quitting: 11.5  . Smokeless tobacco: Never Used  Substance Use Topics  . Alcohol use: No    FAMILY HISTORY:   Family History  Problem Relation Age of Onset  . Heart attack Mother   . Diabetes Mother   . Cancer Father   . Thyroid disease Father   . Diabetes  Sister   . Diabetes Brother     DRUG ALLERGIES:   Allergies  Allergen Reactions  . Codeine Shortness Of Breath  . Demerol [Meperidine] Shortness Of Breath  . Morphine And Related Shortness Of Breath  . Oxycodone Shortness Of Breath  . Alendronate Other (See Comments)    Other reaction(s): Other (See Comments) esophagitis  . Amoxicillin-Pot Clavulanate Other (See Comments)    Unknown Has patient had a PCN reaction causing immediate rash, facial/tongue/throat swelling, SOB or lightheadedness with hypotension: Unknown Has patient had a PCN reaction causing severe rash involving mucus membranes  or skin necrosis: Unknown Has patient had a PCN reaction that required hospitalization: Unknown Has patient had a PCN reaction occurring within the last 10 years: Unknown If all of the above answers are "NO", then may proceed with Cephalosporin use.   Marland Kitchen. Risedronate Other (See Comments)    Other reaction(s): Other (See Comments) epigastric pain  . Shellfish Allergy Nausea And Vomiting    REVIEW OF SYSTEMS:   ROS As per history of present illness. All pertinent systems were reviewed above. Constitutional,  HEENT, cardiovascular, respiratory, GI, GU, musculoskeletal, neuro, psychiatric, endocrine,  integumentary and hematologic systems were reviewed and are otherwise  negative/unremarkable except for positive findings mentioned above in the HPI.   MEDICATIONS AT HOME:   Prior to Admission medications   Medication Sig Start Date End Date Taking? Authorizing Provider  acetaminophen (TYLENOL) 500 MG tablet Take 500-1,000 mg by mouth See admin instructions. Take 500 mg by mouth in the morning/afternoon and take 1000 mg by mouth in the evening    [provider]  albuterol (PROVENTIL) (2.5 MG/3ML) 0.083% nebulizer solution USE 1 VIAL IN NEBULIZER EVERY 6 HOURS AS NEEDED FOR WHEEZING OR SHORTNESS OF BREATH 03/19/18   Erin FullingKasa, Kurian, MD  ALPRAZolam Prudy Feeler(XANAX) 0.25 MG tablet Take 1 tablet (0.25 mg total) by mouth 2 (two) times daily as needed for anxiety. 11/26/18   Altamese DillingVachhani, Vaibhavkumar, MD  aluminum-magnesium hydroxide-simethicone (MAALOX) 200-200-20 MG/5ML SUSP Take 30 mLs by mouth 4 (four) times daily -  before meals and at bedtime. Patient taking differently: Take 30 mLs by mouth 4 (four) times daily as needed (for indegestion).  04/01/16   Sharman CheekStafford, Phillip, MD  ASPERCREME LIDOCAINE EX Apply 1 application topically as needed (for hand/shoulder pain).    [provider]  aspirin 81 MG tablet Take 81 mg by mouth daily.    [provider]  budesonide (PULMICORT) 0.5 MG/2ML  nebulizer solution Inhale 2 mLs into the lungs 2 (two) times daily. 12/29/17 12/29/18  [provider]  Cholecalciferol (VITAMIN D) 400 UNIT/ML LIQD Take by mouth. Pt taking pill form,   2 tablets daily    [provider]  docusate sodium (COLACE) 100 MG capsule Take 1 capsule (100 mg total) by mouth 2 (two) times daily. 11/26/18   Altamese DillingVachhani, Vaibhavkumar, MD  enoxaparin (LOVENOX) 40 MG/0.4ML injection Inject 0.4 mLs (40 mg total) into the skin daily for 14 days. X 14 days 11/23/18 12/07/18  Evon SlackGaines, Thomas C, PA-C  feeding supplement, ENSURE ENLIVE, (ENSURE ENLIVE) LIQD Take 237 mLs by mouth 2 (two) times daily between meals. 11/26/18   Altamese DillingVachhani, Vaibhavkumar, MD  ferrous sulfate 325 (65 FE) MG tablet Take 1 tablet (325 mg total) by mouth 2 (two) times daily with a meal. 11/26/18   Altamese DillingVachhani, Vaibhavkumar, MD  fluticasone furoate-vilanterol (BREO ELLIPTA) 200-25 MCG/INH AEPB Inhale 1 puff into the lungs daily. 07/26/18   Erin FullingKasa, Kurian, MD  furosemide (LASIX) 40  MG tablet Take 40 mg by mouth daily as needed for fluid.    [provider]  HYDROmorphone (DILAUDID) 2 MG tablet Take 1 tablet (2 mg total) by mouth every 6 (six) hours as needed for moderate pain or severe pain. 11/26/18   Altamese DillingVachhani, Vaibhavkumar, MD  methocarbamol (ROBAXIN) 750 MG tablet Take 1 tablet (750 mg total) by mouth 3 (three) times daily. 11/26/18   Altamese DillingVachhani, Vaibhavkumar, MD  Multiple Vitamin (MULTIVITAMIN WITH MINERALS) TABS tablet Take 1 tablet by mouth daily. 11/27/18   Altamese DillingVachhani, Vaibhavkumar, MD  nystatin cream (MYCOSTATIN) Apply 1 application topically 2 (two) times daily. 01/14/18 01/14/19  [provider]  Polyethyl Glycol-Propyl Glycol (SYSTANE OP) Place 1 drop into both eyes daily.    [provider]  polyethylene glycol (MIRALAX) 17 g packet Take 17 g by mouth daily. 11/26/18   Altamese DillingVachhani, Vaibhavkumar, MD  potassium chloride SA (K-DUR,KLOR-CON) 20 MEQ tablet Take 20 mEq by mouth daily.     [provider]  PROAIR HFA 108 [phone](90 Base) MCG/ACT inhaler Inhale 2 puffs into the lungs every 4 (four) hours as needed for wheezing or shortness of breath.  06/13/15   [provider]  sertraline (ZOLOFT) 25 MG tablet Take 50 mg by mouth daily. 01/14/18 01/14/19  [provider]  Tiotropium Bromide Monohydrate (SPIRIVA RESPIMAT) 2.5 MCG/ACT AERS INHALE 2 PUFFS INTO THE LUNGS BY DAILY 07/07/18   Erin FullingKasa, Kurian, MD  triamterene-hydrochlorothiazide (MAXZIDE-25) 37.5-25 MG tablet Take 1 tablet by mouth daily. 08/06/15   [provider]      VITAL SIGNS:  Blood pressure 109/72, pulse (!) 102, temperature 98.3 F (36.8 C), resp. rate (!) 22, SpO2 99 %.  PHYSICAL EXAMINATION:  Physical Exam  GENERAL:  73 y.o.-year-old patient lying in the bed with no acute distress.  EYES: Pupils equal, round, reactive to light and accommodation. No scleral icterus. Extraocular muscles intact.  HEENT: Head atraumatic, normocephalic. Oropharynx and nasopharynx clear.  NECK:  Supple, no jugular venous distention. No thyroid enlargement, no tenderness.  LUNGS: Normal breath sounds bilaterally, no wheezing, rales,rhonchi or crepitation. No use of accessory muscles of respiration.  CARDIOVASCULAR: Regular rate and rhythm, S1, S2 normal. No murmurs, rubs, or gallops.  ABDOMEN: Soft, nondistended, nontender. Bowel sounds present. No organomegaly or mass.  EXTREMITIES: No pedal edema, cyanosis, or clubbing.  NEUROLOGIC: Cranial nerves II through XII are intact. Muscle strength 5/5 in all extremities. Sensation intact. Gait not checked.  PSYCHIATRIC: The patient is alert and oriented x 3.  Normal affect and good eye contact. SKIN: No obvious rash, lesion, or ulcer.   LABORATORY PANEL:   CBC Recent Labs  Lab 07-26-18 0402  WBC 9.8  HGB 11.5*  HCT 38.3  PLT 309   ------------------------------------------------------------------------------------------------------------------  Chemistries   Recent Labs  Lab 07-26-18 0402  NA 141  K 3.5  CL 84*  CO2 46*  GLUCOSE 146*  BUN 18  CREATININE 0.70  CALCIUM 8.7*  AST 23  ALT 22  ALKPHOS 110  BILITOT 1.8*   ------------------------------------------------------------------------------------------------------------------  Cardiac Enzymes No results for input(s): TROPONINI in the last 168 hours. ------------------------------------------------------------------------------------------------------------------  RADIOLOGY:  Dg Chest Port 1 View  Result Date: 2018-09-29 CLINICAL DATA:  Sepsis EXAM: PORTABLE CHEST 1 VIEW COMPARISON:  11/17/2018 FINDINGS: Streaky density in the lower lungs where airways appear thickened. Normal heart size and mediastinal contours. Emphysema. No effusion or pneumothorax IMPRESSION: 1. Lower lung opacities with streaky appearance favoring atelectasis. There may be underlying bronchitis/bronchopneumonia. 2. Emphysema. Electronically Signed  By: Marnee SpringJonathon  Watts M.D.   On: 2018/11/04 04:43    IMPRESSION AND PLAN:  1.  Acute respiratory failure with hypoxia and hypercarbia likely secondary to COPD exacerbation.  The patient will be continued on BiPAP for now and therefore admitted to a stepdown unit.  Her daughter was with her in the ER and she may resort to discontinuation of BiPAP and making the patient comfort measures at which case the bed can be changed to medical/palliative bed.  For now we will continue management with scheduled and PRN duo nebs, continued IV antibiotic therapy with Azactam and vancomycin for possibly underlying bronchopneumonia contributing to her COPD exacerbation and acute respiratory failure.  The e- link intensivist will be notified should she be admitted to stepdown unit.  The patient's prognosis is fairly poor.  2.  Hypertension.  We will continue her antihypertensives.  3.  DVT prophylaxis.  Subcutaneous Lovenox.   All the records are reviewed and case discussed with ED  provider. The plan of care was discussed in details with the patient (and family). I answered all questions. The patient agreed to proceed with the above mentioned plan. Further management will depend upon hospital course.   CODE STATUS: DNR/DNI  TOTAL CRITICAL CARE TIME TAKING CARE OF THIS PATIENT: 45 minutes.    Hannah BeatJan A Bernise Sylvain M.D on 15-Aug-2018 at 6:21 AM  Pager - [phone]  After 6pm go to [email or web address] - Social research officer, governmentpassword EPAS [facility]  [facility]  Office  [phone]  CC: Primary care physician; Patrice ParadiseMcLaughlin, Miriam K, MD   Note: This dictation was prepared with Dragon dictation along with smaller phrase technology. Any transcriptional errors that result from this process are unintentional.

## 2018-12-02 NOTE — ED Notes (Signed)
Told pt her daughter was coming to see her and she appears at ease at this. Opening and closing her eyes and making some nods

## 2018-12-02 NOTE — Progress Notes (Signed)
Please note, patient is followed by outpatient Palliative at [facility]. CSW [employer]. Flo Shanks BSN, RN, [facility] [employer] [phone]

## 2018-12-02 NOTE — Progress Notes (Signed)
Pharmacy Antibiotic Note  Sarah Mckee is a 73 y.o. female admitted on 2018-12-24 with pneumonia.  Pharmacy has been consulted for vanc/aztreonam dosing. Patient received vanc 1.5g load and aztreonam 2g IV x 1 in ED.  Plan: Could not find evidence of patient receiving a cephalosporin; please f/u on PCN allx, appears as if there isn't a severe one.  Vancomycin 1000 mg IV Q 24 hrs. Goal AUC 400-550. Expected AUC: 498.2 SCr used: 0.8 Cssmin: 11.9  Will continue aztreonam 2g IV q8h per CrCl > 30 ml/min for now. Will continue to monitor and adjust as necessary.     Temp (24hrs), Avg:98.2 F (36.8 C), Min:98.1 F (36.7 C), Max:98.4 F (36.9 C)  Recent Labs  Lab 12/24/2018 0402  WBC 9.8  CREATININE 0.70  LATICACIDVEN 1.4    Estimated Creatinine Clearance: 65.1 mL/min (by C-G formula based on SCr of 0.7 mg/dL).    Allergies  Allergen Reactions  . Codeine Shortness Of Breath  . Demerol [Meperidine] Shortness Of Breath  . Morphine And Related Shortness Of Breath  . Oxycodone Shortness Of Breath  . Alendronate Other (See Comments)    Other reaction(s): Other (See Comments) esophagitis  . Amoxicillin-Pot Clavulanate Other (See Comments)    Unknown Has patient had a PCN reaction causing immediate rash, facial/tongue/throat swelling, SOB or lightheadedness with hypotension: Unknown Has patient had a PCN reaction causing severe rash involving mucus membranes or skin necrosis: Unknown Has patient had a PCN reaction that required hospitalization: Unknown Has patient had a PCN reaction occurring within the last 10 years: Unknown If all of the above answers are "NO", then may proceed with Cephalosporin use.   Marland Kitchen Risedronate Other (See Comments)    Other reaction(s): Other (See Comments) epigastric pain  . Shellfish Allergy Nausea And Vomiting    Thank you for allowing pharmacy to be a part of this patient's care.  Tobie Lords, PharmD, BCPS Clinical Pharmacist 12/24/18 7:19 AM

## 2018-12-02 NOTE — Progress Notes (Signed)
PHARMACY -  BRIEF ANTIBIOTIC NOTE   Pharmacy has received consult(s) for vancomycin from an ED provider.  The patient's profile has been reviewed for ht/wt/allergies/indication/available labs.    One time order(s) placed for vanc 1.5g  Further antibiotics/pharmacy consults should be ordered by admitting physician if indicated.                       Thank you,  Tobie Lords, PharmD, BCPS Clinical Pharmacist Dec 24, 2018  7:21 AM

## 2018-12-02 NOTE — ED Notes (Signed)
Daughter here at bedside, speaking with Dr Karma Greaser.

## 2018-12-02 NOTE — Progress Notes (Signed)
Tylenol suppository given d/t fever. Oral morphine administered per family request d/t shortness of breath and distress. NRB removed, patient placed on 4L-O2 nasal cannula. Mouth care provided. Patient positioned comfortably in bed. Family at bedside. Emotional support provided. Madlyn Frankel, RN

## 2018-12-02 NOTE — Progress Notes (Signed)
[employer] [facility] [place] [facility]                                                                                                                                                                                  Patient Demographics   Nolene Rocks, is a 73 y.o. female, DOB - 1945-12-07, VWU:[id number]  Admit date - 12/28/18   Admitting Physician [name], MD  Outpatient Primary MD for the patient is Patrice Paradise, MD   LOS - 0  Subjective: Called by nurse in the ED patient's respirations getting worse, patient's oxygen still dropping family requested patient be taken off BiPAP and placed on oxygen   Review of Systems:   CONSTITUTIONAL: Unable to provide due to patient Being very drowsy  Vitals:   Vitals:   28-Dec-2018 1045 12/28/18 1100 28-Dec-2018 1115 12-28-18 1130  BP:      Pulse: (!) 115 (!) 116 (!) 116 91  Resp: (!) 29 (!) 32 (!) 32 (!) 31  Temp: (!) 102 F (38.9 C) (!) 102 F (38.9 C) (!) 102.1 F (38.9 C) (!) 102.1 F (38.9 C)  TempSrc:      SpO2: 91% (!) 88% (!) 87% 91%    Wt Readings from Last 3 Encounters:  11/25/18 87 kg  06/30/18 82.3 kg  12/31/17 83.5 kg    No intake or output data in the 24 hours ending 28-Dec-2018 1322  Physical Exam:   GENERAL: Patient critically ill HEAD, EYES, EARS, NOSE AND THROAT: Atraumatic, normocephalic. Extraocular muscles are intact. Pupils equal and reactive to light. Sclerae anicteric. No conjunctival injection. No oro-pharyngeal erythema.  NECK: Supple. There is no jugular venous distention. No bruits, no lymphadenopathy, no thyromegaly.  HEART: Regular rate and rhythm,. No murmurs, no rubs, no clicks.  LUNGS: Decreased breath sounds ABDOMEN: Soft, flat, nontender, nondistended. Has good bowel sounds. No hepatosplenomegaly appreciated.  EXTREMITIES: No evidence of any cyanosis, clubbing, or peripheral edema.  +2 pedal and radial pulses bilaterally.  NEUROLOGIC: Patient unresponsive SKIN: Moist and  warm with no rashes appreciated.  Psych: Not anxious, depressed LN: No inguinal LN enlargement    Antibiotics   Anti-infectives (From admission, onward)   Start     Dose/Rate Route Frequency Ordered Stop   12/16/2018 0500  vancomycin (VANCOCIN) IVPB 1000 mg/200 mL premix  Status:  Discontinued     1,000 mg 200 mL/hr over 60 Minutes Intravenous Every 24 hours 2018-12-28 0716 28-Dec-2018 0822   12/28/2018 1400  aztreonam (AZACTAM) 2 g in sodium chloride 0.9 % 100 mL IVPB  Status:  Discontinued     2 g 200 mL/hr over 30 Minutes Intravenous Every 8 hours 12-28-18 0716  12/08/2018 0822   12/11/2018 1230  aztreonam (AZACTAM) injection 1 g  Status:  Discontinued     1 g Intramuscular Every 8 hours 11/30/2018 0619 11/24/2018 0716   12/17/2018 0415  vancomycin (VANCOCIN) 1,500 mg in sodium chloride 0.9 % 500 mL IVPB     1,500 mg 250 mL/hr over 120 Minutes Intravenous  Once 11/25/2018 0412 12/01/2018 0624   12/01/2018 0400  vancomycin (VANCOCIN) IVPB 1000 mg/200 mL premix  Status:  Discontinued     1,000 mg 200 mL/hr over 60 Minutes Intravenous  Once 12/12/2018 0359 12/10/2018 0412   11/24/2018 0400  aztreonam (AZACTAM) 2 g in sodium chloride 0.9 % 100 mL IVPB     2 g 200 mL/hr over 30 Minutes Intravenous  Once 11/28/2018 0359 12/15/2018 6578      Medications   Scheduled Meds: Continuous Infusions: PRN Meds:.acetaminophen **OR** acetaminophen, LORazepam **OR** LORazepam **OR** LORazepam, morphine injection, morphine CONCENTRATE **OR** morphine CONCENTRATE, ondansetron **OR** ondansetron (ZOFRAN) IV   Data Review:   Micro Results Recent Results (from the past 240 hour(s))  SARS Coronavirus 2 (CEPHEID - Performed in [facility] hospital lab), Hosp Order     Status: None   Collection Time: 11/23/18  1:09 PM   Specimen: Nasopharyngeal Swab  Result Value Ref Range Status   SARS Coronavirus 2 NEGATIVE NEGATIVE Final    Comment: (NOTE) If result is NEGATIVE SARS-CoV-2 target nucleic acids are NOT DETECTED. The SARS-CoV-2  RNA is generally detectable in upper and lower  respiratory specimens during the acute phase of infection. The lowest  concentration of SARS-CoV-2 viral copies this assay can detect is 250  copies / mL. A negative result does not preclude SARS-CoV-2 infection  and should not be used as the sole basis for treatment or other  patient management decisions.  A negative result may occur with  improper specimen collection / handling, submission of specimen other  than nasopharyngeal swab, presence of viral mutation(s) within the  areas targeted by this assay, and inadequate number of viral copies  (<250 copies / mL). A negative result must be combined with clinical  observations, patient history, and epidemiological information. If result is POSITIVE SARS-CoV-2 target nucleic acids are DETECTED. The SARS-CoV-2 RNA is generally detectable in upper and lower  respiratory specimens dur ing the acute phase of infection.  Positive  results are indicative of active infection with SARS-CoV-2.  Clinical  correlation with patient history and other diagnostic information is  necessary to determine patient infection status.  Positive results do  not rule out bacterial infection or co-infection with other viruses. If result is PRESUMPTIVE POSTIVE SARS-CoV-2 nucleic acids MAY BE PRESENT.   A presumptive positive result was obtained on the submitted specimen  and confirmed on repeat testing.  While 2019 novel coronavirus  (SARS-CoV-2) nucleic acids may be present in the submitted sample  additional confirmatory testing may be necessary for epidemiological  and / or clinical management purposes  to differentiate between  SARS-CoV-2 and other Sarbecovirus currently known to infect humans.  If clinically indicated additional testing with an alternate test  methodology [phone]) is advised. The SARS-CoV-2 RNA is generally  detectable in upper and lower respiratory sp ecimens during the acute  phase of  infection. The expected result is Negative. Fact Sheet for Patients:  BoilerBrush.com.cy Fact Sheet for Healthcare Providers: https://pope.com/ This test is not yet approved or cleared by the [place] FDA and has been authorized for detection and/or diagnosis of SARS-CoV-2 by FDA under an Emergency  Use Authorization (EUA).  This EUA will remain in effect (meaning this test can be used) for the duration of the COVID-19 declaration under Section 564(b)(1) of the Act, 21 U.S.C. section 360bbb-3(b)(1), unless the authorization is terminated or revoked sooner. Performed at [facility], [address] [place], [place] [address]   SARS Coronavirus 2 [facility](Hospital order, Performed in [facility] hospital lab)     Status: None   Collection Time: 11/26/18  1:49 PM   Specimen: Nasopharyngeal Swab  Result Value Ref Range Status   SARS Coronavirus 2 NEGATIVE NEGATIVE Final    Comment: (NOTE) If result is NEGATIVE SARS-CoV-2 target nucleic acids are NOT DETECTED. The SARS-CoV-2 RNA is generally detectable in upper and lower  respiratory specimens during the acute phase of infection. The lowest  concentration of SARS-CoV-2 viral copies this assay can detect is 250  copies / mL. A negative result does not preclude SARS-CoV-2 infection  and should not be used as the sole basis for treatment or other  patient management decisions.  A negative result may occur with  improper specimen collection / handling, submission of specimen other  than nasopharyngeal swab, presence of viral mutation(s) within the  areas targeted by this assay, and inadequate number of viral copies  (<250 copies / mL). A negative result must be combined with clinical  observations, patient history, and epidemiological information. If result is POSITIVE SARS-CoV-2 target nucleic acids are DETECTED. The SARS-CoV-2 RNA is generally detectable in upper and lower   respiratory specimens dur ing the acute phase of infection.  Positive  results are indicative of active infection with SARS-CoV-2.  Clinical  correlation with patient history and other diagnostic information is  necessary to determine patient infection status.  Positive results do  not rule out bacterial infection or co-infection with other viruses. If result is PRESUMPTIVE POSTIVE SARS-CoV-2 nucleic acids MAY BE PRESENT.   A presumptive positive result was obtained on the submitted specimen  and confirmed on repeat testing.  While 2019 novel coronavirus  (SARS-CoV-2) nucleic acids may be present in the submitted sample  additional confirmatory testing may be necessary for epidemiological  and / or clinical management purposes  to differentiate between  SARS-CoV-2 and other Sarbecovirus currently known to infect humans.  If clinically indicated additional testing with an alternate test  methodology [phone]([id number]) is advised. The SARS-CoV-2 RNA is generally  detectable in upper and lower respiratory sp ecimens during the acute  phase of infection. The expected result is Negative. Fact Sheet for Patients:  BoilerBrush.com.cyhttps://www.fda.gov/media/136312/download Fact Sheet for Healthcare Providers: https://pope.com/https://www.fda.gov/media/136313/download This test is not yet approved or cleared by the [place] FDA and has been authorized for detection and/or diagnosis of SARS-CoV-2 by FDA under an Emergency Use Authorization (EUA).  This EUA will remain in effect (meaning this test can be used) for the duration of the COVID-19 declaration under Section 564(b)(1) of the Act, 21 U.S.C. section 360bbb-3(b)(1), unless the authorization is terminated or revoked sooner. Performed at [facility], [address] [place], [place] [address]   SARS Coronavirus 2 (CEPHEID- Performed in [facility] hospital lab), Hosp Order     Status: None   Collection Time: 12/05/2018  4:02 AM   Specimen: Nasopharyngeal Swab   Result Value Ref Range Status   SARS Coronavirus 2 NEGATIVE NEGATIVE Final    Comment: (NOTE) If result is NEGATIVE SARS-CoV-2 target nucleic acids are NOT DETECTED. The SARS-CoV-2 RNA is generally detectable in upper and lower  respiratory specimens during  the acute phase of infection. The lowest  concentration of SARS-CoV-2 viral copies this assay can detect is 250  copies / mL. A negative result does not preclude SARS-CoV-2 infection  and should not be used as the sole basis for treatment or other  patient management decisions.  A negative result may occur with  improper specimen collection / handling, submission of specimen other  than nasopharyngeal swab, presence of viral mutation(s) within the  areas targeted by this assay, and inadequate number of viral copies  (<250 copies / mL). A negative result must be combined with clinical  observations, patient history, and epidemiological information. If result is POSITIVE SARS-CoV-2 target nucleic acids are DETECTED. The SARS-CoV-2 RNA is generally detectable in upper and lower  respiratory specimens dur ing the acute phase of infection.  Positive  results are indicative of active infection with SARS-CoV-2.  Clinical  correlation with patient history and other diagnostic information is  necessary to determine patient infection status.  Positive results do  not rule out bacterial infection or co-infection with other viruses. If result is PRESUMPTIVE POSTIVE SARS-CoV-2 nucleic acids MAY BE PRESENT.   A presumptive positive result was obtained on the submitted specimen  and confirmed on repeat testing.  While 2019 novel coronavirus  (SARS-CoV-2) nucleic acids may be present in the submitted sample  additional confirmatory testing may be necessary for epidemiological  and / or clinical management purposes  to differentiate between  SARS-CoV-2 and other Sarbecovirus currently known to infect humans.  If clinically indicated additional  testing with an alternate test  methodology [phone]([id number]) is advised. The SARS-CoV-2 RNA is generally  detectable in upper and lower respiratory sp ecimens during the acute  phase of infection. The expected result is Negative. Fact Sheet for Patients:  BoilerBrush.com.cyhttps://www.fda.gov/media/136312/download Fact Sheet for Healthcare Providers: https://pope.com/https://www.fda.gov/media/136313/download This test is not yet approved or cleared by the [place] FDA and has been authorized for detection and/or diagnosis of SARS-CoV-2 by FDA under an Emergency Use Authorization (EUA).  This EUA will remain in effect (meaning this test can be used) for the duration of the COVID-19 declaration under Section 564(b)(1) of the Act, 21 U.S.C. section 360bbb-3(b)(1), unless the authorization is terminated or revoked sooner. Performed at [facility], [address] [place], [place] [address]   Blood Culture (routine x 2)     Status: None (Preliminary result)   Collection Time: 2018/08/06  4:02 AM   Specimen: BLOOD  Result Value Ref Range Status   Specimen Description BLOOD BLOOD LEFT WRIST  Final   Special Requests   Final    BOTTLES DRAWN AEROBIC AND ANAEROBIC Blood Culture results may not be optimal due to an inadequate volume of blood received in culture bottles   Culture   Final    NO GROWTH < 12 HOURS Performed at [facility], [address] [place], [place] [address]    Report Status PENDING  Incomplete  Blood Culture (routine x 2)     Status: None (Preliminary result)   Collection Time: 2018/08/06  4:03 AM   Specimen: BLOOD  Result Value Ref Range Status   Specimen Description BLOOD RIGHT ANTECUBITAL  Final   Special Requests   Final    BOTTLES DRAWN AEROBIC AND ANAEROBIC Blood Culture adequate volume   Culture   Final    NO GROWTH < 12 HOURS Performed at [facility], [address] [place], [place] [address]    Report Status PENDING  Incomplete  Radiology Reports Dg Lumbar  Spine 2-3 Views  Result Date: 11/25/2018 CLINICAL DATA:  Benign compression fractures of L3 and L4. Kyphoplasties. EXAM: LUMBAR SPINE - 2-3 VIEW; DG C-ARM 61-120 MIN COMPARISON:  Lumbar MRI dated 11/23/2018 FINDINGS: AP and lateral C-arm images demonstrate and at kyphoplasty has been performed at L3 and L4. IMPRESSION: Kyphoplasty at L3 and L4. FLUOROSCOPY TIME:  2 minutes 3 seconds C-arm fluoroscopic images were obtained intraoperatively and submitted for post operative interpretation. Electronically Signed   By: Francene Boyers M.D.   On: 11/25/2018 10:14   Dg Lumbar Spine Complete  Result Date: 11/20/2018 CLINICAL DATA:  Low back pain. EXAM: LUMBAR SPINE - COMPLETE 4+ VIEW COMPARISON:  CT scan of April 01, 2016. FINDINGS: Diffuse osteopenia is noted. No spondylolisthesis is noted. Atherosclerosis of abdominal aorta is noted. Stable old L1 and L2 compression fractures are noted. Mild compression deformity of L3 vertebral body is noted consistent with fracture of indeterminate age. No spondylolisthesis is noted. IMPRESSION: Diffuse osteopenia. Old L1 and L2 compression fractures are noted. Interval mild compression deformity of L3 vertebral body consistent with fracture of indeterminate age; MRI may be performed further evaluation. Aortic Atherosclerosis (ICD10-I70.0). Electronically Signed   By: Lupita Raider M.D.   On: 11/20/2018 10:58   Mr Lumbar Spine Wo Contrast  Result Date: 11/23/2018 CLINICAL DATA:  Larey Seat.  Back pain and hip pain. EXAM: MRI LUMBAR SPINE WITHOUT CONTRAST TECHNIQUE: Multiplanar, multisequence MR imaging of the lumbar spine was performed. No intravenous contrast was administered. COMPARISON:  CT scan 04/01/2016 FINDINGS: Segmentation: There are five lumbar type vertebral bodies. The last full intervertebral disc space is labeled L5-S1. This correlates with the prior CT scan. Alignment:  Normal Vertebrae: There are acute or subacute fractures of L3 and L4. Minimal posterior  compression deformity of L3. No significant compression deformity of L4. The L3 fracture extends through the midportion of the vertebrae and the L4 fracture is more superior. There is very slight buckling of the posterior cortex of L3 with minimal retropulsion. No canal compromise. Remote compression fracture of L1 and L2. Conus medullaris and cauda equina: Conus extends to the bottom of L1 level. Conus and cauda equina appear normal. Paraspinal and other soft tissues: No significant paraspinal or retroperitoneal findings. There is a moderate-sized left renal cyst noted. Disc levels: T12-L1: Remote compression fracture of L1 with minimal retropulsion but no canal compromise. No disc protrusions or foraminal stenosis. L1-2: Mild bulging annulus and slight flattening of the ventral thecal sac but no significant spinal or foraminal stenosis. L2-3: No significant findings.  Mild facet disease. L3-4: Mild annular bulge with slight flattening of the ventral thecal sac but no significant spinal or foraminal stenosis. L4-5: Bulging annulus and moderate facet disease with mild bilateral lateral recess encroachment. No spinal or foraminal stenosis. L5-S1: Moderate facet disease but no disc protrusions, spinal or foraminal stenosis. IMPRESSION: 1. Mild acute or subacute compression fractures of L3 and L4 as detailed above. No significant retropulsion or canal compromise. 2. Remote L1 and L2 compression fractures. 3. No significant disc protrusions, spinal or foraminal stenosis in the lumbar spine. Electronically Signed   By: Rudie Meyer M.D.   On: 11/23/2018 16:53   Dg Chest Port 1 View  Result Date: 12/14/2018 CLINICAL DATA:  Sepsis EXAM: PORTABLE CHEST 1 VIEW COMPARISON:  11/17/2018 FINDINGS: Streaky density in the lower lungs where airways appear thickened. Normal heart size and mediastinal contours. Emphysema. No effusion or pneumothorax IMPRESSION: 1. Lower lung opacities with  streaky appearance favoring  atelectasis. There may be underlying bronchitis/bronchopneumonia. 2. Emphysema. Electronically Signed   By: Monte Fantasia M.D.   On: Dec 15, 2018 04:43   Dg Chest Port 1 View  Result Date: 11/17/2018 CLINICAL DATA:  73 year old female status post fall.  Hip fracture. EXAM: PORTABLE CHEST 1 VIEW COMPARISON:  Chest radiographs 06/27/2017 and earlier. FINDINGS: AP supine view at 2322 hours. Chronic large lung volumes with emphysema demonstrated on 2018 chest CT. No pneumothorax, pulmonary edema, pleural effusion or acute pulmonary opacity. Normal cardiac size and mediastinal contours. Visualized tracheal air column is within normal limits. Negative visible bowel gas pattern. IMPRESSION: Emphysema (ICD10-J43.9). No acute cardiopulmonary abnormality. Electronically Signed   By: Genevie Ann M.D.   On: 11/17/2018 23:40   Dg C-arm 1-60 Min  Result Date: 11/25/2018 CLINICAL DATA:  Benign compression fractures of L3 and L4. Kyphoplasties. EXAM: LUMBAR SPINE - 2-3 VIEW; DG C-ARM 61-120 MIN COMPARISON:  Lumbar MRI dated 11/23/2018 FINDINGS: AP and lateral C-arm images demonstrate and at kyphoplasty has been performed at L3 and L4. IMPRESSION: Kyphoplasty at L3 and L4. FLUOROSCOPY TIME:  2 minutes 3 seconds C-arm fluoroscopic images were obtained intraoperatively and submitted for post operative interpretation. Electronically Signed   By: Lorriane Shire M.D.   On: 11/25/2018 10:14   Dg Hip Operative Unilat W Or W/o Pelvis Right  Result Date: 11/18/2018 CLINICAL DATA:  ORIF right hip. EXAM: OPERATIVE right HIP (WITH PELVIS IF PERFORMED) 4 VIEWS TECHNIQUE: Fluoroscopic spot image(s) were submitted for interpretation post-operatively. COMPARISON:  11/17/2018 FINDINGS: 4 images show gamma nail fixation of an intertrochanteric fracture of the right femur. Good position and alignment. Distal locking screw in place. IMPRESSION: Gamma nail fixation of intertrochanteric fracture. Electronically Signed   By: Nelson Chimes M.D.   On:  11/18/2018 16:01   Dg Hip Unilat W Or Wo Pelvis 2-3 Views Right  Result Date: 11/17/2018 CLINICAL DATA:  73 year old female status post fall. Hip pain. EXAM: DG HIP (WITH OR WITHOUT PELVIS) 2-3V RIGHT COMPARISON:  CT Abdomen and Pelvis 04/01/2016. FINDINGS: Chronic bilateral femoral head AVN as seen on the comparison CT. There is a comminuted acute intertrochanteric fracture of the right femur with varus impaction. The right femoral head remains normally located. The proximal left femur appears grossly intact. Chronic right inferior pubic ramus fracture. No acute pelvis fracture identified. Negative visible bowel gas pattern. IMPRESSION: 1. Comminuted acute intertrochanteric fracture of the right femur with varus impaction. 2. Chronic bilateral femoral head AVN. Chronic right inferior pubic ramus fracture. Electronically Signed   By: Genevie Ann M.D.   On: 11/17/2018 23:42     CBC Recent Labs  Lab December 15, 2018 0402  WBC 9.8  HGB 11.5*  HCT 38.3  PLT 309  MCV 103.0*  MCH 30.9  MCHC 30.0  RDW 14.7  LYMPHSABS 0.7  MONOABS 1.1*  EOSABS 0.0  BASOSABS 0.0    Chemistries  Recent Labs  Lab 2018/12/15 0402  NA 141  K 3.5  CL 84*  CO2 46*  GLUCOSE 146*  BUN 18  CREATININE 0.70  CALCIUM 8.7*  AST 23  ALT 22  ALKPHOS 110  BILITOT 1.8*   ------------------------------------------------------------------------------------------------------------------ estimated creatinine clearance is 65.1 mL/min (by C-G formula based on SCr of 0.7 mg/dL). ------------------------------------------------------------------------------------------------------------------ No results for input(s): HGBA1C in the last 72 hours. ------------------------------------------------------------------------------------------------------------------ Recent Labs    December 15, 2018 0402  TRIG 121   ------------------------------------------------------------------------------------------------------------------ No results for  input(s): TSH, T4TOTAL, T3FREE, THYROIDAB in the last 72 hours.  Invalid  input(s): FREET3 ------------------------------------------------------------------------------------------------------------------ Recent Labs    12/17/2018 0402  FERRITIN 145    Coagulation profile Recent Labs  Lab 12/09/2018 0402  INR 1.0    No results for input(s): DDIMER in the last 72 hours.  Cardiac Enzymes No results for input(s): CKMB, TROPONINI, MYOGLOBIN in the last 168 hours.  Invalid input(s): CK ------------------------------------------------------------------------------------------------------------------ Invalid input(s): POCBNP    Assessment & Plan      1.  Acute respiratory failure with hypoxia and hypercarbia likely secondary to COPD exacerbation.    Patient's condition has progressively gotten worse At this point have increased her oxygen to high flow Continue nebs and other treatment I have discussed with the daughter regarding overall poor poor prognosis we are discussing possible comfort care if patient continues to deteriorate  2.  Hypertension.  We will continue her antihypertensives.  3.  DVT prophylaxis.  Subcutaneous Lovenox.      Code Status Orders  (From admission, onward)         Start     Ordered   11/26/2018 0824  Do not attempt resuscitation (DNR)  Continuous    Question Answer Comment  In the event of cardiac or respiratory ARREST Do not call a "code blue"   In the event of cardiac or respiratory ARREST Do not perform Intubation, CPR, defibrillation or ACLS   In the event of cardiac or respiratory ARREST Use medication by any route, position, wound care, and other measures to relive pain and suffering. May use oxygen, suction and manual treatment of airway obstruction as needed for comfort.      12/09/2018 0824        Code Status History    Date Active Date Inactive Code Status Order ID Comments User Context   12/09/2018 0648 11/28/2018 0824 DNR  [id number]  Hannah BeatMansy, Jan A, MD ED   11/20/2018 0619 12/10/2018 0648 Full Code [id number]  Mansy, Vernetta HoneyJan A, MD ED   12/09/2018 0425 12/08/2018 0619 DNR [id number]  Loleta RoseForbach, Cory, MD ED   11/18/2018 0204 11/26/2018 2037 Full Code [id number]  Mansy, Vernetta HoneyJan A, MD ED   06/27/2017 1303 06/30/2017 2031 Full Code [id number]  [place] BilberryPatel, Malashia Kamaka, MD Inpatient   08/11/2015 1130 08/16/2015 1712 Full Code [id number]  [place] BilberryPatel, Patra Gherardi, MD ED   Advance Care Planning Activity    Advance Directive Documentation     Most Recent Value  Type of Advance Directive  Healthcare Power of Attorney, Out of facility DNR (pink MOST or yellow form)  Pre-existing out of facility DNR order (yellow form or pink MOST form)  Pink MOST form placed in chart (order not valid for inpatient use) [EDP spoke with daughter, most form incomplete]  "MOST" Form in Place?  -           Consultspulm critical care  DVT Prophylaxis  Lovenox   Lab Results  Component Value Date   PLT 309 11/28/2018     Time Spent in minutes 45 minutes of critical care time spent greater than 50% of time spent in care coordination and counseling patient regarding the condition and plan of care.   [place] BilberryShreyang Xianna Siverling M.D on 12/13/2018 at 1:22 PM  Between 7am to 6pm - Pager - [phone]  After 6pm go to [email or web address] - Social research officer, governmentpassword [email or web address]  [employer]   Office  [phone]

## 2018-12-02 NOTE — ED Notes (Signed)
Family requests that pt be removed from bi-pap and placed on [place]. When asked if that is what she wants pt nodded yes. edp notified and pt placed on 4 LNC

## 2018-12-02 NOTE — ED Notes (Signed)
In pts room due to pts increasing temp. Pt unable to swallow pills. Family did not want PR tylenol at this time.

## 2018-12-06 LAB — URINE CULTURE
Culture: >=100000 — AB
Special Requests: NORMAL

## 2018-12-07 LAB — CULTURE, BLOOD (ROUTINE X 2)
Culture: NO GROWTH
Culture: NO GROWTH
Special Requests: ADEQUATE

## 2018-12-18 NOTE — Discharge Summary (Signed)
[place] at [facility] Date of Admission: 12/28/2018  3:51 AM  Date of death:   Admitting diagnosis: Acute respiratory failure     Diagnosis at time of death 1.  Acute respiratory failure with hypoxia and hypercarbia 2.  Acute COPD exasperation 3.  Pulmonary hypertension 4.  Essential hypertension     Hospital course Sarah Mckee a73 y.o.Caucasian femalewith a known history of multiple medical problems that will be mentioned below including COPD, who presented [facility] with acute onset of respiratory distress with pulse ox entry of 50% on room air and decreased responsiveness. The patient has been declining over the last couple of weeks.  Patient was admitted with acute respiratory failure initially placed on BiPAP.  Patient had already been seen by palliative care team at the facility where she stays.  When I evaluated the patient she was getting worse.  Subsequently daughter wanted her to be comfort measures.  Patient was made comfort care and she passed away early this morning.          TOTAL TIME TAKING CARE OF THIS PATIENT:55 minutes.    Dustin Flock M.D on 29-Dec-2018 at 11:02 AM  Between 7am to 6pm - Pager - [phone]  After 6pm go to [email or web address] - password [email or web address]  [employer] Office  [phone]  CC: Primary care physician; Marinda Elk, MD

## 2018-12-18 NOTE — Plan of Care (Signed)
Last family came up to say goodbye.  Family left.  Foley removed.  Body prepared and funeral home took body.

## 2018-12-18 DEATH — deceased

## 2020-02-05 IMAGING — CR PORTABLE CHEST - 1 VIEW
1 series · 1 of 1 positions shown · non-contrast
Comparison: Chest radiographs 06/27/2017 and earlier.

CLINICAL DATA: 73-year-old female status post fall.  Hip fracture.

EXAM:
PORTABLE CHEST 1 VIEW

[chest ap]
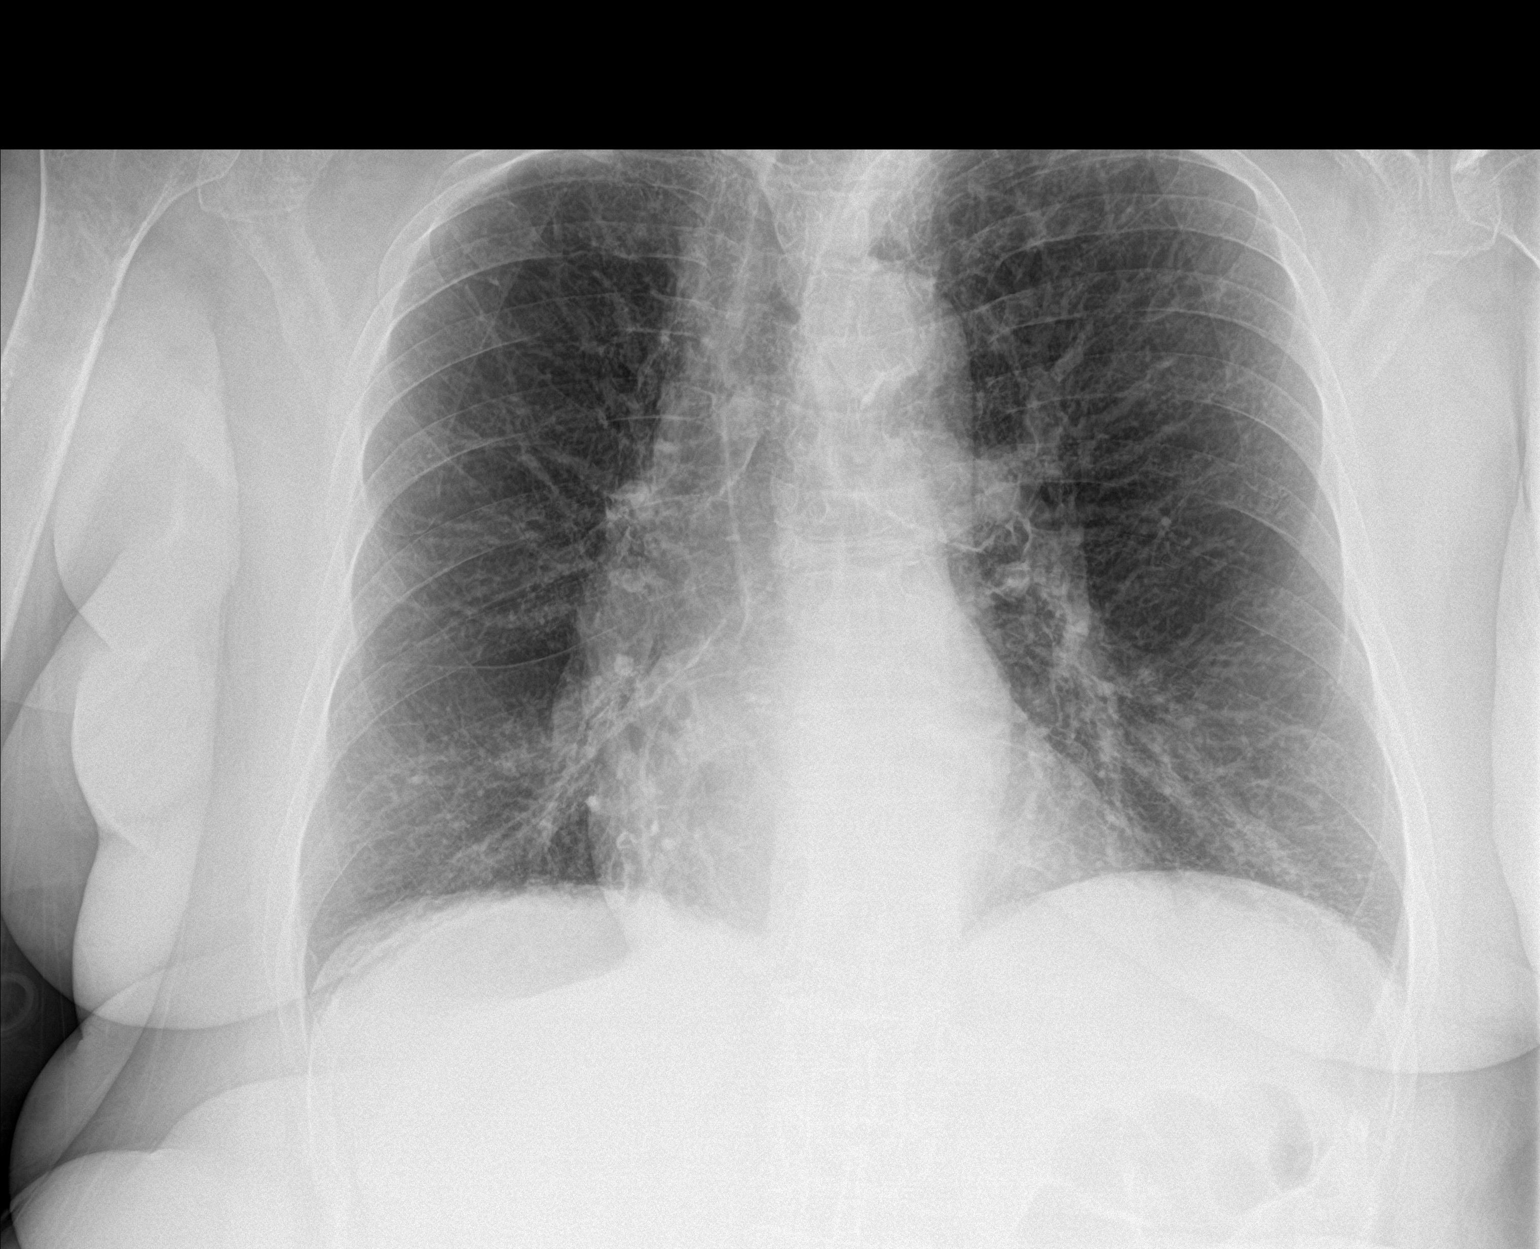

[1 of 1 positions shown; findings below may reference images not displayed]

FINDINGS: AP supine view at 3333 hours. Chronic large lung volumes with
emphysema demonstrated on 3206 chest CT.

No pneumothorax, pulmonary edema, pleural effusion or acute
pulmonary opacity. Normal cardiac size and mediastinal contours.
Visualized tracheal air column is within normal limits. Negative
visible bowel gas pattern.
IMPRESSION: Emphysema ([id number]). No acute cardiopulmonary abnormality.

## 2020-02-08 IMAGING — CR LUMBAR SPINE - COMPLETE 4+ VIEW
4 series · 4 of 4 positions shown · non-contrast
Comparison: CT scan of April 01, 2016.

CLINICAL DATA: Low back pain.

EXAM:
LUMBAR SPINE - COMPLETE 4+ VIEW

[l-spine ap]
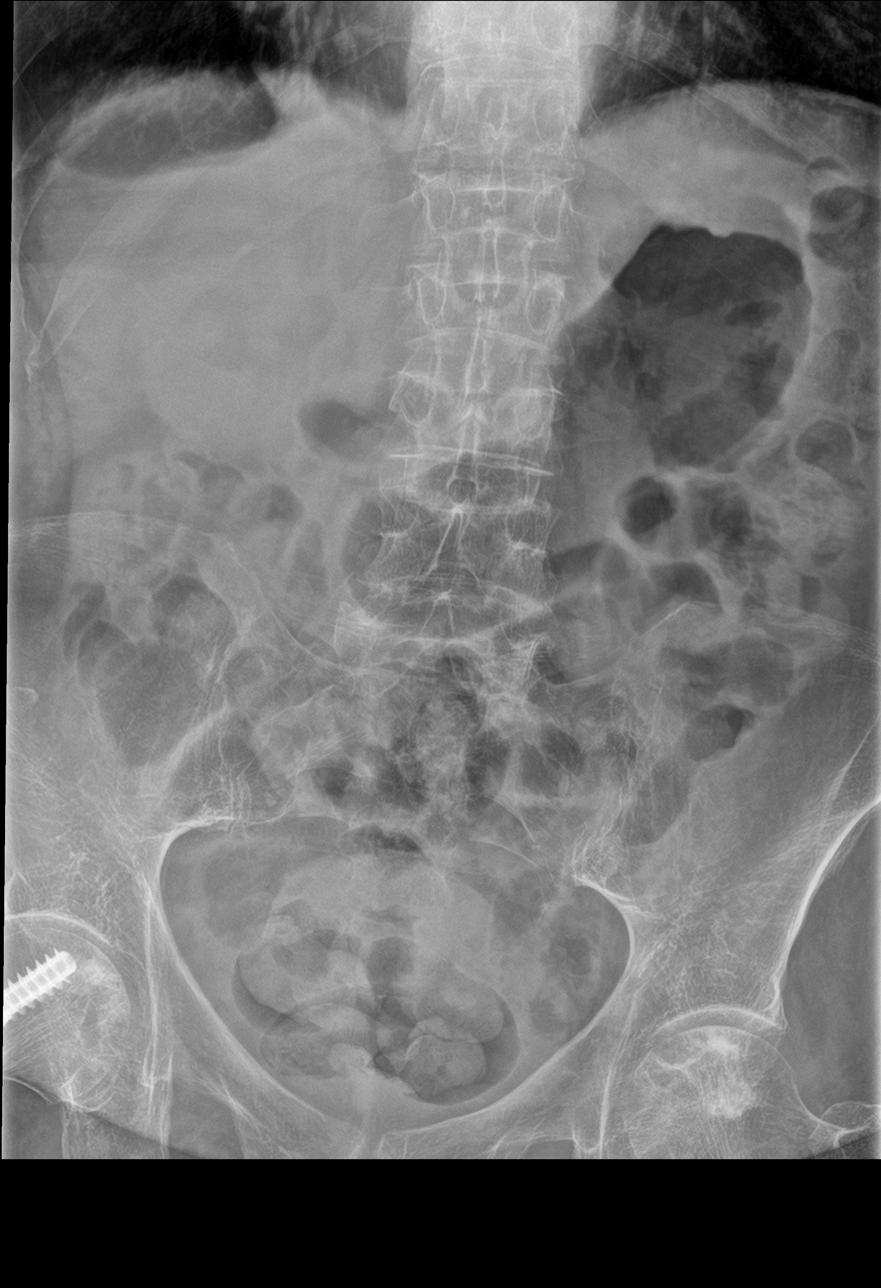

[l-spine obl (1 of 2)]
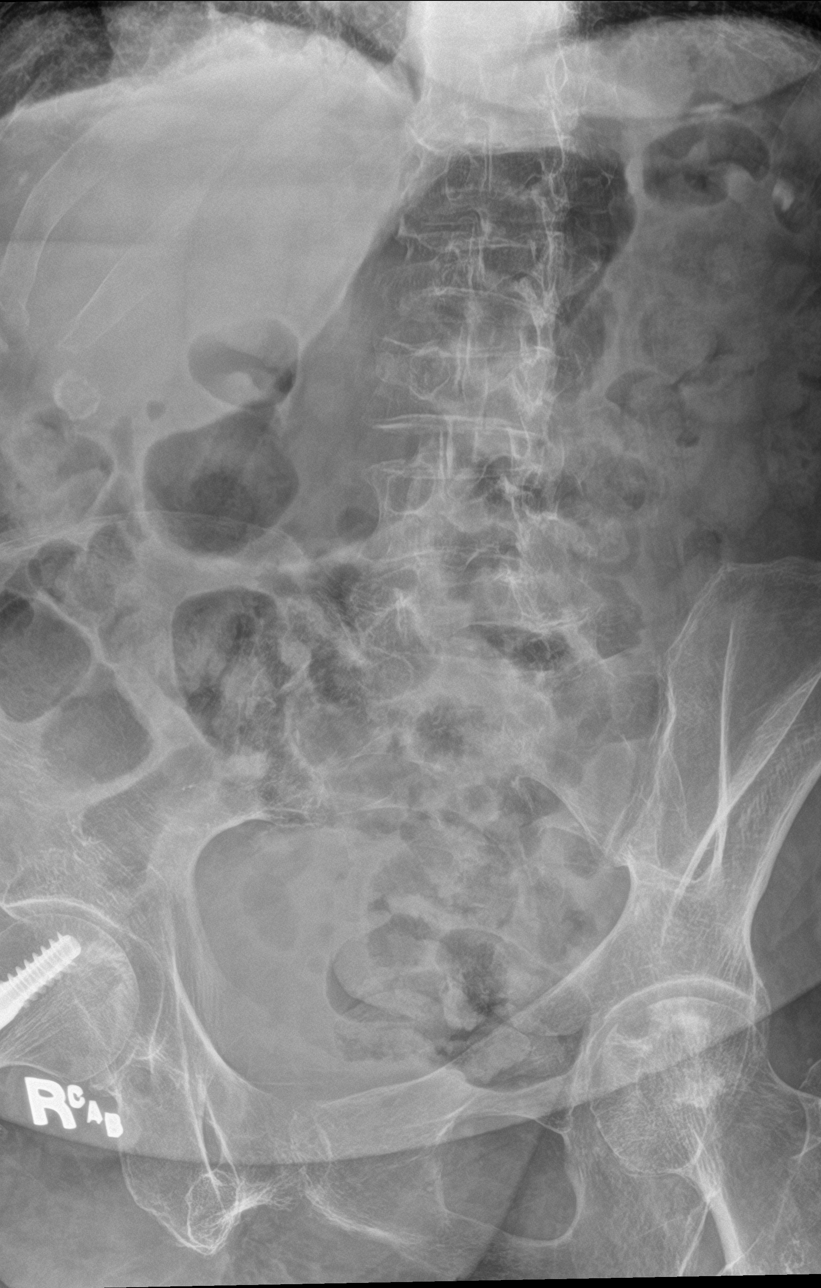

[l-spine obl (2 of 2)]
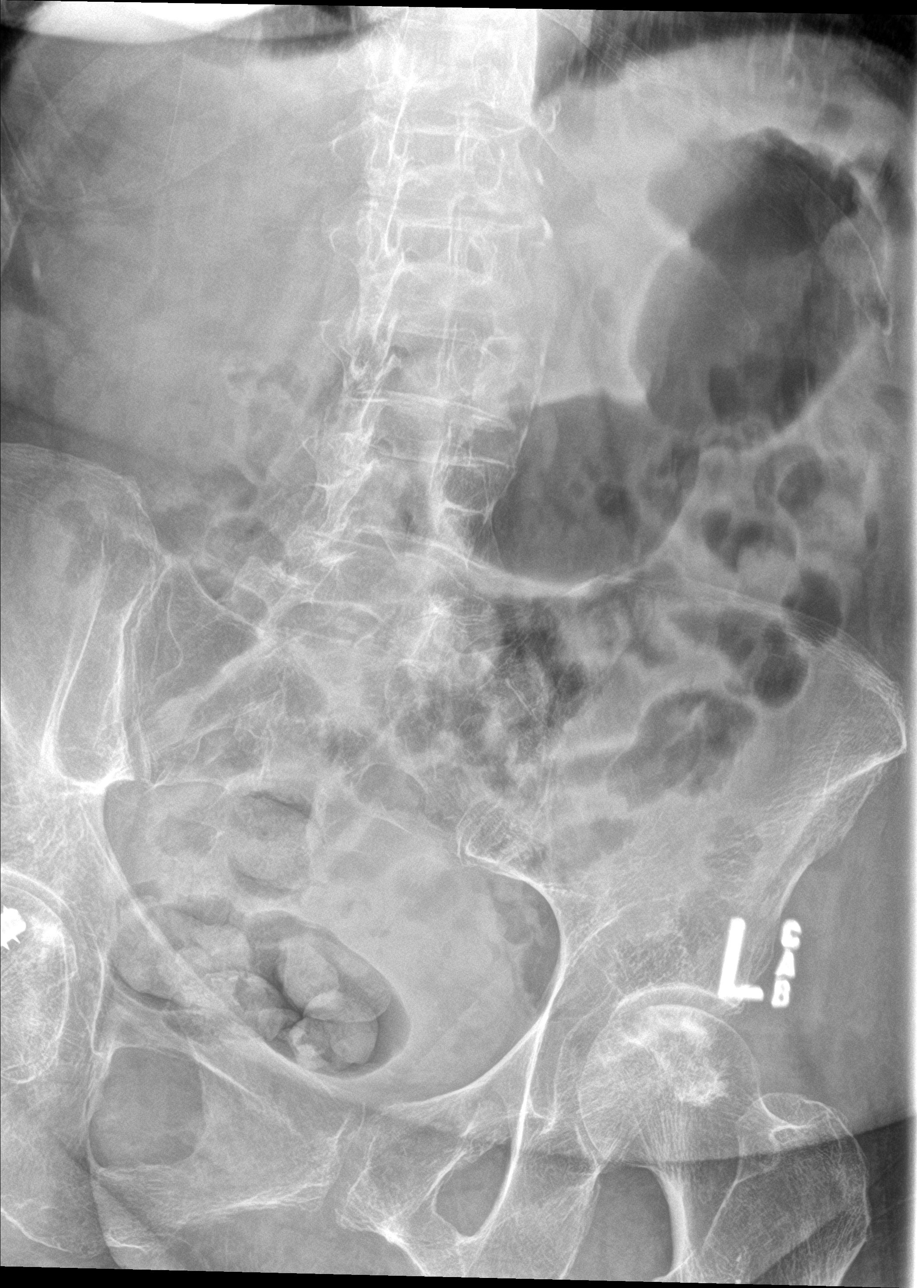

[l-spine lat]
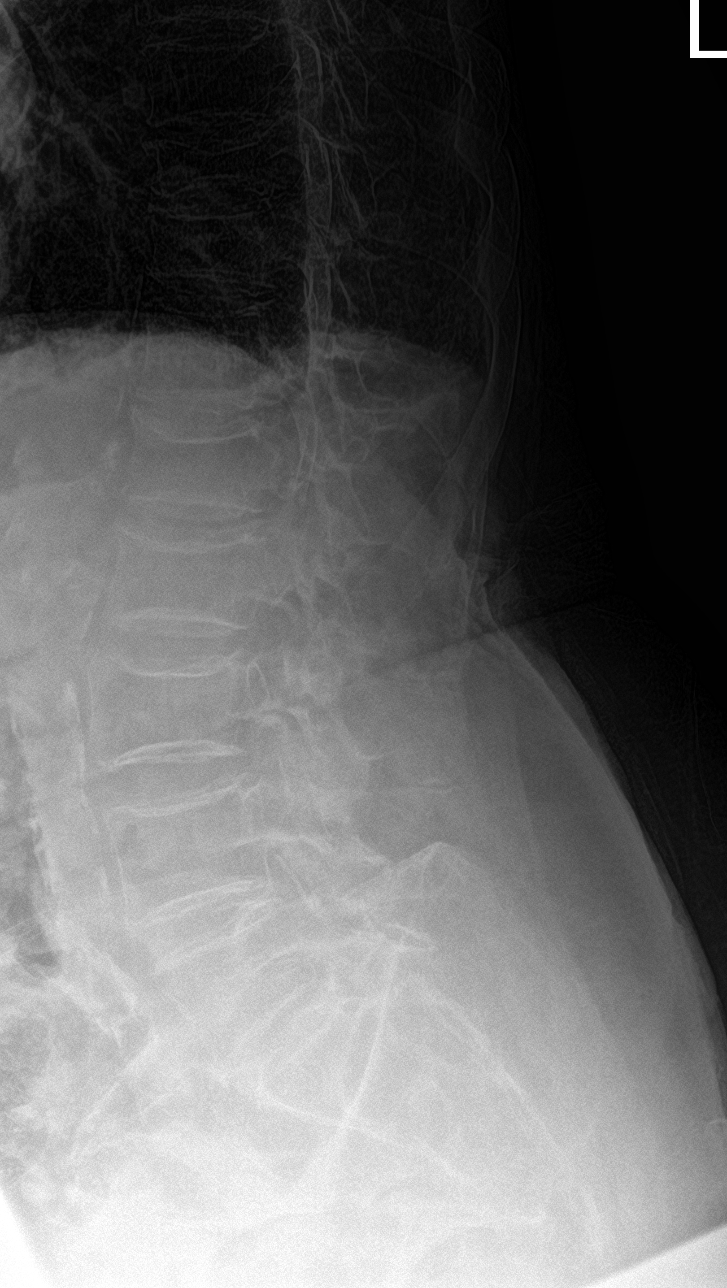

[4 of 4 positions shown; findings below may reference images not displayed]

FINDINGS: Diffuse osteopenia is noted. No spondylolisthesis is noted.
Atherosclerosis of abdominal aorta is noted. Stable old L1 and L2
compression fractures are noted. Mild compression deformity of L3
vertebral body is noted consistent with fracture of indeterminate
age. No spondylolisthesis is noted.
IMPRESSION: Diffuse osteopenia. Old L1 and L2 compression fractures are noted.
Interval mild compression deformity of L3 vertebral body consistent
with fracture of indeterminate age; MRI may be performed further
evaluation.

Aortic Atherosclerosis ([id number]).

## 2020-02-20 IMAGING — DX PORTABLE CHEST - 1 VIEW
1 series · 1 of 1 positions shown · non-contrast
Comparison: 11/17/2018

CLINICAL DATA: Sepsis

EXAM:
PORTABLE CHEST 1 VIEW

[chest ap]
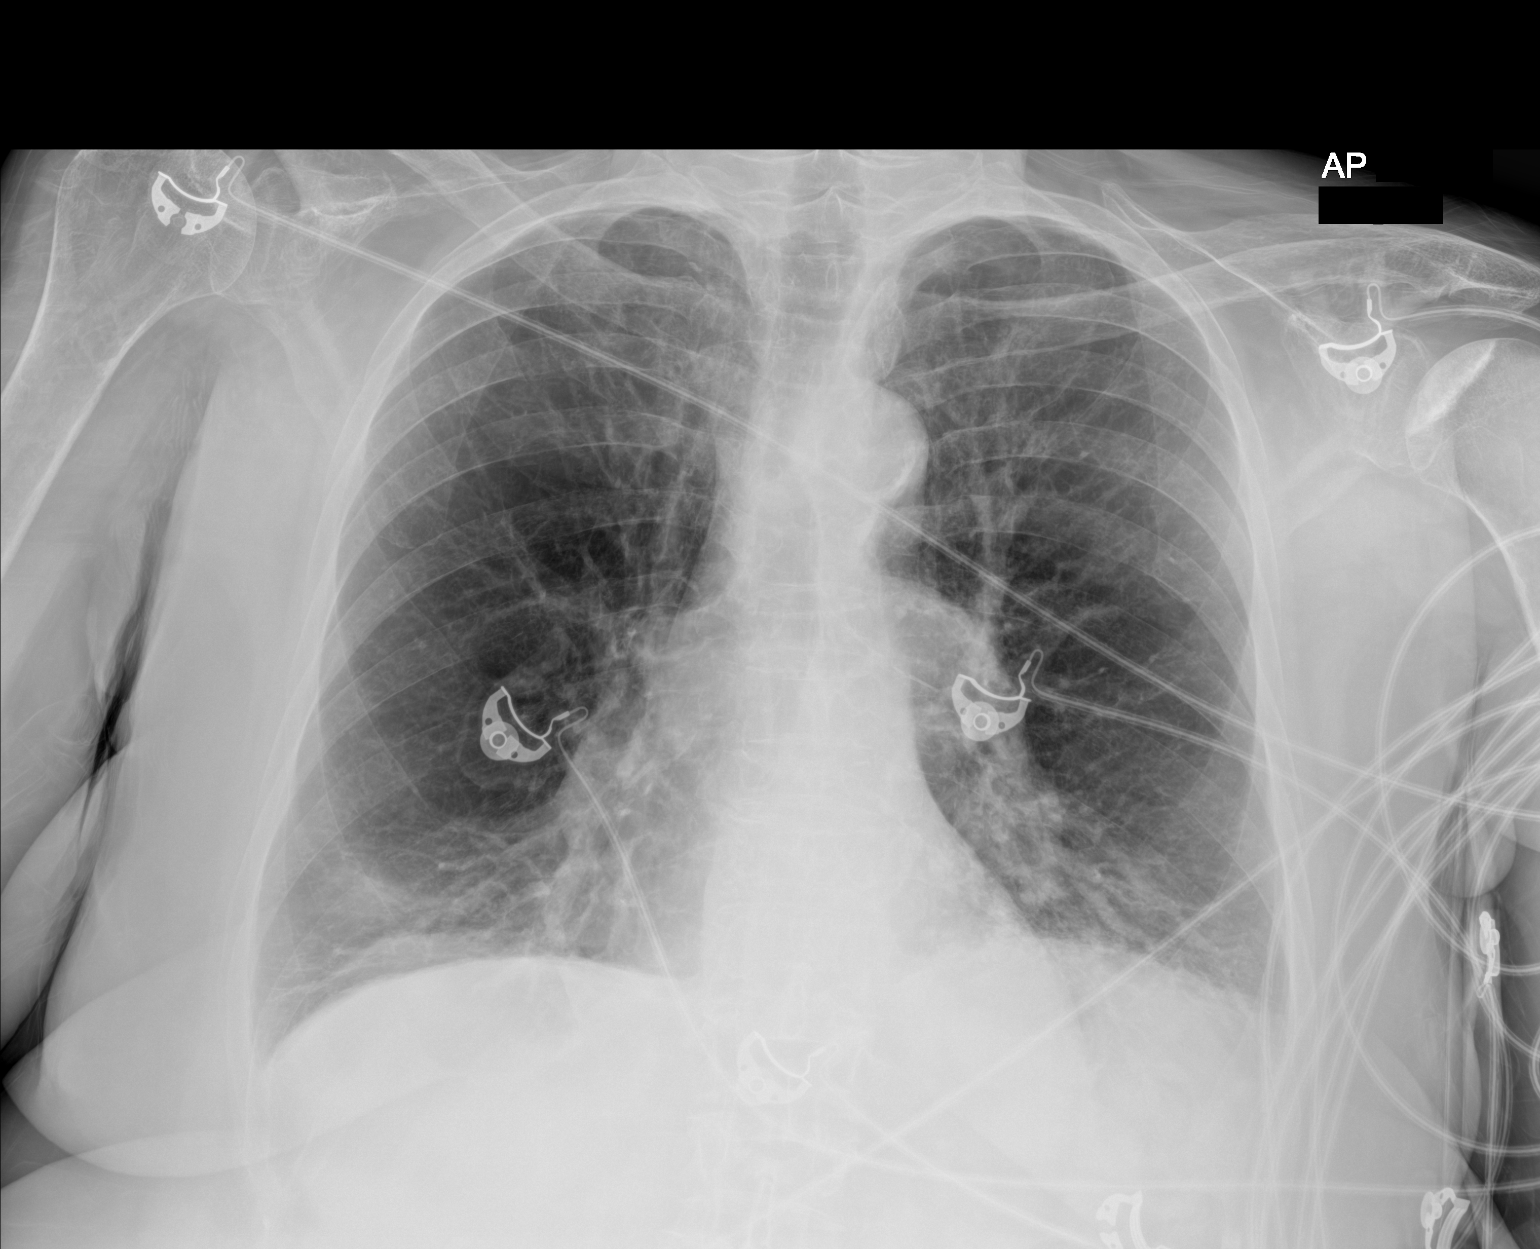

[1 of 1 positions shown; findings below may reference images not displayed]

FINDINGS: Streaky density in the lower lungs where airways appear thickened.
Normal heart size and mediastinal contours. Emphysema. No effusion
or pneumothorax
IMPRESSION: 1. Lower lung opacities with streaky appearance favoring
atelectasis. There may be underlying bronchitis/bronchopneumonia.
2. Emphysema.
